# Patient Record
Sex: Male | Born: 1935 | Race: White | Hispanic: No | Marital: Married | Smoking: Former smoker
Health system: Southern US, Community
[De-identification: ages and names within clinical notes are randomized; demographics above are authoritative.]

## PROBLEM LIST (undated history)

## (undated) DIAGNOSIS — R001 Bradycardia, unspecified: Secondary | ICD-10-CM

## (undated) DIAGNOSIS — I4891 Unspecified atrial fibrillation: Secondary | ICD-10-CM

## (undated) DIAGNOSIS — E039 Hypothyroidism, unspecified: Secondary | ICD-10-CM

## (undated) DIAGNOSIS — J479 Bronchiectasis, uncomplicated: Secondary | ICD-10-CM

## (undated) DIAGNOSIS — J449 Chronic obstructive pulmonary disease, unspecified: Secondary | ICD-10-CM

## (undated) DIAGNOSIS — C3410 Malignant neoplasm of upper lobe, unspecified bronchus or lung: Secondary | ICD-10-CM

## (undated) DIAGNOSIS — M549 Dorsalgia, unspecified: Secondary | ICD-10-CM

## (undated) DIAGNOSIS — C3490 Malignant neoplasm of unspecified part of unspecified bronchus or lung: Secondary | ICD-10-CM

## (undated) DIAGNOSIS — M542 Cervicalgia: Secondary | ICD-10-CM

## (undated) DIAGNOSIS — J189 Pneumonia, unspecified organism: Secondary | ICD-10-CM

## (undated) HISTORY — DX: Hypothyroidism, unspecified: E03.9

## (undated) HISTORY — DX: Chronic obstructive pulmonary disease, unspecified: J44.9

## (undated) HISTORY — DX: Unspecified atrial fibrillation: I48.91

## (undated) HISTORY — PX: CATARACT EXTRACTION, BILATERAL: SHX1313

## (undated) HISTORY — DX: Malignant neoplasm of upper lobe, unspecified bronchus or lung: C34.10

## (undated) HISTORY — DX: Cervicalgia: M54.2

## (undated) HISTORY — DX: Dorsalgia, unspecified: M54.9

## (undated) HISTORY — PX: LUNG REMOVAL, PARTIAL: SHX233

## (undated) HISTORY — DX: Bradycardia, unspecified: R00.1

## (undated) HISTORY — DX: Bronchiectasis, uncomplicated: J47.9

## (undated) HISTORY — DX: Malignant neoplasm of unspecified part of unspecified bronchus or lung: C34.90

## (undated) HISTORY — DX: Pneumonia, unspecified organism: J18.9

---

## 1993-09-05 HISTORY — PX: PACEMAKER INSERTION: SHX728

## 2001-03-19 ENCOUNTER — Encounter: Payer: Self-pay | Admitting: Internal Medicine

## 2007-11-14 DIAGNOSIS — Z9889 Other specified postprocedural states: Secondary | ICD-10-CM

## 2007-11-14 HISTORY — DX: Other specified postprocedural states: Z98.890

## 2007-11-14 HISTORY — PX: COLONOSCOPY: SHX174

## 2008-06-20 ENCOUNTER — Encounter: Payer: Self-pay | Admitting: Internal Medicine

## 2009-03-11 ENCOUNTER — Encounter: Payer: Self-pay | Admitting: Internal Medicine

## 2009-09-28 ENCOUNTER — Ambulatory Visit: Payer: Self-pay | Admitting: Internal Medicine

## 2009-09-28 DIAGNOSIS — E785 Hyperlipidemia, unspecified: Secondary | ICD-10-CM | POA: Insufficient documentation

## 2009-09-28 DIAGNOSIS — I495 Sick sinus syndrome: Secondary | ICD-10-CM | POA: Insufficient documentation

## 2009-09-28 DIAGNOSIS — Z950 Presence of cardiac pacemaker: Secondary | ICD-10-CM

## 2009-09-28 DIAGNOSIS — I498 Other specified cardiac arrhythmias: Secondary | ICD-10-CM

## 2009-10-01 ENCOUNTER — Encounter: Payer: Self-pay | Admitting: Internal Medicine

## 2010-02-03 ENCOUNTER — Ambulatory Visit: Payer: Self-pay | Admitting: Cardiology

## 2010-02-03 ENCOUNTER — Encounter: Payer: Self-pay | Admitting: Internal Medicine

## 2010-08-24 ENCOUNTER — Ambulatory Visit: Payer: Self-pay | Admitting: Internal Medicine

## 2010-10-05 NOTE — Letter (Signed)
Summary: PROGRESS NOTES FROM [place]  PROGRESS NOTES FROM [place]   Imported By: Faythe Ghee 09/28/2009 15:00:48  _____________________________________________________________________  External Attachment:    Type:   Image     Comment:   External Document

## 2010-10-05 NOTE — Letter (Signed)
Summary: PFT  PFT   Imported By: Faythe Ghee 09/28/2009 14:59:10  _____________________________________________________________________  External Attachment:    Type:   Image     Comment:   External Document

## 2010-10-05 NOTE — Letter (Signed)
Summary: ECHO  ECHO   Imported By: Faythe Ghee 09/28/2009 14:59:41  _____________________________________________________________________  External Attachment:    Type:   Image     Comment:   External Document

## 2010-10-05 NOTE — Letter (Signed)
Summary: LABS  LABS   Imported By: Faythe Ghee 09/28/2009 15:00:09  _____________________________________________________________________  External Attachment:    Type:   Image     Comment:   External Document

## 2010-10-05 NOTE — Cardiovascular Report (Signed)
Summary: Office Visit   Office Visit   Imported By: Roderic Ovens 02/27/2010 12:02:19  _____________________________________________________________________  External Attachment:    Type:   Image     Comment:   External Document

## 2010-10-05 NOTE — Letter (Signed)
Summary: EKG  EKG   Imported By: Faythe Ghee 09/28/2009 14:57:19  _____________________________________________________________________  External Attachment:    Type:   Image     Comment:   External Document

## 2010-10-05 NOTE — Assessment & Plan Note (Signed)
Summary: **NP6-MOVED FROM [place] NEEDS TO GET PRIMARY HERE   Visit Type:  Initial Consult Primary Provider:  DR.ZACH HALL   History of Present Illness: [name] is referred today by Dr. Margo Aye for evaluation of and ongoing treatment of sinus bradycardia, dyslipidemia, s/p PPM.  He has moved to [facility] from [place], [place].  The patient has a h/o bradycardia and is s/p PPM in 1995.  He had sudden battery depletion and subsequent development of CHF symptoms several yrs ago and now reports that he is doing well.  He denies c/p, sob, or peripheral edema. No cough, weight loss or GI complaints.  He exercises regularly as weather permits.  Current Medications (verified): 1)  Synthroid 100 Mcg Tabs (Levothyroxine Sodium) .... Take 1 Tab Daily 2)  Vitamin E 100 Unit Caps (Vitamin E) .... Take 1 Tab Daily 3)  Vitamin C 100 Mg Tabs (Ascorbic Acid) .... Take 1 Tab Daily 4)  Daily Multiple Vitamins  Tabs (Multiple Vitamin) .... Take 1 Tab Daily 5)  Fish Oil Concentrate 1000 Mg Caps (Omega-3 Fatty Acids) .... Take 1 Cap Daily  Allergies (verified): No Known Drug Allergies  Past History:  Past Medical History: Pancoast tumor, s/p XRT and surgical removal 1990. Dyslipidemia symptomatic brady s/p PPM 1995  Past Surgical History: Lung resection PPM insertion  Family History: N/C  Social History: Married and retired Longstanding tobacco stopped 20 yrs ago No etoH abuse.  Review of Systems       all systems reviewed and negative except as noted in the HPI.  Vital Signs:  Patient profile:   77 year old male Height:      73 inches Weight:      192 pounds BMI:     25.42 Pulse rate:   77 / minute BP sitting:   133 / 82  (right arm)  Vitals Entered By: Dreama Saa, CNA (September 28, 2009 9:29 AM)  Physical Exam  General:  Well developed, well nourished, in no acute distress.  Looks younger than stated age. HEENT: normal Neck: supple. No JVD. Carotids 2+ bilaterally no  bruits Cor: RRR no rubs, gallops or murmur Lungs: CTA. No wheezes, rales, or rhonchi Well healed right sided thoracotomy incision. Ab: soft, nontender. nondistended. No HSM. Good bowel sounds Ext: warm. no cyanosis, clubbing or edema Neuro: alert and oriented. Grossly nonfocal. affect pleasant    PPM Specifications Following MD:  Lewayne Bunting, MD     PPM Vendor:  Medtronic     PPM Model Number:  [id number]     PPM Serial Number:  [id number] H PPM DOI:  03/19/2009     PPM Implanting MD:  NOT IMPLANTED BY [place]  Lead 1    Location: RV     DOI: 01/28/1994     Model #: [id number]     Status: active Lead 2    Location: RA     DOI: 01/28/1994     Model #: 1222T     Status: active  Magnet Response Rate:  BOL 85 ERI  65  Indications:  Sick sinus syndrome   PPM Follow Up Remote Check?  No Battery Voltage:  2.8 V     Battery Est. Longevity:  8 years     Pacer Dependent:  Yes       PPM Device Measurements Atrium  Amplitude: 2.8 mV, Impedance: 610 ohms, Threshold: 1.125 V at 0.4 msec Right Ventricle  Amplitude: 5.6 mV, Impedance: 546 ohms, Threshold: 1.25 V at 1.0 msec  Episodes MS  Episodes:  31     Percent Mode Switch:  <0.1%     Coumadin:  No Ventricular High Rate:  1     Atrial Pacing:  89.8%     Ventricular Pacing:  3.5%  Parameters Mode:  DDDR+     Lower Rate Limit:  60     Upper Rate Limit:  130 Paced AV Delay:  150     Sensed AV Delay:  120 Next Cardiology Appt Due:  12/04/2009 Tech Comments:  RV 2.5@1 .0.  No Carelink @ this time.  Device function normal.  ROV 3 months [facility] clinic. Altha Harm, LPN  September 28, 2009 9:52 AM  MD Comments:  Agree with above.  Impression & Recommendations:  Problem # 1:  CARDIAC PACEMAKER IN SITU (ICD-V45.01) His device is working normally today.  Will recheck in several months.  Problem # 2:  DYSLIPIDEMIA (ICD-272.4) He is not on any cholesterol lowering meds.  Continue a low fat diet.

## 2010-10-05 NOTE — Procedures (Signed)
Summary: 3 MTH F/U PER CHECKOUT ON 09/28/09/TG   Current Medications (verified): 1)  Synthroid 100 Mcg Tabs (Levothyroxine Sodium) .... Take 1 Tab Daily 2)  Vitamin E 100 Unit Caps (Vitamin E) .... Take 1 Tab Daily 3)  Vitamin C 100 Mg Tabs (Ascorbic Acid) .... Take 1 Tab Daily 4)  Daily Multiple Vitamins  Tabs (Multiple Vitamin) .... Take 1 Tab Daily 5)  Fish Oil Concentrate 1000 Mg Caps (Omega-3 Fatty Acids) .... Take 1 Cap Daily  Allergies (verified): No Known Drug Allergies   PPM Specifications Following MD:  Lewayne Bunting, MD     PPM Vendor:  Medtronic     PPM Model Number:  [id number]     PPM Serial Number:  [id number] H PPM DOI:  03/19/2009     PPM Implanting MD:  NOT IMPLANTED BY [place]  Lead 1    Location: RV     DOI: 01/28/1994     Model #: [id number]     Status: active Lead 2    Location: RA     DOI: 01/28/1994     Model #: [id number]     Status: active  Magnet Response Rate:  BOL 85 ERI  65  Indications:  Sick sinus syndrome   PPM Follow Up Remote Check?  No Battery Voltage:  2.80 V     Battery Est. Longevity:  9.5 years     Pacer Dependent:  Yes       PPM Device Measurements Atrium  Amplitude: 4.0 mV, Impedance: 641 ohms, Threshold: 1.0 V at 0.4 msec Right Ventricle  Amplitude: 5.6 mV, Impedance: 529 ohms, Threshold: 1.75 V at 0.76 msec  Episodes MS Episodes:  21     Percent Mode Switch:  <0.1%     Coumadin:  No Ventricular High Rate:  3     Atrial Pacing:  86%     Ventricular Pacing:  4.5%  Parameters Mode:  DDDR+     Lower Rate Limit:  60     Upper Rate Limit:  130 Paced AV Delay:  150     Sensed AV Delay:  120 Next Cardiology Appt Due:  08/05/2010 Tech Comments:  Outputs reprogrammed for threshold values.  The longest a-fib episode lasted 1:07 hours, - coumadin.  3 VHR episodes lasting up to 4 seconds.  No Carelink @ this time.  ROV 6 months with Dr. Ladona Ridgel in [facility]. Altha Harm, LPN  February 03, 4097 9:40 AM

## 2010-10-07 NOTE — Procedures (Signed)
Summary: Robert [name]   Primary Provider:  DR.ZACH HALL  CC:  No cardiac complaints at this time.Marland Kitchen  History of Present Illness: Robert [name] returns today for followup.  He is a pleasant 77 yo man with a h/o symptomatic bradycardia who underwent PPM initially in 1995.  The patient underwent generator change in 2010.  No other complaints today.  He remains active without chest pain or sob.  Preventive Screening-Counseling & Management  Alcohol-Tobacco     Smoking Status: never  Current Medications (verified): 1)  Synthroid 100 Mcg Tabs (Levothyroxine Sodium) .... Take 1 Tab Daily 2)  Daily Multiple Vitamins  Tabs (Multiple Vitamin) .... Take 1 Tab Daily 3)  Fish Oil Concentrate 1000 Mg Caps (Omega-3 Fatty Acids) .... Take 1 Cap Daily  Allergies (verified): No Known Drug Allergies  Past History:  Past Medical History: Last updated: 09/28/2009 Pancoast tumor, s/p XRT and surgical removal 1990. Dyslipidemia symptomatic brady s/p PPM 1995  Past Surgical History: Last updated: 09/28/2009 Lung resection PPM insertion BILATERIAL CATARACT SURGERY  Social History: Smoking Status:  never  Review of Systems  The patient denies chest pain, syncope, dyspnea on exertion, and peripheral edema.    Vital Signs:  Patient profile:   77 year old male Height:      72 inches Weight:      202 pounds BMI:     27.50 O2 Sat:      95 % on Room air Pulse rate:   76 / minute BP sitting:   127 / 79  (left arm)  Vitals Entered ByLarita Fife Via LPN (August 24, 2010 3:24 PM)  O2 Flow:  Room air  Physical Exam  General:  Well developed, well nourished, in no acute distress.  Looks younger than stated age. HEENT: normal Neck: supple. No JVD. Carotids 2+ bilaterally no bruits Cor: RRR no rubs, gallops or murmur Lungs: CTA. No wheezes, rales, or rhonchi Well healed right sided thoracotomy incision. Ab: soft, nontender. nondistended. No HSM. Good bowel sounds Ext: warm. no cyanosis, clubbing or  edema Neuro: alert and oriented. Grossly nonfocal. affect pleasant    PPM Specifications Following MD:  Lewayne Bunting, MD     PPM Vendor:  Medtronic     PPM Model Number:  ADDRO1     PPM Serial Number:  [id number] H PPM DOI:  03/19/2009     PPM Implanting MD:  NOT IMPLANTED BY [place]  Lead 1    Location: RV     DOI: 01/28/1994     Model #: 1226T     Status: active Lead 2    Location: RA     DOI: 01/28/1994     Model #: 1222T     Status: active  Magnet Response Rate:  BOL 85 ERI  65  Indications:  Sick sinus syndrome   PPM Follow Up Remote Check?  No Battery Voltage:  2.80 V     Battery Est. Longevity:  10.5 years     Pacer Dependent:  Yes       PPM Device Measurements Atrium  Amplitude: 4.0 mV, Impedance: 619 ohms, Threshold: 1.125 V at 0.4 msec Right Ventricle  Amplitude: 5.6 mV, Impedance: 521 ohms, Threshold: 1.5 V at 1.0 msec  Episodes MS Episodes:  31     Percent Mode Switch:  <0.1%     Coumadin:  No Ventricular High Rate:  4     Atrial Pacing:  94.8%     Ventricular Pacing:  2.6%  Parameters Mode:  DDDR+  Lower Rate Limit:  60     Upper Rate Limit:  130 Paced AV Delay:  150     Sensed AV Delay:  120 Next Cardiology Appt Due:  02/04/2011 Tech Comments:  RV reprogrammed 3.0@1 .0 for increased ventricular threshold.  Device function normal.  No Carelink @ this time.  ROV 6 months [facility] clinic. Altha Harm, LPN  August 24, 2010 4:21 PM  MD Comments:  Agree with above.  Impression & Recommendations:  Problem # 1:  CARDIAC PACEMAKER IN SITU (ICD-V45.01) His device is working well thought his RV threshold is increased.  Will follow as he is not pacing much in the RV.  Problem # 2:  DYSLIPIDEMIA (ICD-272.4) I have asked him to continue exercising regularly and maintain a low fat diet.  Prevention & Chronic Care Immunizations   Influenza vaccine: Not documented    Tetanus booster: Not documented    Pneumococcal vaccine: Not documented    H. zoster vaccine: Not  documented  Colorectal Screening   Hemoccult: Not documented    Colonoscopy: Not documented  Other Screening   PSA: Not documented   Smoking status: never  (08/24/2010)  Lipids   Total Cholesterol: Not documented   LDL: Not documented   LDL Direct: Not documented   HDL: Not documented   Triglycerides: Not documented    SGOT (AST): Not documented   SGPT (ALT): Not documented   Alkaline phosphatase: Not documented   Total bilirubin: Not documented  Self-Management Support :    Lipid self-management support: Not documented

## 2010-10-07 NOTE — Cardiovascular Report (Signed)
Summary: Office Visit   Office Visit   Imported By: Roderic Ovens 09/01/2010 11:23:39  _____________________________________________________________________  External Attachment:    Type:   Image     Comment:   External Document

## 2010-10-09 ENCOUNTER — Encounter: Payer: Self-pay | Admitting: Internal Medicine

## 2010-10-13 NOTE — Miscellaneous (Signed)
Summary: corrected device information  Clinical Lists Changes  Observations: Added new observation of PPMLEADSER2: [id number]  (10/09/2010 10:07) Added new observation of PPMLEADSER1: [id number]  (10/09/2010 10:07)      PPM Specifications Following MD:  Lewayne Bunting, MD     PPM Vendor:  Medtronic     PPM Model Number:  ADDRO1     PPM Serial Number:  [id number] H PPM DOI:  03/19/2009     PPM Implanting MD:  NOT IMPLANTED BY [place]  Lead 1    Location: RV     DOI: 01/28/1994     Model #: 1226T     Serial #: [id number]     Status: active Lead 2    Location: RA     DOI: 01/28/1994     Model #: [id number]     Serial #: [id number]     Status: active  Magnet Response Rate:  BOL 85 ERI  65  Indications:  Sick sinus syndrome   PPM Follow Up Pacer Dependent:  Yes      Episodes Coumadin:  No  Parameters Mode:  DDDR+     Lower Rate Limit:  60     Upper Rate Limit:  130 Paced AV Delay:  150     Sensed AV Delay:  120

## 2011-04-21 ENCOUNTER — Ambulatory Visit (INDEPENDENT_AMBULATORY_CARE_PROVIDER_SITE_OTHER): Payer: BC Managed Care – PPO | Admitting: *Deleted

## 2011-04-21 DIAGNOSIS — I495 Sick sinus syndrome: Secondary | ICD-10-CM

## 2011-08-23 ENCOUNTER — Encounter: Payer: Self-pay | Admitting: Internal Medicine

## 2011-09-08 ENCOUNTER — Encounter: Payer: Self-pay | Admitting: Internal Medicine

## 2011-10-13 ENCOUNTER — Encounter: Payer: Self-pay | Admitting: Internal Medicine

## 2011-10-13 ENCOUNTER — Ambulatory Visit (INDEPENDENT_AMBULATORY_CARE_PROVIDER_SITE_OTHER): Payer: BC Managed Care – PPO | Admitting: Internal Medicine

## 2011-10-13 VITALS — BP 116/75 | HR 68 | Ht 73.0 in | Wt 202.0 lb

## 2011-10-13 DIAGNOSIS — E785 Hyperlipidemia, unspecified: Secondary | ICD-10-CM

## 2011-10-13 DIAGNOSIS — Z950 Presence of cardiac pacemaker: Secondary | ICD-10-CM

## 2011-10-13 DIAGNOSIS — I498 Other specified cardiac arrhythmias: Secondary | ICD-10-CM

## 2011-10-13 LAB — PACEMAKER DEVICE OBSERVATION
AL AMPLITUDE: 4 mv
BAMS-0001: 175 {beats}/min
RV LEAD AMPLITUDE: 5.6 mv
RV LEAD THRESHOLD: 1.5 V

## 2011-10-13 NOTE — Assessment & Plan Note (Signed)
His device is working normally. We'll plan to recheck in several months. 

## 2011-10-13 NOTE — Assessment & Plan Note (Signed)
He is instructed to continue his current medical therapy and maintain a low-fat diet.

## 2011-10-13 NOTE — Progress Notes (Signed)
HPI Mr. Dileonardo returns today for followup. He is a very pleasant 77 -year-old man with a history of symptomatic bradycardia status post permanent pacemaker insertion. He has a history of lung cancer status post resection. In the interim, he has done well. He denies chest pain, shortness of breath, or peripheral edema. He remains active walking almost daily. No syncope. No Known Allergies   Current Outpatient Prescriptions  Medication Sig Dispense Refill  . celecoxib (CELEBREX) 200 MG capsule Take 200 mg by mouth as needed.        . fish oil-omega-3 fatty acids 1000 MG capsule Take 1 capsule by mouth daily.        Marland Kitchen levothyroxine (SYNTHROID, LEVOTHROID) 100 MCG tablet Take 100 mcg by mouth daily.        . Multiple Vitamin (MULTIVITAMIN) tablet Take 1 tablet by mouth daily.           Past Medical History  Diagnosis Date  . 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s/p XRT and surgical removal 1990  . Dyslipidemia   . Symptomatic bradycardia     s/p PPM 1995    ROS:   All systems reviewed and negative except as noted in the HPI.   Past Surgical History  Procedure Date  . Lung removal, partial   . Pacemaker insertion 1995  . Cataract extraction, bilateral      Family History  Problem Relation Age of Onset  . Arrhythmia Father     Atrial fibrillation  . Arrhythmia Sister     Hx of atrial myxoma  . Diabetes Brother     Insulin dependent  . Breast cancer Sister   . Breast cancer Sister      History   Social History  . Marital Status: Married    Spouse Name: N/A    Number of Children: N/A  . Years of Education: N/A   Occupational History  . Retired    Social History Main Topics  . Smoking status: Former Smoker    Quit date: 09/05/1988  . Smokeless tobacco: Not on file  . Alcohol Use: 3.5 oz/week    7 drink(s) per week     1 drink daily  . Drug Use: Not on file  . Sexually Active: Not on file   Other Topics Concern  . Not on file   Social History Narrative   Married      BP 116/75  Pulse 68  Ht 6\' 1"  (1.854 m)  Wt 91.627 kg (202 lb)  BMI 26.65 kg/m2  Physical Exam:  Well appearing 78 year old man, NAD HEENT: Unremarkable Neck:  No JVD, no thyromegally Lungs:  Clear with no wheezes, rales, or rhonchi. Well-healed pacemaker incision. HEART:  Regular rate rhythm, no murmurs, no rubs, no clicks Abd:  soft, positive bowel sounds, no organomegally, no rebound, no guarding Ext:  2 plus pulses, no edema, no cyanosis, no clubbing Skin:  No rashes no nodules Neuro:  CN II through XII intact, motor grossly intact  DEVICE  Normal device function.  See PaceArt for details.   Assess/Plan:

## 2011-10-13 NOTE — Patient Instructions (Signed)
Your physician recommends that you schedule a follow-up appointment in: 12 months.  

## 2012-05-18 ENCOUNTER — Encounter: Payer: Self-pay | Admitting: *Deleted

## 2012-05-30 ENCOUNTER — Ambulatory Visit (INDEPENDENT_AMBULATORY_CARE_PROVIDER_SITE_OTHER): Payer: BC Managed Care – PPO | Admitting: *Deleted

## 2012-05-30 ENCOUNTER — Encounter: Payer: Self-pay | Admitting: Internal Medicine

## 2012-05-30 DIAGNOSIS — I498 Other specified cardiac arrhythmias: Secondary | ICD-10-CM

## 2012-05-30 DIAGNOSIS — Z950 Presence of cardiac pacemaker: Secondary | ICD-10-CM

## 2012-05-30 LAB — PACEMAKER DEVICE OBSERVATION
AL AMPLITUDE: 2 mv
AL IMPEDENCE PM: 651 Ohm
BATTERY VOLTAGE: 2.8 V
RV LEAD AMPLITUDE: 8 mv
RV LEAD IMPEDENCE PM: 544 Ohm
VENTRICULAR PACING PM: 1

## 2012-05-30 NOTE — Progress Notes (Signed)
Pacer check in clinic  

## 2012-07-05 ENCOUNTER — Telehealth: Payer: Self-pay | Admitting: Internal Medicine

## 2012-07-05 NOTE — Telephone Encounter (Signed)
Pt wife is calling, states that at last vist to have device checked christen told them he would need to see a doctor that they might want to to start him on coumadin for aflutter. She was told that he would get a letter. They never received any letter

## 2012-07-06 ENCOUNTER — Telehealth: Payer: Self-pay | Admitting: Pharmacist

## 2012-07-06 ENCOUNTER — Telehealth: Payer: Self-pay | Admitting: *Deleted

## 2012-07-06 MED ORDER — WARFARIN SODIUM 5 MG PO TABS: 5.0000 mg | ORAL_TABLET | Freq: Every day | ORAL | Status: DC

## 2012-07-06 NOTE — Telephone Encounter (Signed)
See telephone note in regards to pt starting Coumadin/kwm

## 2012-07-06 NOTE — Telephone Encounter (Signed)
Per note from last check----pt to start Coumadin and set up appt with GT in 4-6 weeks in [facility] office. Pt's wife aware of this. Kennon Rounds to call pt and get started on Coumadin and Melissa to call and schedule appt with GT.

## 2012-07-06 NOTE — Telephone Encounter (Signed)
Called and spoke with pt's wife.  We will start Coumadin today per Dr. Ladona Ridgel for atrial flutter and then set pt up in the [place] office next Wednesday.

## 2012-07-11 ENCOUNTER — Ambulatory Visit (INDEPENDENT_AMBULATORY_CARE_PROVIDER_SITE_OTHER): Payer: BC Managed Care – PPO | Admitting: *Deleted

## 2012-07-11 ENCOUNTER — Encounter: Payer: Self-pay | Admitting: Internal Medicine

## 2012-07-11 DIAGNOSIS — I4892 Unspecified atrial flutter: Secondary | ICD-10-CM

## 2012-07-11 DIAGNOSIS — I4891 Unspecified atrial fibrillation: Secondary | ICD-10-CM | POA: Insufficient documentation

## 2012-07-11 DIAGNOSIS — Z7901 Long term (current) use of anticoagulants: Secondary | ICD-10-CM

## 2012-07-11 LAB — POCT INR: INR: 1.5

## 2012-07-18 ENCOUNTER — Ambulatory Visit (INDEPENDENT_AMBULATORY_CARE_PROVIDER_SITE_OTHER): Payer: BC Managed Care – PPO | Admitting: *Deleted

## 2012-07-18 DIAGNOSIS — Z7901 Long term (current) use of anticoagulants: Secondary | ICD-10-CM

## 2012-07-18 DIAGNOSIS — I4892 Unspecified atrial flutter: Secondary | ICD-10-CM

## 2012-07-18 LAB — POCT INR: INR: 2.9

## 2012-07-25 ENCOUNTER — Ambulatory Visit (INDEPENDENT_AMBULATORY_CARE_PROVIDER_SITE_OTHER): Payer: BC Managed Care – PPO | Admitting: *Deleted

## 2012-07-25 DIAGNOSIS — Z7901 Long term (current) use of anticoagulants: Secondary | ICD-10-CM

## 2012-07-25 DIAGNOSIS — I4892 Unspecified atrial flutter: Secondary | ICD-10-CM

## 2012-07-27 ENCOUNTER — Other Ambulatory Visit: Payer: Self-pay | Admitting: Pharmacist

## 2012-07-27 MED ORDER — WARFARIN SODIUM 5 MG PO TABS: 5.0000 mg | ORAL_TABLET | Freq: Every day | ORAL | Status: DC

## 2012-08-06 ENCOUNTER — Ambulatory Visit (INDEPENDENT_AMBULATORY_CARE_PROVIDER_SITE_OTHER): Payer: BC Managed Care – PPO | Admitting: *Deleted

## 2012-08-06 DIAGNOSIS — I4892 Unspecified atrial flutter: Secondary | ICD-10-CM

## 2012-08-06 DIAGNOSIS — Z7901 Long term (current) use of anticoagulants: Secondary | ICD-10-CM

## 2012-08-13 ENCOUNTER — Ambulatory Visit (INDEPENDENT_AMBULATORY_CARE_PROVIDER_SITE_OTHER): Payer: BC Managed Care – PPO | Admitting: *Deleted

## 2012-08-13 ENCOUNTER — Ambulatory Visit (INDEPENDENT_AMBULATORY_CARE_PROVIDER_SITE_OTHER): Payer: BC Managed Care – PPO | Admitting: Internal Medicine

## 2012-08-13 ENCOUNTER — Encounter: Payer: Self-pay | Admitting: Internal Medicine

## 2012-08-13 VITALS — BP 114/74 | HR 78 | Ht 73.0 in | Wt 205.8 lb

## 2012-08-13 DIAGNOSIS — Z950 Presence of cardiac pacemaker: Secondary | ICD-10-CM

## 2012-08-13 DIAGNOSIS — Z7901 Long term (current) use of anticoagulants: Secondary | ICD-10-CM

## 2012-08-13 DIAGNOSIS — I4892 Unspecified atrial flutter: Secondary | ICD-10-CM

## 2012-08-13 LAB — POCT INR: INR: 3.1

## 2012-08-13 LAB — PACEMAKER DEVICE OBSERVATION
AL IMPEDENCE PM: 608 Ohm
ATRIAL PACING PM: 96
BAMS-0001: 175 {beats}/min
BATTERY VOLTAGE: 2.8 V
RV LEAD THRESHOLD: 2.25 V
VENTRICULAR PACING PM: 1

## 2012-08-13 NOTE — Assessment & Plan Note (Signed)
He is been out of rhythm, either fibrillation or flutter, less than 0.1% of the time. He is asymptomatic. He will continue chronic anticoagulation. No rate controlling drugs are needed at this time.

## 2012-08-13 NOTE — Patient Instructions (Signed)
Your physician recommends that you schedule a follow-up appointment in: 1 year with Dr Ladona Ridgel and 6 months with Gunnar Fusi

## 2012-08-13 NOTE — Assessment & Plan Note (Signed)
His Medtronic dual-chamber pacemaker is working satisfactorily. His ventricular pacing threshold remains chronically elevated. Today we preprogram his device to assure satisfactory safety margins.

## 2012-08-13 NOTE — Progress Notes (Signed)
HPI Mr. Crysler returns today for followup. He is a very pleasant 78 year old man with symptomatic bradycardia, status post permanent pacemaker insertion. He has sinus node dysfunction. He also is paroxysmal atrial fibrillation, which has been very well controlled. He denies chest pain or shortness of breath. No weight loss. He notes that he is actually gained several pounds. No peripheral edema, cough, or syncope. No Known Allergies   Current Outpatient Prescriptions  Medication Sig Dispense Refill  . celecoxib (CELEBREX) 200 MG capsule Take 200 mg by mouth as needed.        Marland Kitchen levothyroxine (SYNTHROID, LEVOTHROID) 100 MCG tablet Take 100 mcg by mouth daily.        . Multiple Vitamin (MULTIVITAMIN) tablet Take 1 tablet by mouth daily.        . vitamin C (ASCORBIC ACID) 500 MG tablet Take 500 mg by mouth daily.      Marland Kitchen warfarin (COUMADIN) 5 MG tablet Take 1 tablet (5 mg total) by mouth daily.  84 tablet  0     Past Medical History  Diagnosis Date  . 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s/p XRT and surgical removal 1990  . Dyslipidemia   . Symptomatic bradycardia     s/p PPM 1995    ROS:   All systems reviewed and negative except as noted in the HPI.   Past Surgical History  Procedure Date  . Lung removal, partial   . Pacemaker insertion 1995  . Cataract extraction, bilateral      Family History  Problem Relation Age of Onset  . Arrhythmia Father     Atrial fibrillation  . Diabetes Brother     Insulin dependent  . Breast cancer Sister   . Breast cancer Sister      History   Social History  . Marital Status: Married    Spouse Name: N/A    Number of Children: N/A  . Years of Education: N/A   Occupational History  . Retired    Social History Main Topics  . Smoking status: Former Smoker    Quit date: 09/05/1988  . Smokeless tobacco: Not on file  . Alcohol Use: 3.5 oz/week    7 drink(s) per week     Comment: 1 drink daily  . Drug Use: Not on file  . Sexually Active: Not on  file   Other Topics Concern  . Not on file   Social History Narrative   Married     BP 114/74  Pulse 78  Ht 6\' 1"  (1.854 m)  Wt 205 lb 12.8 oz (93.35 kg)  BMI 27.15 kg/m2  SpO2 98%  Physical Exam:  Well appearing 78 year old man, NAD HEENT: Unremarkable Neck:  No JVD, no thyromegally Lungs:  Clear with no wheezes, rales, or rhonchi. HEART:  Regular rate rhythm, no murmurs, no rubs, no clicks Abd:  soft, positive bowel sounds, no organomegally, no rebound, no guarding Ext:  2 plus pulses, no edema, no cyanosis, no clubbing Skin:  No rashes no nodules Neuro:  CN II through XII intact, motor grossly intact  DEVICE  Normal device function.  See PaceArt for details.   Assess/Plan:

## 2012-08-14 ENCOUNTER — Encounter: Payer: Self-pay | Admitting: Internal Medicine

## 2012-08-23 ENCOUNTER — Encounter: Payer: Self-pay | Admitting: Internal Medicine

## 2012-08-27 ENCOUNTER — Telehealth: Payer: Self-pay | Admitting: Internal Medicine

## 2012-08-27 ENCOUNTER — Ambulatory Visit (INDEPENDENT_AMBULATORY_CARE_PROVIDER_SITE_OTHER): Payer: BC Managed Care – PPO | Admitting: *Deleted

## 2012-08-27 DIAGNOSIS — Z7901 Long term (current) use of anticoagulants: Secondary | ICD-10-CM

## 2012-08-27 DIAGNOSIS — I4892 Unspecified atrial flutter: Secondary | ICD-10-CM

## 2012-08-27 NOTE — Telephone Encounter (Signed)
Spoke with pt.  He is to continue same coumadin dose as instructed this morning and keep INR check on 1 /13.  May have extra greens/salads while on Amoxicillin

## 2012-08-27 NOTE — Telephone Encounter (Signed)
PT WAS PLACED ON AMOXICILLIN

## 2012-09-17 ENCOUNTER — Ambulatory Visit (INDEPENDENT_AMBULATORY_CARE_PROVIDER_SITE_OTHER): Payer: BC Managed Care – PPO | Admitting: *Deleted

## 2012-09-17 DIAGNOSIS — I4892 Unspecified atrial flutter: Secondary | ICD-10-CM

## 2012-09-17 DIAGNOSIS — Z7901 Long term (current) use of anticoagulants: Secondary | ICD-10-CM

## 2012-09-17 LAB — POCT INR: INR: 1.6

## 2012-10-03 ENCOUNTER — Ambulatory Visit (INDEPENDENT_AMBULATORY_CARE_PROVIDER_SITE_OTHER): Payer: BC Managed Care – PPO | Admitting: *Deleted

## 2012-10-03 DIAGNOSIS — I4892 Unspecified atrial flutter: Secondary | ICD-10-CM

## 2012-10-03 DIAGNOSIS — Z7901 Long term (current) use of anticoagulants: Secondary | ICD-10-CM

## 2012-10-04 ENCOUNTER — Encounter: Payer: Self-pay | Admitting: Cardiology

## 2012-10-22 ENCOUNTER — Telehealth: Payer: Self-pay | Admitting: Internal Medicine

## 2012-10-22 NOTE — Telephone Encounter (Signed)
PT WIFE IS CALLING TO SEE IF YOU COULD GET WITH DR Ladona Ridgel AND SEE IF HE CAN BE PUT ON "OTHER PILLS" THEN COUMADIN.  IF WE COULD CALL THEM IN TO [facility] MAIL ORDER [phone]

## 2012-10-23 NOTE — Telephone Encounter (Signed)
Pt want to stop coumadin and start Xarelto or Eliquis.  Please advise.

## 2012-10-23 NOTE — Telephone Encounter (Signed)
Ok to start xarelto 20 mg daily, taken at night, three days after stopping coumadin.

## 2012-10-24 ENCOUNTER — Ambulatory Visit (INDEPENDENT_AMBULATORY_CARE_PROVIDER_SITE_OTHER): Payer: BC Managed Care – PPO | Admitting: *Deleted

## 2012-10-24 DIAGNOSIS — I4892 Unspecified atrial flutter: Secondary | ICD-10-CM

## 2012-10-24 DIAGNOSIS — Z7901 Long term (current) use of anticoagulants: Secondary | ICD-10-CM

## 2012-10-24 MED ORDER — RIVAROXABAN 20 MG PO TABS: 20.0000 mg | ORAL_TABLET | Freq: Every day | ORAL | Status: DC

## 2012-11-21 ENCOUNTER — Ambulatory Visit (INDEPENDENT_AMBULATORY_CARE_PROVIDER_SITE_OTHER): Payer: BC Managed Care – PPO | Admitting: *Deleted

## 2012-11-21 DIAGNOSIS — Z7901 Long term (current) use of anticoagulants: Secondary | ICD-10-CM

## 2012-11-21 DIAGNOSIS — I4892 Unspecified atrial flutter: Secondary | ICD-10-CM

## 2013-01-01 ENCOUNTER — Telehealth: Payer: Self-pay | Admitting: *Deleted

## 2013-01-01 NOTE — Telephone Encounter (Signed)
Spoke with wife.  Pt is having some deep cleaning done by endodontist.  States he did not seem concerned about pt being on Xarelto.  Agreed it should be OK to have minor procedure on Xarelto but could hold the night before procedure if necessary.  She verbalized understanding.

## 2013-01-01 NOTE — Telephone Encounter (Signed)
Wants to know if patient needs to stop Coumadin prior to dental procedure on Thursday. / tgs

## 2013-01-11 ENCOUNTER — Other Ambulatory Visit: Payer: Self-pay | Admitting: Internal Medicine

## 2013-01-11 NOTE — Telephone Encounter (Signed)
Fax Received. Refill Completed. Maycen Degregory Chowoe (R.M.A)   

## 2013-02-07 ENCOUNTER — Ambulatory Visit (INDEPENDENT_AMBULATORY_CARE_PROVIDER_SITE_OTHER): Payer: BC Managed Care – PPO | Admitting: Cardiology

## 2013-02-07 ENCOUNTER — Encounter: Payer: Self-pay | Admitting: Cardiology

## 2013-02-07 ENCOUNTER — Encounter: Payer: Self-pay | Admitting: Internal Medicine

## 2013-02-07 VITALS — Ht 73.0 in | Wt 200.4 lb

## 2013-02-07 DIAGNOSIS — Z950 Presence of cardiac pacemaker: Secondary | ICD-10-CM

## 2013-02-07 DIAGNOSIS — I495 Sick sinus syndrome: Secondary | ICD-10-CM

## 2013-02-07 DIAGNOSIS — I480 Paroxysmal atrial fibrillation: Secondary | ICD-10-CM

## 2013-02-07 LAB — PACEMAKER DEVICE OBSERVATION
AL THRESHOLD: 0.75 V
ATRIAL PACING PM: 96.5
BAMS-0001: 175 {beats}/min
RV LEAD THRESHOLD: 2 V
RV LEAD THRESHOLD: 2.25 V
VENTRICULAR PACING PM: 1.4

## 2013-02-07 NOTE — Patient Instructions (Addendum)
Please stay on track with scheduled follow up with Dr. Ladona Ridgel. You should receive a reminder letter in the mail to call to schedule your 6 month follow-up.  Your physician recommends that you continue on your current medications as directed. Please refer to the Current Medication list given to you today.

## 2013-02-07 NOTE — Progress Notes (Signed)
PPM check in device clinic. [name] has no complaints. Normal pacemaker function. No programming changes made. See PaceArt report.

## 2013-05-17 ENCOUNTER — Ambulatory Visit: Payer: Self-pay | Admitting: *Deleted

## 2013-05-17 DIAGNOSIS — I4892 Unspecified atrial flutter: Secondary | ICD-10-CM

## 2013-05-17 DIAGNOSIS — Z7901 Long term (current) use of anticoagulants: Secondary | ICD-10-CM

## 2013-08-12 ENCOUNTER — Encounter: Payer: Self-pay | Admitting: Internal Medicine

## 2013-08-12 ENCOUNTER — Ambulatory Visit (INDEPENDENT_AMBULATORY_CARE_PROVIDER_SITE_OTHER): Payer: BC Managed Care – PPO | Admitting: Internal Medicine

## 2013-08-12 VITALS — BP 141/82 | HR 84 | Ht 73.0 in | Wt 201.0 lb

## 2013-08-12 DIAGNOSIS — Z7901 Long term (current) use of anticoagulants: Secondary | ICD-10-CM

## 2013-08-12 DIAGNOSIS — I498 Other specified cardiac arrhythmias: Secondary | ICD-10-CM

## 2013-08-12 DIAGNOSIS — I4892 Unspecified atrial flutter: Secondary | ICD-10-CM

## 2013-08-12 DIAGNOSIS — Z950 Presence of cardiac pacemaker: Secondary | ICD-10-CM

## 2013-08-12 LAB — MDC_IDC_ENUM_SESS_TYPE_INCLINIC
Battery Remaining Longevity: 90 mo
Battery Voltage: 2.8 V
Brady Statistic AP VP Percent: 3 %
Brady Statistic AS VP Percent: 0 %
Lead Channel Pacing Threshold Amplitude: 1 V
Lead Channel Sensing Intrinsic Amplitude: 4 mV
Lead Channel Setting Pacing Amplitude: 2 V
Lead Channel Setting Pacing Amplitude: 2.5 V
Lead Channel Setting Pacing Pulse Width: 0.64 ms

## 2013-08-12 NOTE — Assessment & Plan Note (Signed)
He has had a little bit of  Bruising but overall no problem. Will follow.

## 2013-08-12 NOTE — Assessment & Plan Note (Signed)
His PAF is well controlled. He is out of rhythm approx. 3.5% of the time. He will continue his current medical therapy.

## 2013-08-12 NOTE — Assessment & Plan Note (Signed)
His medtronic DDD PM is working normally. Will recheck in several months. 

## 2013-08-12 NOTE — Patient Instructions (Addendum)
Your physician recommends that you schedule a follow-up appointment in: 1 year with Dr Taylor and 6 months with Paula You will receive a reminder letter two months in advance reminding you to call and schedule your appointment. If you don't receive this letter, please contact our office.    

## 2013-08-12 NOTE — Progress Notes (Signed)
HPI Robert [name] returns today for followup. He is a very pleasant 79 year old man with symptomatic bradycardia, status post permanent pacemaker insertion. He has sinus node dysfunction. He also is paroxysmal atrial fibrillation, which has been very well controlled. He denies chest pain or shortness of breath. No weight loss. He notes that he is actually gained several pounds. No peripheral edema, cough, or syncope. No Known Allergies   Current Outpatient Prescriptions  Medication Sig Dispense Refill  . celecoxib (CELEBREX) 200 MG capsule Take 200 mg by mouth as needed.        Marland Kitchen levothyroxine (SYNTHROID, LEVOTHROID) 100 MCG tablet Take 100 mcg by mouth daily.        . Multiple Vitamin (MULTIVITAMIN) tablet Take 1 tablet by mouth daily.        . vitamin C (ASCORBIC ACID) 500 MG tablet Take 500 mg by mouth daily.      [name] 20 MG TABS TAKE 1 TABLET BY MOUTH DAILY WITH SUPPER  90 tablet  3   No current facility-administered medications for this visit.     Past Medical History  Diagnosis Date  . 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s/p XRT and surgical removal 1990  . Dyslipidemia   . Symptomatic bradycardia     s/p PPM 1995    ROS:   All systems reviewed and negative except as noted in the HPI.   Past Surgical History  Procedure Laterality Date  . Lung removal, partial    . Pacemaker insertion  1995  . Cataract extraction, bilateral       Family History  Problem Relation Age of Onset  . Arrhythmia Father     Atrial fibrillation  . Diabetes Brother     Insulin dependent  . Breast cancer Sister   . Breast cancer Sister      History   Social History  . Marital Status: Married    Spouse Name: N/A    Number of Children: N/A  . Years of Education: N/A   Occupational History  . Retired    Social History Main Topics  . Smoking status: Former Smoker    Quit date: 09/05/1988  . Smokeless tobacco: Not on file  . Alcohol Use: 3.5 oz/week    7 drink(s) per week     Comment: 1 drink  daily  . Drug Use: Not on file  . Sexual Activity: Not on file   Other Topics Concern  . Not on file   Social History Narrative   Married     BP 141/82  Pulse 84  Ht 6\' 1"  (1.854 m)  Wt 201 lb (91.173 kg)  BMI 26.52 kg/m2  Physical Exam:  Well appearing 79 year old man, NAD HEENT: Unremarkable Neck:  No JVD, no thyromegally Lungs:  Clear with no wheezes, rales, or rhonchi. HEART:  Regular rate rhythm, no murmurs, no rubs, no clicks Abd:  soft, positive bowel sounds, no organomegally, no rebound, no guarding Ext:  2 plus pulses, no edema, no cyanosis, no clubbing Skin:  No rashes no nodules Neuro:  CN II through XII intact, motor grossly intact  DEVICE  Normal device function.  See PaceArt for details.   Assess/Plan:

## 2013-08-13 ENCOUNTER — Encounter: Payer: Self-pay | Admitting: Internal Medicine

## 2013-09-05 DIAGNOSIS — J189 Pneumonia, unspecified organism: Secondary | ICD-10-CM

## 2013-09-05 HISTORY — DX: Pneumonia, unspecified organism: J18.9

## 2013-11-18 ENCOUNTER — Telehealth: Payer: Self-pay | Admitting: Internal Medicine

## 2013-11-18 NOTE — Telephone Encounter (Signed)
Patient is concerned about his blood pressure reading high the last few days. His readings for the past few days have been reading 150's over 90's. Pt has no symptoms with the high BP. At [place] in dec the BP read 141/82 with pulse of 84. Pt's worried because the pacemaker is set to 70-74. I told pt to monitor BP this week, and also let pt know that Dr Lovena Le will be in [place] office on wed and [place] office on Friday. Will call pt back when advised by Dr Lovena Le.

## 2014-01-06 ENCOUNTER — Other Ambulatory Visit: Payer: Self-pay | Admitting: Internal Medicine

## 2014-02-14 ENCOUNTER — Ambulatory Visit (INDEPENDENT_AMBULATORY_CARE_PROVIDER_SITE_OTHER): Payer: BC Managed Care – PPO | Admitting: *Deleted

## 2014-02-14 ENCOUNTER — Encounter: Payer: Self-pay | Admitting: Internal Medicine

## 2014-02-14 DIAGNOSIS — I498 Other specified cardiac arrhythmias: Secondary | ICD-10-CM

## 2014-02-14 DIAGNOSIS — I4891 Unspecified atrial fibrillation: Secondary | ICD-10-CM

## 2014-02-14 LAB — MDC_IDC_ENUM_SESS_TYPE_INCLINIC
Battery Remaining Longevity: 79 mo
Brady Statistic AP VP Percent: 2 %
Brady Statistic AS VP Percent: 0 %
Brady Statistic AS VS Percent: 6 %
Lead Channel Impedance Value: 515 Ohm
Lead Channel Pacing Threshold Amplitude: 1 V
Lead Channel Pacing Threshold Amplitude: 2 V
Lead Channel Sensing Intrinsic Amplitude: 4 mV
Lead Channel Sensing Intrinsic Amplitude: 4 mV
Lead Channel Setting Pacing Amplitude: 2 V
Lead Channel Setting Pacing Pulse Width: 0.52 ms
MDC IDC MSMT BATTERY IMPEDANCE: 572 Ohm
MDC IDC MSMT BATTERY VOLTAGE: 2.8 V
MDC IDC MSMT LEADCHNL RA IMPEDANCE VALUE: 608 Ohm
MDC IDC MSMT LEADCHNL RA PACING THRESHOLD PULSEWIDTH: 0.4 ms
MDC IDC MSMT LEADCHNL RV PACING THRESHOLD PULSEWIDTH: 0.76 ms
MDC IDC SESS DTM: 20150612142853
MDC IDC SET LEADCHNL RV PACING AMPLITUDE: 4 V
MDC IDC SET LEADCHNL RV SENSING SENSITIVITY: 2.8 mV
MDC IDC STAT BRADY AP VS PERCENT: 92 %

## 2014-02-14 NOTE — Progress Notes (Signed)
PPM check in office. 

## 2014-03-19 ENCOUNTER — Ambulatory Visit (HOSPITAL_COMMUNITY)
Admission: RE | Admit: 2014-03-19 | Discharge: 2014-03-19 | Disposition: A | Discharge status: Home / Self Care | Payer: BC Managed Care – PPO | Source: Ambulatory Visit | Attending: Internal Medicine | Admitting: Internal Medicine

## 2014-03-19 ENCOUNTER — Other Ambulatory Visit (HOSPITAL_COMMUNITY): Payer: Self-pay | Admitting: Internal Medicine

## 2014-03-19 DIAGNOSIS — R059 Cough, unspecified: Secondary | ICD-10-CM | POA: Insufficient documentation

## 2014-03-19 DIAGNOSIS — Z85118 Personal history of other malignant neoplasm of bronchus and lung: Secondary | ICD-10-CM | POA: Insufficient documentation

## 2014-03-19 DIAGNOSIS — J189 Pneumonia, unspecified organism: Secondary | ICD-10-CM

## 2014-03-19 DIAGNOSIS — R05 Cough: Secondary | ICD-10-CM | POA: Diagnosis not present

## 2014-09-08 ENCOUNTER — Encounter: Payer: Self-pay | Admitting: Internal Medicine

## 2014-09-08 ENCOUNTER — Ambulatory Visit (INDEPENDENT_AMBULATORY_CARE_PROVIDER_SITE_OTHER): Payer: BLUE CROSS/BLUE SHIELD | Admitting: Internal Medicine

## 2014-09-08 VITALS — BP 128/82 | HR 68 | Ht 73.0 in | Wt 199.0 lb

## 2014-09-08 DIAGNOSIS — I495 Sick sinus syndrome: Secondary | ICD-10-CM

## 2014-09-08 DIAGNOSIS — Z950 Presence of cardiac pacemaker: Secondary | ICD-10-CM

## 2014-09-08 DIAGNOSIS — I480 Paroxysmal atrial fibrillation: Secondary | ICD-10-CM

## 2014-09-08 LAB — MDC_IDC_ENUM_SESS_TYPE_INCLINIC
Battery Impedance: 701 Ohm
Battery Remaining Longevity: 73 mo
Battery Voltage: 2.8 V
Brady Statistic AP VP Percent: 2 %
Brady Statistic AP VS Percent: 93 %
Brady Statistic AS VP Percent: 0 %
Brady Statistic AS VS Percent: 5 %
Date Time Interrogation Session: 20160104091348
Lead Channel Impedance Value: 545 Ohm
Lead Channel Impedance Value: 652 Ohm
Lead Channel Pacing Threshold Amplitude: 0.75 V
Lead Channel Pacing Threshold Amplitude: 1.5 V
Lead Channel Pacing Threshold Pulse Width: 0.4 ms
Lead Channel Pacing Threshold Pulse Width: 0.64 ms
Lead Channel Sensing Intrinsic Amplitude: 2.8 mV
Lead Channel Sensing Intrinsic Amplitude: 8 mV
Lead Channel Setting Pacing Amplitude: 2 V
Lead Channel Setting Pacing Amplitude: 2.5 V
Lead Channel Setting Pacing Pulse Width: 0.64 ms
Lead Channel Setting Sensing Sensitivity: 2.8 mV

## 2014-09-08 NOTE — Assessment & Plan Note (Signed)
He is maintaining NSR 99% of the time. No change in his meds.

## 2014-09-08 NOTE — Patient Instructions (Signed)
Your physician wants you to follow-up in: 1 year with Dr. Knox Saliva will receive a reminder letter in the mail two months in advance. If you don't receive a letter, please call our office to schedule the follow-up appointment.  Remote monitoring is used to monitor your Pacemaker of ICD from home. This monitoring reduces the number of office visits required to check your device to one time per year. It allows [place] to keep an eye on the functioning of your device to ensure it is working properly. You are scheduled for a device check from home on April 4th. You may send your transmission at any time that day. If you have a wireless device, the transmission will be sent automatically. After your physician reviews your transmission, you will receive a postcard with your next transmission date.   Your physician recommends that you continue on your current medications as directed. Please refer to the Current Medication list given to you today.  Thank you for choosing [facility]!!

## 2014-09-08 NOTE — Progress Notes (Signed)
HPI [name] returns today for followup. He is a very pleasant 81 year old man with symptomatic bradycardia, status post permanent pacemaker insertion. He has sinus node dysfunction. He also is paroxysmal atrial fibrillation, which has been very well controlled. He denies chest pain or shortness of breath. No weight loss. He describes an episode of pneumonia while on a cruise of [place]. He recovered without event. No peripheral edema, cough, or syncope. No Known Allergies   Current Outpatient Prescriptions  Medication Sig Dispense Refill  . celecoxib (CELEBREX) 200 MG capsule Take 200 mg by mouth as needed.      . desoximetasone (TOPICORT) 0.25 % cream     . levothyroxine (SYNTHROID, LEVOTHROID) 100 MCG tablet Take 100 mcg by mouth daily.      . Multiple Vitamin (MULTIVITAMIN) tablet Take 1 tablet by mouth daily.      . vitamin C (ASCORBIC ACID) 500 MG tablet Take 500 mg by mouth daily.    Alveda Reasons 20 MG TABS tablet TAKE 1 TABLET BY MOUTH DAILY WITH SUPPER 90 tablet 3   No current facility-administered medications for this visit.     Past Medical History  Diagnosis Date  . 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s/p XRT and surgical removal 1990  . Dyslipidemia   . Symptomatic bradycardia     s/p PPM 1995    ROS:   All systems reviewed and negative except as noted in the HPI.   Past Surgical History  Procedure Laterality Date  . Lung removal, partial    . Pacemaker insertion  1995  . Cataract extraction, bilateral       Family History  Problem Relation Age of Onset  . Arrhythmia Father     Atrial fibrillation  . Diabetes Brother     Insulin dependent  . Breast cancer Sister   . Breast cancer Sister      History   Social History  . Marital Status: Married    Spouse Name: N/A    Number of Children: N/A  . Years of Education: N/A   Occupational History  . Retired    Social History Main Topics  . Smoking status: Former Smoker    Quit date: 09/05/1988  . Smokeless tobacco:  Not on file  . Alcohol Use: 3.5 oz/week    7 drink(s) per week     Comment: 1 drink daily  . Drug Use: Not on file  . Sexual Activity: Not on file   Other Topics Concern  . Not on file   Social History Narrative   Married     BP 128/82 mmHg  Pulse 68  Ht 6\' 1"  (1.854 m)  Wt 199 lb (90.266 kg)  BMI 26.26 kg/m2  SpO2 99%  Physical Exam:  Well appearing 81 year old man, NAD HEENT: Unremarkable Neck:  No JVD, no thyromegally Lungs:  Clear with no wheezes, rales, or rhonchi. Reduced breath sounds on the right, chronically. HEART:  Regular rate rhythm, no murmurs, no rubs, no clicks Abd:  soft, positive bowel sounds, no organomegally, no rebound, no guarding Ext:  2 plus pulses, no edema, no cyanosis, no clubbing Skin:  No rashes no nodules Neuro:  CN II through XII intact, motor grossly intact  ECG - nsr with atrial pacing.  DEVICE  Normal device function.  See PaceArt for details.   Assess/Plan:

## 2014-09-08 NOTE — Assessment & Plan Note (Signed)
He is encouraged to maintain a low fat diet. No change in meds. He remains active.

## 2014-09-08 NOTE — Assessment & Plan Note (Signed)
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 Will follow.

## 2014-09-09 ENCOUNTER — Encounter: Payer: Self-pay | Admitting: Internal Medicine

## 2014-09-09 LAB — PACEMAKER DEVICE OBSERVATION

## 2014-09-10 ENCOUNTER — Other Ambulatory Visit: Payer: Self-pay | Admitting: *Deleted

## 2014-09-10 MED ORDER — RIVAROXABAN 20 MG PO TABS: ORAL_TABLET | ORAL | Status: DC

## 2014-12-08 ENCOUNTER — Ambulatory Visit (INDEPENDENT_AMBULATORY_CARE_PROVIDER_SITE_OTHER): Payer: BLUE CROSS/BLUE SHIELD | Admitting: *Deleted

## 2014-12-08 DIAGNOSIS — I495 Sick sinus syndrome: Secondary | ICD-10-CM

## 2014-12-08 LAB — MDC_IDC_ENUM_SESS_TYPE_REMOTE
Battery Impedance: 779 Ohm
Battery Remaining Longevity: 64 mo
Battery Voltage: 2.79 V
Brady Statistic AP VS Percent: 94 %
Brady Statistic AS VS Percent: 4 %
Date Time Interrogation Session: 20160404102121
Lead Channel Impedance Value: 533 Ohm
Lead Channel Pacing Threshold Amplitude: 0.75 V
Lead Channel Pacing Threshold Amplitude: 2.5 V
Lead Channel Pacing Threshold Pulse Width: 0.4 ms
Lead Channel Pacing Threshold Pulse Width: 0.4 ms
Lead Channel Sensing Intrinsic Amplitude: 5.6 mV
Lead Channel Setting Pacing Pulse Width: 0.76 ms
MDC IDC MSMT LEADCHNL RA IMPEDANCE VALUE: 629 Ohm
MDC IDC SET LEADCHNL RA PACING AMPLITUDE: 2 V
MDC IDC SET LEADCHNL RV PACING AMPLITUDE: 5 V
MDC IDC SET LEADCHNL RV SENSING SENSITIVITY: 2 mV
MDC IDC STAT BRADY AP VP PERCENT: 2 %
MDC IDC STAT BRADY AS VP PERCENT: 0 %

## 2014-12-08 NOTE — Progress Notes (Signed)
Remote pacemaker transmission.   

## 2014-12-18 ENCOUNTER — Encounter: Payer: Self-pay | Admitting: Cardiology

## 2014-12-23 ENCOUNTER — Encounter: Payer: Self-pay | Admitting: Internal Medicine

## 2015-03-10 ENCOUNTER — Ambulatory Visit (INDEPENDENT_AMBULATORY_CARE_PROVIDER_SITE_OTHER): Payer: BLUE CROSS/BLUE SHIELD | Admitting: *Deleted

## 2015-03-10 ENCOUNTER — Encounter: Payer: Self-pay | Admitting: Internal Medicine

## 2015-03-10 DIAGNOSIS — I495 Sick sinus syndrome: Secondary | ICD-10-CM | POA: Diagnosis not present

## 2015-03-11 NOTE — Progress Notes (Signed)
Remote pacemaker transmission.   

## 2015-03-15 LAB — CUP PACEART REMOTE DEVICE CHECK
Battery Impedance: 961 Ohm
Battery Voltage: 2.79 V
Brady Statistic AP VP Percent: 2 %
Brady Statistic AP VS Percent: 91 %
Brady Statistic AS VP Percent: 0 %
Brady Statistic AS VS Percent: 7 %
Date Time Interrogation Session: 20160705114233
Lead Channel Impedance Value: 641 Ohm
Lead Channel Pacing Threshold Amplitude: 1 V
Lead Channel Sensing Intrinsic Amplitude: 5.6 mV
Lead Channel Setting Pacing Amplitude: 2 V
Lead Channel Setting Sensing Sensitivity: 2.8 mV
MDC IDC MSMT BATTERY REMAINING LONGEVITY: 46 mo
MDC IDC MSMT LEADCHNL RA PACING THRESHOLD PULSEWIDTH: 0.4 ms
MDC IDC MSMT LEADCHNL RV IMPEDANCE VALUE: 563 Ohm
MDC IDC SET LEADCHNL RV PACING AMPLITUDE: 5 V
MDC IDC SET LEADCHNL RV PACING PULSEWIDTH: 1 ms

## 2015-04-01 ENCOUNTER — Encounter: Payer: Self-pay | Admitting: *Deleted

## 2015-06-11 ENCOUNTER — Ambulatory Visit (INDEPENDENT_AMBULATORY_CARE_PROVIDER_SITE_OTHER): Payer: BLUE CROSS/BLUE SHIELD | Admitting: *Deleted

## 2015-06-11 ENCOUNTER — Encounter: Payer: Self-pay | Admitting: Internal Medicine

## 2015-06-11 DIAGNOSIS — I495 Sick sinus syndrome: Secondary | ICD-10-CM

## 2015-06-11 NOTE — Progress Notes (Signed)
Remote pacemaker transmission.   

## 2015-06-12 LAB — CUP PACEART REMOTE DEVICE CHECK
Battery Remaining Longevity: 29 mo
Battery Voltage: 2.74 V
Brady Statistic AS VP Percent: 1 %
Lead Channel Impedance Value: 525 Ohm
Lead Channel Pacing Threshold Amplitude: 0.875 V
Lead Channel Sensing Intrinsic Amplitude: 5.6 mV
Lead Channel Setting Pacing Amplitude: 2 V
MDC IDC MSMT BATTERY IMPEDANCE: 1235 Ohm
MDC IDC MSMT LEADCHNL RA IMPEDANCE VALUE: 629 Ohm
MDC IDC MSMT LEADCHNL RA PACING THRESHOLD PULSEWIDTH: 0.4 ms
MDC IDC SESS DTM: 20161006121339
MDC IDC SET LEADCHNL RV PACING AMPLITUDE: 5 V
MDC IDC SET LEADCHNL RV PACING PULSEWIDTH: 1 ms
MDC IDC SET LEADCHNL RV SENSING SENSITIVITY: 2 mV
MDC IDC STAT BRADY AP VP PERCENT: 3 %
MDC IDC STAT BRADY AP VS PERCENT: 89 %
MDC IDC STAT BRADY AS VS PERCENT: 7 %

## 2015-10-02 ENCOUNTER — Encounter: Payer: Self-pay | Admitting: Internal Medicine

## 2015-10-02 ENCOUNTER — Ambulatory Visit (INDEPENDENT_AMBULATORY_CARE_PROVIDER_SITE_OTHER): Payer: BLUE CROSS/BLUE SHIELD | Admitting: Internal Medicine

## 2015-10-02 VITALS — BP 124/74 | HR 99 | Ht 72.0 in | Wt 205.0 lb

## 2015-10-02 DIAGNOSIS — R609 Edema, unspecified: Secondary | ICD-10-CM | POA: Diagnosis not present

## 2015-10-02 DIAGNOSIS — R600 Localized edema: Secondary | ICD-10-CM | POA: Insufficient documentation

## 2015-10-02 DIAGNOSIS — I4891 Unspecified atrial fibrillation: Secondary | ICD-10-CM | POA: Diagnosis not present

## 2015-10-02 DIAGNOSIS — Z950 Presence of cardiac pacemaker: Secondary | ICD-10-CM

## 2015-10-02 LAB — CUP PACEART INCLINIC DEVICE CHECK
Battery Remaining Longevity: 36 mo
Battery Voltage: 2.72 V
Brady Statistic AP VP Percent: 2.7 %
Brady Statistic AP VS Percent: 88.2 %
Brady Statistic AS VS Percent: 7.9 %
Implantable Lead Implant Date: 19950526
Implantable Lead Serial Number: 55516
Lead Channel Impedance Value: 507 Ohm
Lead Channel Impedance Value: 606 Ohm
Lead Channel Pacing Threshold Amplitude: 1.25 V
Lead Channel Sensing Intrinsic Amplitude: 5.6 mV
Lead Channel Setting Pacing Amplitude: 2 V
Lead Channel Setting Sensing Sensitivity: 2 mV
MDC IDC LEAD IMPLANT DT: 19950526
MDC IDC LEAD LOCATION: 753859
MDC IDC LEAD LOCATION: 753860
MDC IDC LEAD SERIAL: 55140
MDC IDC MSMT BATTERY IMPEDANCE: 1543 Ohm
MDC IDC MSMT LEADCHNL RA PACING THRESHOLD AMPLITUDE: 0.875 V
MDC IDC MSMT LEADCHNL RA PACING THRESHOLD PULSEWIDTH: 0.4 ms
MDC IDC MSMT LEADCHNL RV PACING THRESHOLD PULSEWIDTH: 1 ms
MDC IDC SESS DTM: 20170127150815
MDC IDC SET LEADCHNL RV PACING AMPLITUDE: 5 V
MDC IDC SET LEADCHNL RV PACING PULSEWIDTH: 1 ms
MDC IDC STAT BRADY AS VP PERCENT: 1.2 %

## 2015-10-02 NOTE — Assessment & Plan Note (Signed)
I suspect that this is multifactorial in etiology. I have asked that he reduce his sodium intake. No need for diuretic therapy yet.

## 2015-10-02 NOTE — Assessment & Plan Note (Signed)
His atrial fib has now become chronic. He is asymptomatic. He will continue his current meds.

## 2015-10-02 NOTE — Progress Notes (Signed)
HPI [name] returns today for followup. He is a very pleasant 82 year old man with symptomatic bradycardia, status post permanent pacemaker insertion. He has sinus node dysfunction. He also is paroxysmal atrial fibrillation, which has been very well controlled. He denies chest pain or shortness of breath. No weight loss. He recovered without event. No peripheral edema, cough, or syncope. He has had some peripheral edema. No Known Allergies   Current Outpatient Prescriptions  Medication Sig Dispense Refill  . celecoxib (CELEBREX) 200 MG capsule Take 200 mg by mouth as needed.      . desoximetasone (TOPICORT) 0.25 % cream     . levothyroxine (SYNTHROID, LEVOTHROID) 100 MCG tablet Take 100 mcg by mouth daily.      . Multiple Vitamin (MULTIVITAMIN) tablet Take 1 tablet by mouth daily.      . rivaroxaban (XARELTO) 20 MG TABS tablet TAKE 1 TABLET BY MOUTH DAILY WITH SUPPER 90 tablet 3  . vitamin C (ASCORBIC ACID) 500 MG tablet Take 500 mg by mouth daily.     No current facility-administered medications for this visit.     Past Medical History  Diagnosis Date  . 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[facility])     s/p XRT and surgical removal 1990  . Dyslipidemia   . Symptomatic bradycardia     s/p PPM 1995    ROS:   All systems reviewed and negative except as noted in the HPI.   Past Surgical History  Procedure Laterality Date  . Lung removal, partial    . Pacemaker insertion  1995  . Cataract extraction, bilateral       Family History  Problem Relation Age of Onset  . Arrhythmia Father     Atrial fibrillation  . Diabetes Brother     Insulin dependent  . Breast cancer Sister   . Breast cancer Sister      Social History   Social History  . Marital Status: Married    Spouse Name: N/A  . Number of Children: N/A  . Years of Education: N/A   Occupational History  . Retired    Social History Main Topics  . Smoking status: Former Smoker    Quit date: 09/05/1988  . Smokeless tobacco: Not on  file  . Alcohol Use: 4.2 oz/week    7 Standard drinks or equivalent per week     Comment: 1 drink daily  . Drug Use: Not on file  . Sexual Activity: Not on file   Other Topics Concern  . Not on file   Social History Narrative   Married     BP 124/74 mmHg  Pulse 99  Ht 6' (1.829 m)  Wt 205 lb (92.987 kg)  BMI 27.80 kg/m2  SpO2 93%  Physical Exam:  Well appearing 82 year old man, NAD HEENT: Unremarkable Neck:  6 cm JVD, no thyromegally Lungs:  Clear with no wheezes, rales, or rhonchi. Reduced breath sounds on the right, chronically. HEART:  Regular rate rhythm, no murmurs, no rubs, no clicks Abd:  soft, positive bowel sounds, no organomegally, no rebound, no guarding Ext:  2 plus pulses, 1+ edema, no cyanosis, no clubbing Skin:  No rashes no nodules Neuro:  CN II through XII intact, motor grossly intact  ECG - atrial fib with ventricular pacing  DEVICE  Normal device function.  See PaceArt for details.   Assess/Plan:

## 2015-10-02 NOTE — Patient Instructions (Signed)
Your physician wants you to follow-up in: 12 Months with Dr. Lovena Le. You will receive a reminder letter in the mail two months in advance. If you don't receive a letter, please call our office to schedule the follow-up appointment.  Remote monitoring is used to monitor your Pacemaker of ICD from home. This monitoring reduces the number of office visits required to check your device to one time per year. It allows [place] to keep an eye on the functioning of your device to ensure it is working properly. You are scheduled for a device check from home on 01/04/16. You may send your transmission at any time that day. If you have a wireless device, the transmission will be sent automatically. After your physician reviews your transmission, you will receive a postcard with your next transmission date.  Your physician recommends that you continue on your current medications as directed. Please refer to the Current Medication list given to you today.  If you need a refill on your cardiac medications before your next appointment, please call your pharmacy.  Thank you for choosing [facility]!

## 2015-10-02 NOTE — Assessment & Plan Note (Signed)
He is encouraged to maintain a low fat diet.

## 2015-10-02 NOTE — Assessment & Plan Note (Signed)
His medtronic PPM has been reprogrammed to the vvir mode.

## 2015-11-12 ENCOUNTER — Other Ambulatory Visit: Payer: Self-pay | Admitting: Internal Medicine

## 2015-11-12 MED ORDER — RIVAROXABAN 20 MG PO TABS: ORAL_TABLET | ORAL | Status: DC

## 2015-11-12 NOTE — Telephone Encounter (Signed)
Needs refill called to above pharmacy on Xarelto / tg

## 2015-11-12 NOTE — Telephone Encounter (Signed)
Refill complete 

## 2016-01-04 ENCOUNTER — Ambulatory Visit (INDEPENDENT_AMBULATORY_CARE_PROVIDER_SITE_OTHER): Payer: BLUE CROSS/BLUE SHIELD | Admitting: *Deleted

## 2016-01-04 DIAGNOSIS — I495 Sick sinus syndrome: Secondary | ICD-10-CM | POA: Diagnosis not present

## 2016-01-04 NOTE — Progress Notes (Signed)
Remote pacemaker transmission.   

## 2016-02-08 ENCOUNTER — Encounter: Payer: Self-pay | Admitting: Gastroenterology

## 2016-02-12 ENCOUNTER — Encounter: Payer: Self-pay | Admitting: Cardiology

## 2016-02-12 LAB — CUP PACEART REMOTE DEVICE CHECK
Battery Remaining Longevity: 25 mo
Battery Voltage: 2.76 V
Implantable Lead Implant Date: 19950526
Implantable Lead Implant Date: 19950526
Implantable Lead Location: 753860
Lead Channel Pacing Threshold Amplitude: 2.25 V
Lead Channel Pacing Threshold Pulse Width: 0.4 ms
MDC IDC LEAD LOCATION: 753859
MDC IDC LEAD SERIAL: 55140
MDC IDC LEAD SERIAL: 55516
MDC IDC MSMT BATTERY IMPEDANCE: 1796 Ohm
MDC IDC MSMT LEADCHNL RA IMPEDANCE VALUE: 67 Ohm
MDC IDC MSMT LEADCHNL RV IMPEDANCE VALUE: 503 Ohm
MDC IDC SESS DTM: 20170501104853
MDC IDC SET LEADCHNL RV PACING AMPLITUDE: 4.5 V
MDC IDC SET LEADCHNL RV PACING PULSEWIDTH: 0.4 ms
MDC IDC SET LEADCHNL RV SENSING SENSITIVITY: 2 mV
MDC IDC STAT BRADY RV PERCENT PACED: 95 %

## 2016-02-19 ENCOUNTER — Encounter: Payer: Self-pay | Admitting: Gastroenterology

## 2016-02-19 ENCOUNTER — Ambulatory Visit (INDEPENDENT_AMBULATORY_CARE_PROVIDER_SITE_OTHER): Payer: BLUE CROSS/BLUE SHIELD | Admitting: Gastroenterology

## 2016-02-19 VITALS — BP 124/78 | HR 69 | Ht 72.0 in | Wt 201.6 lb

## 2016-02-19 DIAGNOSIS — K921 Melena: Secondary | ICD-10-CM

## 2016-02-19 DIAGNOSIS — Z7901 Long term (current) use of anticoagulants: Secondary | ICD-10-CM

## 2016-02-19 NOTE — Progress Notes (Signed)
02/19/2016 Robert [name] [id number] 1929-05-29   HISTORY OF PRESENT ILLNESS:  This is an 82 year old male who is new to our practice. He has a past medical history of a 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for which he had radiation and surgical resection in 1990, hypothyroidism, symptomatic bradycardia for which he had a pacemaker placed in 1995, and paroxysmal atrial fibrillation for which he is on Xarelto. His wife is a retired Marine scientist. He presents to our office today with his wife for evaluation of recent black stools.  He reports 2-3 weeks ago he had 3 episodes of very black stools. Since that time they have returned to normal and he's had no recurrence of this. He did not see anybody so we do not know for sure if they were heme-positive. He denies use of any Pepto-Bismol or iron supplements. He admits to occasional use of Celebrex for aches and pains but says that he only takes that maybe a couple of times per month. Also uses Alka-Seltzer on very rare occasion as well. He denies any other associated symptoms. He denies any dizziness, weakness, fatigue, shortness of breath.  Had a colonoscopy more than 5 years ago in [place], which they report he had a couple polyps but they told him he did not need a recall for 10 years.  Does not believe he's ever had an endoscopy.   Past Medical History  Diagnosis Date  . 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[facility])     s/p XRT and surgical removal 1990  . Hypothyroidism   . Symptomatic bradycardia     s/p PPM 1995  . Pneumonia 2015  . Atrial fibrillation [facility])    Past Surgical History  Procedure Laterality Date  . Lung removal, partial    . Pacemaker insertion  1995  . Cataract extraction, bilateral      reports that he quit smoking about 27 years ago. He does not have any smokeless tobacco history on file. He reports that he drinks about 4.2 oz of alcohol per week. He reports that he does not use illicit drugs. family history includes Arrhythmia in his father; Breast cancer in his sister  and sister; Diabetes in his brother. No Known Allergies    Outpatient Encounter Prescriptions as of 02/19/2016  Medication Sig  . celecoxib (CELEBREX) 200 MG capsule Take 200 mg by mouth as needed.    . desoximetasone (TOPICORT) 0.25 % cream   . levothyroxine (SYNTHROID, LEVOTHROID) 100 MCG tablet Take 100 mcg by mouth daily.    . Multiple Vitamin (MULTIVITAMIN) tablet Take 1 tablet by mouth daily.    . rivaroxaban (XARELTO) 20 MG TABS tablet TAKE 1 TABLET BY MOUTH DAILY WITH SUPPER  . vitamin C (ASCORBIC ACID) 500 MG tablet Take 500 mg by mouth daily.   No facility-administered encounter medications on file as of 02/19/2016.     REVIEW OF SYSTEMS  : All other systems reviewed and negative except where noted in the History of Present Illness.   PHYSICAL EXAM: BP 124/78 mmHg  Pulse 69  Ht 6' (1.829 m)  Wt 201 lb 9.6 oz (91.445 kg)  BMI 27.34 kg/m2 General: Well developed white male in no acute distress Head: Normocephalic and atraumatic Eyes:  Sclerae anicteric, conjunctiva pink. Ears: Normal auditory acuity Lungs: Clear throughout to auscultation Heart: Regular rate and rhythm Abdomen: Soft, non-distended.  Normal bowel sounds.  Non-tender. Rectal:  No external abnormalities noted.  DRE revealed no masses.  Had a small amount of light brown, heme negative stool on  exam glove. Musculoskeletal: Symmetrical with no gross deformities  Skin: No lesions on visible extremities Extremities: No edema  Neurological: Alert oriented x 4, grossly non-focal Psychological:  Alert and cooperative. Normal mood and affect  ASSESSMENT AND PLAN: -82 year old male on Xarelto for atrial fibrillation who had 3 episodes of black stools a couple of weeks ago:  No recurrence since that time. No other associated symptoms. He is heme negative on exam today. He is having labs performed on Monday by his PCP so I have asked that they have those results faxed to [place] so we can have record of a recent  hemoglobin. His wife is a retired Marine scientist and I discussed with them the possibilities in differential diagnosis for this including ulcer versus AVM versus gastritis/esophagitis, versus less likely malignancy. They are in agreement to just monitor this for now and will call back with any other symptoms.   CC:  Celene Squibb, MD

## 2016-02-19 NOTE — Patient Instructions (Signed)
Your family doctor can fax information to:  [phone].  Please call back with any other symptoms

## 2016-02-19 NOTE — Progress Notes (Signed)
Reviewed and agree with documentation and assessment and plan. K. Veena Kesia Dalto , MD   

## 2016-04-04 ENCOUNTER — Ambulatory Visit (INDEPENDENT_AMBULATORY_CARE_PROVIDER_SITE_OTHER): Payer: BLUE CROSS/BLUE SHIELD | Admitting: *Deleted

## 2016-04-04 DIAGNOSIS — I495 Sick sinus syndrome: Secondary | ICD-10-CM | POA: Diagnosis not present

## 2016-04-04 NOTE — Progress Notes (Signed)
Remote pacemaker transmission.   

## 2016-04-05 ENCOUNTER — Encounter: Payer: Self-pay | Admitting: Cardiology

## 2016-04-07 LAB — CUP PACEART REMOTE DEVICE CHECK
Battery Remaining Longevity: 23 mo
Brady Statistic RV Percent Paced: 95 %
Date Time Interrogation Session: 20170731120023
Implantable Lead Implant Date: 19950526
Implantable Lead Location: 753859
Implantable Lead Serial Number: 55140
Implantable Lead Serial Number: 55516
Lead Channel Impedance Value: 518 Ohm
Lead Channel Impedance Value: 67 Ohm
Lead Channel Pacing Threshold Amplitude: 2.5 V
Lead Channel Setting Pacing Amplitude: 5 V
Lead Channel Setting Pacing Pulse Width: 0.4 ms
MDC IDC LEAD IMPLANT DT: 19950526
MDC IDC LEAD LOCATION: 753860
MDC IDC MSMT BATTERY IMPEDANCE: 1859 Ohm
MDC IDC MSMT BATTERY VOLTAGE: 2.74 V
MDC IDC MSMT LEADCHNL RV PACING THRESHOLD PULSEWIDTH: 0.4 ms
MDC IDC SET LEADCHNL RV SENSING SENSITIVITY: 2.8 mV

## 2016-07-04 ENCOUNTER — Ambulatory Visit (INDEPENDENT_AMBULATORY_CARE_PROVIDER_SITE_OTHER): Payer: BLUE CROSS/BLUE SHIELD | Admitting: *Deleted

## 2016-07-04 DIAGNOSIS — I495 Sick sinus syndrome: Secondary | ICD-10-CM | POA: Diagnosis not present

## 2016-07-05 NOTE — Progress Notes (Signed)
Remote pacemaker transmission.   

## 2016-07-08 ENCOUNTER — Encounter: Payer: Self-pay | Admitting: Cardiology

## 2016-07-14 LAB — CUP PACEART REMOTE DEVICE CHECK
Battery Voltage: 2.75 V
Date Time Interrogation Session: 20171030120234
Implantable Lead Implant Date: 19950526
Implantable Lead Location: 753860
Implantable Lead Serial Number: 55140
Implantable Lead Serial Number: 55516
Implantable Pulse Generator Implant Date: 20100715
Lead Channel Pacing Threshold Pulse Width: 0.4 ms
Lead Channel Setting Pacing Amplitude: 4.5 V
Lead Channel Setting Sensing Sensitivity: 2.8 mV
MDC IDC LEAD IMPLANT DT: 19950526
MDC IDC LEAD LOCATION: 753859
MDC IDC MSMT BATTERY IMPEDANCE: 2088 Ohm
MDC IDC MSMT BATTERY REMAINING LONGEVITY: 21 mo
MDC IDC MSMT LEADCHNL RA IMPEDANCE VALUE: 67 Ohm
MDC IDC MSMT LEADCHNL RV IMPEDANCE VALUE: 500 Ohm
MDC IDC MSMT LEADCHNL RV PACING THRESHOLD AMPLITUDE: 2.25 V
MDC IDC SET LEADCHNL RV PACING PULSEWIDTH: 0.4 ms
MDC IDC STAT BRADY RV PERCENT PACED: 95 %

## 2016-08-10 ENCOUNTER — Ambulatory Visit (INDEPENDENT_AMBULATORY_CARE_PROVIDER_SITE_OTHER): Payer: BLUE CROSS/BLUE SHIELD | Admitting: Internal Medicine

## 2016-08-10 ENCOUNTER — Encounter: Payer: Self-pay | Admitting: Internal Medicine

## 2016-08-10 VITALS — BP 148/91 | HR 77 | Ht 72.0 in | Wt 203.0 lb

## 2016-08-10 DIAGNOSIS — Z950 Presence of cardiac pacemaker: Secondary | ICD-10-CM

## 2016-08-10 DIAGNOSIS — I4891 Unspecified atrial fibrillation: Secondary | ICD-10-CM | POA: Diagnosis not present

## 2016-08-10 LAB — CUP PACEART INCLINIC DEVICE CHECK
Battery Impedance: 2209 Ohm
Brady Statistic RV Percent Paced: 95 %
Implantable Lead Implant Date: 19950526
Implantable Lead Location: 753859
Lead Channel Impedance Value: 515 Ohm
Lead Channel Impedance Value: 67 Ohm
Lead Channel Sensing Intrinsic Amplitude: 8 mV
Lead Channel Setting Pacing Amplitude: 4.5 V
Lead Channel Setting Pacing Pulse Width: 0.4 ms
MDC IDC LEAD IMPLANT DT: 19950526
MDC IDC LEAD LOCATION: 753860
MDC IDC LEAD SERIAL: 55140
MDC IDC LEAD SERIAL: 55516
MDC IDC MSMT BATTERY REMAINING LONGEVITY: 20 mo
MDC IDC MSMT BATTERY VOLTAGE: 2.74 V
MDC IDC MSMT LEADCHNL RV PACING THRESHOLD AMPLITUDE: 2.25 V
MDC IDC MSMT LEADCHNL RV PACING THRESHOLD PULSEWIDTH: 0.4 ms
MDC IDC PG IMPLANT DT: 20100715
MDC IDC SESS DTM: 20171206172020
MDC IDC SET LEADCHNL RV SENSING SENSITIVITY: 2.8 mV

## 2016-08-10 NOTE — Progress Notes (Signed)
HPI [name] returns today for followup. He is a very pleasant 82 year old man with symptomatic bradycardia, status post permanent pacemaker insertion. He has sinus node dysfunction. He also now has chronic atrial fibrillation, which has been very well controlled. He denies chest pain or shortness of breath. No weight loss. No peripheral edema, cough, or syncope. He has had some peripheral edema. His only complaint today involves bleeding of his gums.  No Known Allergies   Current Outpatient Prescriptions  Medication Sig Dispense Refill  . acetaminophen-codeine (TYLENOL #3) 300-30 MG tablet Take 1 tablet by mouth daily as needed.    . desoximetasone (TOPICORT) 0.25 % cream as directed.     Marland Kitchen levothyroxine (SYNTHROID, LEVOTHROID) 100 MCG tablet Take 100 mcg by mouth daily.      . Multiple Vitamin (MULTIVITAMIN) tablet Take 1 tablet by mouth daily.      . rivaroxaban (XARELTO) 20 MG TABS tablet TAKE 1 TABLET BY MOUTH DAILY WITH SUPPER 90 tablet 3  . vitamin C (ASCORBIC ACID) 500 MG tablet Take 500 mg by mouth daily.     No current facility-administered medications for this visit.      Past Medical History:  Diagnosis Date  . Atrial fibrillation ([place])   . Hypothyroidism   . 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[facility])    s/p XRT and surgical removal 1990  . Pneumonia 2015  . Symptomatic bradycardia    s/p PPM 1995    ROS:   All systems reviewed and negative except as noted in the HPI.   Past Surgical History:  Procedure Laterality Date  . CATARACT EXTRACTION, BILATERAL    . LUNG REMOVAL, PARTIAL    . PACEMAKER INSERTION  1995     Family History  Problem Relation Age of Onset  . Arrhythmia Father     Atrial fibrillation  . Diabetes Brother     Insulin dependent  . Breast cancer Sister   . Breast cancer Sister      Social History   Social History  . Marital status: Married    Spouse name: N/A  . Number of children: 2  . Years of education: N/A   Occupational History  . Retired     Social History Main Topics  . Smoking status: Former Smoker    Quit date: 09/05/1988  . Smokeless tobacco: Never Used  . Alcohol use 4.2 oz/week    7 Standard drinks or equivalent per week     Comment: 1 drink daily  . Drug use: No  . Sexual activity: Not on file   Other Topics Concern  . Not on file   Social History Narrative   Married     BP (!) 148/91   Pulse 77   Ht 6' (1.829 m)   Wt 203 lb (92.1 kg)   BMI 27.53 kg/m   Physical Exam:  Well appearing 82 year old man, NAD HEENT: Unremarkable Neck:  6 cm JVD, no thyromegally Lungs:  Clear with no wheezes, rales, or rhonchi. Reduced breath sounds on the right, chronically. HEART:  Regular rate rhythm, no murmurs, no rubs, no clicks Abd:  soft, positive bowel sounds, no organomegally, no rebound, no guarding Ext:  2 plus pulses, 1+ edema, no cyanosis, no clubbing Skin:  No rashes no nodules Neuro:  CN II through XII intact, motor grossly intact   DEVICE  Normal device function.  See PaceArt for details.   Assess/Plan:  1. Atrial fib - his ventricular rate is well controlled. He will continue his current meds.  2. Oral bleeding - he has seen his dentist. He has had spontaneous bleeding in his gums. He is on xarelto. He is scheduled to have his labs check. He might need a reduction of his Xarelto if the creatinine is increased. 3. PPM - his PPM is less than 2 years from ERI. I have recommended watchful waiting. He will likely need a new RV lead as his pacing threshold has increased over time. 4. Lung Ca - he has been in remission for years. He will continue watchful waiting  Mikle Bosworth.D.

## 2016-08-10 NOTE — Patient Instructions (Signed)
Medication Instructions:  Your physician recommends that you continue on your current medications as directed. Please refer to the Current Medication list given to you today.   Labwork: None Ordered   Testing/Procedures: None Ordered   Follow-Up: Your physician wants you to follow-up in: 1 year with Dr. Lovena Le.  You will receive a reminder letter in the mail two months in advance. If you don't receive a letter, please call our office to schedule the follow-up appointment.  Remote monitoring is used to monitor your Pacemaker from home. This monitoring reduces the number of office visits required to check your device to one time per year. It allows [place] to keep an eye on the functioning of your device to ensure it is working properly. You are scheduled for a device check from home on 11/09/16. You may send your transmission at any time that day. If you have a wireless device, the transmission will be sent automatically. After your physician reviews your transmission, you will receive a postcard with your next transmission date.    Any Other Special Instructions Will Be Listed Below (If Applicable).     If you need a refill on your cardiac medications before your next appointment, please call your pharmacy.

## 2016-08-24 ENCOUNTER — Ambulatory Visit: Payer: Self-pay | Admitting: Surgery

## 2016-08-24 NOTE — H&P (Signed)
History of Present Illness Robert [name]. Robert Lazaro MD; 08/24/2016 10:17 AM) The patient is a 82 year old male who presents with an inguinal hernia. PCP - [name] - [name]  This is a 82 year old male referred by Dr. [name] for evaluation of a left inguinal hernia. I recently repaired his wife's left inguinal hernia and she has done quite well. Patient states that he has noticed this hernia in his left groin for about 4 months. He remains reducible. It has enlarged slightly. He denies any obstructive symptoms and continues to have regular bowel movements. The patient has a pacemaker in place and is managed by Dr. Lovena Le. He is chronically anticoagulated on Xarelto for chronic atrial fibrillation. His functional status remains good.   Past Surgical History Robert [name], CMA; 08/23/2016 2:41 PM) Cataract Surgery Bilateral. Lung Surgery Right.  Diagnostic Studies History Robert [name], Robert [name]; 08/23/2016 2:41 PM) Colonoscopy 5-10 years ago  Allergies Robert [name], [name]; 08/23/2016 2:41 PM) No Known Drug Allergies 08/23/2016  Medication History Robert [name], CMA; 08/23/2016 2:43 PM) Acetaminophen-Codeine #3 (300-'30MG'$  Tablet, Oral) Active. Topicort (0.25% Cream, External as needed) Active. Levothyroxine Sodium (100MCG Tablet, Oral) Active. Multivitamin Adult (Oral) Active. Xarelto ('20MG'$  Tablet, Oral) Active. Vitamin C ('500MG'$  Tablet, Oral) Active. Medications Reconciled  Social History Robert [name], CMA; 08/23/2016 2:41 PM) Alcohol use Moderate alcohol use. Caffeine use Coffee, Tea. No drug use Tobacco use Former smoker.  Family History Robert [name], [place]; 08/23/2016 2:41 PM) Breast Cancer Sister. Diabetes Mellitus Brother. Heart Disease Brother. Respiratory Condition Brother.  Other Problems Robert [name], [name]; 08/23/2016 2:41 PM) Atrial Fibrillation Back Pain Inguinal Hernia Lung Cancer Thyroid Disease     Review of Systems  Robert [name] CMA; 08/23/2016 2:41 PM) General Not Present- Appetite Loss, Chills, Fatigue, Fever, Night Sweats, Weight Gain and Weight Loss. Skin Not Present- Change in Wart/Mole, Dryness, Hives, Jaundice, New Lesions, Non-Healing Wounds, Rash and Ulcer. HEENT Not Present- Earache, Hearing Loss, Hoarseness, Nose Bleed, Oral Ulcers, Ringing in the Ears, Seasonal Allergies, Sinus Pain, Sore Throat, Visual Disturbances, Wears glasses/contact lenses and Yellow Eyes. Respiratory Not Present- Bloody sputum, Chronic Cough, Difficulty Breathing, Snoring and Wheezing. Breast Not Present- Breast Mass, Breast Pain, Nipple Discharge and Skin Changes. Cardiovascular Present- Leg Cramps and Swelling of Extremities. Not Present- Chest Pain, Difficulty Breathing Lying Down, Palpitations, Rapid Heart Rate and Shortness of Breath. Gastrointestinal Not Present- Abdominal Pain, Bloating, Bloody Stool, Change in Bowel Habits, Chronic diarrhea, Constipation, Difficulty Swallowing, Excessive gas, Gets full quickly at meals, Hemorrhoids, Indigestion, Nausea, Rectal Pain and Vomiting. Male Genitourinary Not Present- Blood in Urine, Change in Urinary Stream, Frequency, Impotence, Nocturia, Painful Urination, Urgency and Urine Leakage. Musculoskeletal Present- Back Pain. Not Present- Joint Pain, Joint Stiffness, Muscle Pain, Muscle Weakness and Swelling of Extremities. Neurological Not Present- Decreased Memory, Fainting, Headaches, Numbness, Seizures, Tingling, Tremor, Trouble walking and Weakness. Psychiatric Not Present- Anxiety, Bipolar, Change in Sleep Pattern, Depression, Fearful and Frequent crying. Endocrine Not Present- Cold Intolerance, Excessive Hunger, Hair Changes, Heat Intolerance, Hot flashes and New Diabetes. Hematology Present- Blood Thinners. Not Present- Easy Bruising, Excessive bleeding, Gland problems, HIV and Persistent Infections.  Vitals Robert [name] CMA; 08/23/2016 2:43 PM) 08/23/2016 2:43  PM Weight: 205.2 lb Height: 72in Height was reported by patient. Body Surface Area: 2.15 m Body Mass Index: 27.83 kg/m  Temp.: 97.42F  Pulse: 75 (Regular)  BP: 122/84 (Sitting, Left Arm, Standard)      Physical Exam [name] [name] MD; 08/24/2016 10:18 AM)  The physical exam findings are as  follows: Note:WDWN in NAD Eyes: Pupils equal, round; sclera anicteric HENT: Oral mucosa moist; good dentition Neck: No masses palpated, no thyromegaly Lungs: CTA bilaterally; normal respiratory effort CV: Regular rate and rhythm; no murmurs; extremities well-perfused with no edema Abd: +bowel sounds, soft, non-tender, no palpable organomegaly; no umbilical hernia GU: Bilateral descended testes, no testicular masses, visible reducible left inguinal bulge that enlarges with Valsalva maneuver; no sign of right inguinal hernia Skin: Warm, dry; no sign of jaundice Psychiatric - alert and oriented x 4; calm mood and affect    Assessment & Plan [name] [name] MD; 08/24/2016 10:18 AM)  INGUINAL HERNIA OF LEFT SIDE WITHOUT OBSTRUCTION OR GANGRENE (K40.90)  Current Plans Schedule for Surgery - Left inguinal hernia repair with mesh. The surgical procedure has been discussed with the patient. Potential risks, benefits, alternative treatments, and expected outcomes have been explained. All of the patient's questions at this time have been answered. The likelihood of reaching the patient's treatment goal is good. The patient understand the proposed surgical procedure and wishes to proceed.  Cardiac clearance by Dr. Lovena Le. He will need to hold his relative for at least 2 days prior to surgery. We will likely perform surgery at [facility] and keep him overnight on telemetry.  Patient would like to schedule this in March or April  [name] [name], MD, [facility]  General/ Trauma Surgery  08/24/2016 10:18 AM

## 2016-08-31 ENCOUNTER — Telehealth: Payer: Self-pay | Admitting: Internal Medicine

## 2016-08-31 NOTE — Telephone Encounter (Signed)
[name] from Dr. Vonna Kotyk office stated that she faxed over a request for surgical clearance for [name] on 08-23-16. She is calling to check on the status of the clearance. Please call [phone]. Thanks.

## 2016-09-01 NOTE — Telephone Encounter (Signed)
Faxed clearance to [facility] [phone]. Dr Lovena Le stated "May proceed with surgery. Low cardiac risk. Low (but not zero) stroke risk"

## 2016-09-08 ENCOUNTER — Telehealth: Payer: Self-pay

## 2016-09-08 NOTE — Telephone Encounter (Signed)
Faxed recommendation from Dr. Lovena Le r/t [place] to [facility] [phone].

## 2016-11-07 ENCOUNTER — Other Ambulatory Visit: Payer: Self-pay | Admitting: *Deleted

## 2016-11-07 NOTE — Telephone Encounter (Signed)
Pt & wife state that the pt just picked up a 90 day supply of Xarelto '20mg'$  & the med is npt needed at this time. Advised that at this time per Dr. Tanna Furry progress note on 08/10/16 the pt was due to have kidney function labs done because pt may need a dose change. The labs that that were done in June 2017 by PCP Dr. Edwyna Ready Hall-Crea-1.53, wt-92.1kg, Age-83 yrs old, CrCl-45.56m/min; per dosing criteria the pt should have been switched to Xarelto '15mg'$ . The pt did not follow up with PCP as instructed by Dr. TLovena Leto have labs done, therefore, called the pt & [facility] if [facility] & the wife who was on the phone stated they would go this week to have labs drawn. I will follow up on Thursday to ensure they went to have labs drawn.

## 2016-11-08 ENCOUNTER — Telehealth: Payer: Self-pay | Admitting: Internal Medicine

## 2016-11-08 DIAGNOSIS — E785 Hyperlipidemia, unspecified: Secondary | ICD-10-CM

## 2016-11-08 NOTE — Telephone Encounter (Signed)
Follow up    Pt wife is calling back about this blood work.

## 2016-11-08 NOTE — Telephone Encounter (Signed)
New Message    PCP would not order kidney function test, you would have to order it if you want it done

## 2016-11-08 NOTE — Telephone Encounter (Signed)
Called, spoke with Romie Minus (on DPR). Wife informed pt's PCP will not order lab work with out seeing pt first. Will check with Dr. Lovena Le to get lab orders and contact pt back with recommendation.

## 2016-11-09 ENCOUNTER — Ambulatory Visit (INDEPENDENT_AMBULATORY_CARE_PROVIDER_SITE_OTHER): Payer: BLUE CROSS/BLUE SHIELD | Admitting: *Deleted

## 2016-11-09 DIAGNOSIS — I495 Sick sinus syndrome: Secondary | ICD-10-CM | POA: Diagnosis not present

## 2016-11-09 NOTE — Progress Notes (Signed)
Remote pacemaker transmission.   

## 2016-11-09 NOTE — Telephone Encounter (Signed)
Follow Up   Pt called back regarding husband lab order. Requesting call back

## 2016-11-10 ENCOUNTER — Encounter: Payer: Self-pay | Admitting: Cardiology

## 2016-11-10 NOTE — Telephone Encounter (Signed)
Called, pt unavailable. Left voice message. Informed when Dr. Lovena Le responds with lab orders I will contact pt. At that point, we will schedule  lab appointment. Requested call back with questions or concerns.

## 2016-11-11 LAB — CUP PACEART REMOTE DEVICE CHECK
Battery Impedance: 2462 Ohm
Battery Remaining Longevity: 20 mo
Battery Voltage: 2.73 V
Brady Statistic RV Percent Paced: 97 %
Implantable Lead Implant Date: 19950526
Implantable Lead Implant Date: 19950526
Implantable Lead Location: 753859
Implantable Lead Location: 753860
Implantable Lead Serial Number: 55516
Implantable Pulse Generator Implant Date: 20100715
Lead Channel Impedance Value: 519 Ohm
Lead Channel Impedance Value: 67 Ohm
Lead Channel Pacing Threshold Amplitude: 1.5 V
Lead Channel Pacing Threshold Pulse Width: 0.4 ms
Lead Channel Setting Pacing Pulse Width: 0.4 ms
MDC IDC LEAD SERIAL: 55140
MDC IDC SESS DTM: 20180307132035
MDC IDC SET LEADCHNL RV PACING AMPLITUDE: 3.75 V
MDC IDC SET LEADCHNL RV SENSING SENSITIVITY: 2 mV

## 2016-11-11 NOTE — Telephone Encounter (Signed)
Obtain a BMP and a CBC. GT

## 2016-11-21 NOTE — Telephone Encounter (Signed)
Labs ordered, appt made, patient notified.

## 2016-11-23 ENCOUNTER — Other Ambulatory Visit: Payer: BLUE CROSS/BLUE SHIELD | Admitting: *Deleted

## 2016-11-23 DIAGNOSIS — E785 Hyperlipidemia, unspecified: Secondary | ICD-10-CM

## 2016-11-24 LAB — BASIC METABOLIC PANEL
BUN / CREAT RATIO: 15 (ref 10–24)
BUN: 20 mg/dL (ref 8–27)
CHLORIDE: 99 mmol/L (ref 96–106)
CO2: 23 mmol/L (ref 18–29)
Calcium: 9.7 mg/dL (ref 8.6–10.2)
Creatinine, Ser: 1.34 mg/dL — ABNORMAL HIGH (ref 0.76–1.27)
GFR calc Af Amer: 55 mL/min/{1.73_m2} — ABNORMAL LOW (ref >59)
GFR calc non Af Amer: 48 mL/min/{1.73_m2} — ABNORMAL LOW (ref >59)
Glucose: 98 mg/dL (ref 65–99)
Potassium: 3.9 mmol/L (ref 3.5–5.2)
SODIUM: 138 mmol/L (ref 134–144)

## 2016-11-24 LAB — CBC WITH DIFFERENTIAL/PLATELET
BASOS ABS: 0 10*3/uL (ref 0.0–0.2)
Basos: 1 %
EOS (ABSOLUTE): 0.2 10*3/uL (ref 0.0–0.4)
Eos: 3 %
HEMATOCRIT: 38.6 % (ref 37.5–51.0)
HEMOGLOBIN: 13.4 g/dL (ref 13.0–17.7)
Immature Grans (Abs): 0 10*3/uL (ref 0.0–0.1)
Immature Granulocytes: 0 %
LYMPHS ABS: 1.3 10*3/uL (ref 0.7–3.1)
Lymphs: 21 %
MCH: 34.7 pg — AB (ref 26.6–33.0)
MCHC: 34.7 g/dL (ref 31.5–35.7)
MCV: 100 fL — ABNORMAL HIGH (ref 79–97)
MONOCYTES: 19 %
Monocytes Absolute: 1.2 10*3/uL — ABNORMAL HIGH (ref 0.1–0.9)
NEUTROS ABS: 3.6 10*3/uL (ref 1.4–7.0)
Neutrophils: 56 %
Platelets: 140 10*3/uL — ABNORMAL LOW (ref 150–379)
RBC: 3.86 x10E6/uL — ABNORMAL LOW (ref 4.14–5.80)
RDW: 14.1 % (ref 12.3–15.4)
WBC: 6.3 10*3/uL (ref 3.4–10.8)

## 2016-11-24 MED ORDER — RIVAROXABAN 20 MG PO TABS: ORAL_TABLET | ORAL | 1 refills | Status: DC

## 2016-11-24 NOTE — Telephone Encounter (Signed)
Pt came in for f/u labs. SCr 1.34, CrCl 51.25m/min. Pt is ok to remain on Xarelto '20mg'$  daily dose. [place] - called pt and phone kept ringing, no voicemail set up. Will attempt again later.

## 2017-02-08 ENCOUNTER — Ambulatory Visit (INDEPENDENT_AMBULATORY_CARE_PROVIDER_SITE_OTHER): Payer: BLUE CROSS/BLUE SHIELD | Admitting: *Deleted

## 2017-02-08 DIAGNOSIS — I495 Sick sinus syndrome: Secondary | ICD-10-CM

## 2017-02-08 NOTE — Progress Notes (Signed)
Remote pacemaker transmission.   

## 2017-02-10 LAB — CUP PACEART REMOTE DEVICE CHECK
Battery Impedance: 2554 Ohm
Battery Remaining Longevity: 17 mo
Battery Voltage: 2.73 V
Brady Statistic RV Percent Paced: 96 %
Date Time Interrogation Session: 20180606122349
Implantable Lead Implant Date: 19950526
Implantable Lead Location: 753859
Implantable Lead Location: 753860
Implantable Lead Serial Number: 55140
Implantable Pulse Generator Implant Date: 20100715
Lead Channel Impedance Value: 67 Ohm
Lead Channel Setting Pacing Pulse Width: 0.4 ms
Lead Channel Setting Sensing Sensitivity: 2 mV
MDC IDC LEAD IMPLANT DT: 19950526
MDC IDC LEAD SERIAL: 55516
MDC IDC MSMT LEADCHNL RV IMPEDANCE VALUE: 503 Ohm
MDC IDC MSMT LEADCHNL RV PACING THRESHOLD AMPLITUDE: 2.25 V
MDC IDC MSMT LEADCHNL RV PACING THRESHOLD PULSEWIDTH: 0.4 ms
MDC IDC SET LEADCHNL RV PACING AMPLITUDE: 4.5 V

## 2017-02-14 ENCOUNTER — Encounter: Payer: Self-pay | Admitting: Cardiology

## 2017-05-05 ENCOUNTER — Other Ambulatory Visit: Payer: Self-pay | Admitting: *Deleted

## 2017-05-05 MED ORDER — RIVAROXABAN 20 MG PO TABS: ORAL_TABLET | ORAL | 0 refills | Status: DC

## 2017-05-10 ENCOUNTER — Ambulatory Visit (INDEPENDENT_AMBULATORY_CARE_PROVIDER_SITE_OTHER): Payer: BLUE CROSS/BLUE SHIELD | Admitting: *Deleted

## 2017-05-10 DIAGNOSIS — I495 Sick sinus syndrome: Secondary | ICD-10-CM

## 2017-05-11 NOTE — Progress Notes (Signed)
Remote pacemaker transmission.   

## 2017-05-16 ENCOUNTER — Encounter: Payer: Self-pay | Admitting: Cardiology

## 2017-05-23 LAB — CUP PACEART REMOTE DEVICE CHECK
Battery Impedance: 3079 Ohm
Brady Statistic RV Percent Paced: 96 %
Implantable Lead Implant Date: 19950526
Implantable Lead Location: 753859
Implantable Lead Location: 753860
Implantable Lead Serial Number: 55140
Implantable Lead Serial Number: 55516
Implantable Pulse Generator Implant Date: 20100715
Lead Channel Impedance Value: 518 Ohm
Lead Channel Impedance Value: 67 Ohm
Lead Channel Pacing Threshold Amplitude: 1.625 V
Lead Channel Pacing Threshold Pulse Width: 0.4 ms
Lead Channel Setting Pacing Amplitude: 4 V
Lead Channel Setting Pacing Pulse Width: 0.4 ms
MDC IDC LEAD IMPLANT DT: 19950526
MDC IDC MSMT BATTERY REMAINING LONGEVITY: 14 mo
MDC IDC MSMT BATTERY VOLTAGE: 2.73 V
MDC IDC SESS DTM: 20180905135255
MDC IDC SET LEADCHNL RV SENSING SENSITIVITY: 2.8 mV

## 2017-08-04 ENCOUNTER — Ambulatory Visit: Payer: BLUE CROSS/BLUE SHIELD | Admitting: Pulmonary Disease

## 2017-08-04 ENCOUNTER — Encounter: Payer: Self-pay | Admitting: Pulmonary Disease

## 2017-08-04 VITALS — BP 122/78 | HR 74 | Ht 72.0 in | Wt 210.2 lb

## 2017-08-04 DIAGNOSIS — R0602 Shortness of breath: Secondary | ICD-10-CM

## 2017-08-04 DIAGNOSIS — R05 Cough: Secondary | ICD-10-CM

## 2017-08-04 DIAGNOSIS — J301 Allergic rhinitis due to pollen: Secondary | ICD-10-CM

## 2017-08-04 DIAGNOSIS — J411 Mucopurulent chronic bronchitis: Secondary | ICD-10-CM

## 2017-08-04 DIAGNOSIS — R059 Cough, unspecified: Secondary | ICD-10-CM

## 2017-08-04 NOTE — Progress Notes (Signed)
   Subjective:    Patient ID: Tyrus Wilms, male    DOB: [birth year], 83 y.o.   MRN: [id number]  HPI    Review of Systems  Constitutional: Negative for fever and unexpected weight change.  HENT: Positive for congestion, ear pain and sinus pressure. Negative for dental problem, nosebleeds, postnasal drip, rhinorrhea, sneezing, sore throat and trouble swallowing.   Eyes: Negative for redness and itching.  Respiratory: Positive for cough, chest tightness and wheezing. Negative for shortness of breath.   Cardiovascular: Negative for palpitations and leg swelling.  Gastrointestinal: Negative for nausea and vomiting.  Genitourinary: Negative for dysuria.  Musculoskeletal: Negative for joint swelling.  Skin: Negative for rash.  Allergic/Immunologic: Negative.  Negative for environmental allergies, food allergies and immunocompromised state.  Neurological: Negative for headaches.  Hematological: Bruises/bleeds easily.  Psychiatric/Behavioral: Negative for dysphoric mood. The patient is not nervous/anxious.        Objective:   Physical Exam        Assessment & Plan:

## 2017-08-04 NOTE — Progress Notes (Signed)
Subjective:   PATIENT ID: Robert [name] GENDER: male DOB: 12-05-1928, MRN: [id number]  Synopsis:Referred in November 2018 for Cough with Mucus production   HPI  Chief Complaint  Patient presents with  . pulm consult    Pt chronic productive cough- yellow thick mucus. Some chest tightness with upper back pain.     [name] is here to see me because he has a productive cough: > he coughs and brings up a "glob of mucus at various times" > its worse in the mornings, first thing > somemtes during the daytime > he will produce a tablespoon each time, typically 3-4 times per day > It is yellow in color > it has been happening for at least 12 months > he hasn't had a bad "cold" for 3 years or more, but when he was sick it would typically give him chest symptoms (cough, congestion) > he thinks that he had something "pretty close to pneumonia" three years ago on a cruise to [place] where he was treated with IV antibiotics > he has a lot of sinus congestion as well, more in the mornings  >   His brother died of lung cancer.  His parents were healthy.  Their children are healthy except they have one child who has had recurrent bronchitis.    He denies dyspnea.  He tries to stay active and take walks regularly. He has noticed some lack of stamina, but not breathlessness.     He had lung cancer 29 years ago (March 13, 2018).  He had a pancoast tumor resected by Dr. Margret Chance in [place].   He previously worked in Neurosurgeon.  He previously smoked 1 ppd for 45 years, quit in 1990.   He has not been told that he had COPD or asthma.    Past Medical History:  Diagnosis Date  . Atrial fibrillation ([place])   . Hypothyroidism   . 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[facility])    s/p XRT and surgical removal 1990  . Pneumonia 2015  . Symptomatic bradycardia    s/p PPM 1995     Family History  Problem Relation Age of Onset  . Arrhythmia Father        Atrial fibrillation  .  Diabetes Brother        Insulin dependent  . Breast cancer Sister   . Breast cancer Sister      Social History   Socioeconomic History  . Marital status: Married    Spouse name: Not on file  . Number of children: 2  . Years of education: Not on file  . Highest education level: Not on file  Social Needs  . Financial resource strain: Not on file  . Food insecurity - worry: Not on file  . Food insecurity - inability: Not on file  . Transportation needs - medical: Not on file  . Transportation needs - non-medical: Not on file  Occupational History  . Occupation: Retired  Tobacco Use  . Smoking status: Former Smoker    Last attempt to quit: 09/05/1988    Years since quitting: 28.9  . Smokeless tobacco: Never Used  Substance and Sexual Activity  . Alcohol use: Yes    Alcohol/week: 4.2 oz    Types: 7 Standard drinks or equivalent per week    Comment: 1 drink daily  . Drug use: No  . Sexual activity: Not on file  Other Topics Concern  . Not on file  Social History  Narrative   Married     No Known Allergies   Outpatient Medications Prior to Visit  Medication Sig Dispense Refill  . acetaminophen-codeine (TYLENOL #3) 300-30 MG tablet Take 1 tablet by mouth daily as needed.    . desoximetasone (TOPICORT) 0.25 % cream as directed.     Marland Kitchen levothyroxine (SYNTHROID, LEVOTHROID) 100 MCG tablet Take 100 mcg by mouth daily.      . Multiple Vitamin (MULTIVITAMIN) tablet Take 1 tablet by mouth daily.      . rivaroxaban (XARELTO) 20 MG TABS tablet TAKE 1 TABLET BY MOUTH DAILY WITH SUPPER 90 tablet 0  . vitamin C (ASCORBIC ACID) 500 MG tablet Take 500 mg by mouth daily.     No facility-administered medications prior to visit.     Review of Systems  Constitutional: Negative for fever and weight loss.  HENT: Negative for congestion, ear pain, nosebleeds and sore throat.   Eyes: Negative for redness.  Respiratory: Negative for cough, shortness of breath and wheezing.   Cardiovascular:  Negative for palpitations, leg swelling and PND.  Gastrointestinal: Negative for nausea and vomiting.  Genitourinary: Negative for dysuria.  Skin: Negative for rash.  Neurological: Negative for headaches.  Endo/Heme/Allergies: Does not bruise/bleed easily.  Psychiatric/Behavioral: Negative for depression. The patient is not nervous/anxious.       Objective:  Physical Exam   Vitals:   08/04/17 1338  BP: 122/78  Pulse: 74  SpO2: 96%  Weight: 210 lb 3.2 oz (95.3 kg)  Height: 6' (1.829 m)    Gen: well appearing, no acute distress HENT: NCAT, OP clear, neck supple without masses Eyes: PERRL, EOMi Lymph: no cervical lymphadenopathy PULM: Few crackles R base B CV: RRR, no mgr, no JVD GI: BS+, soft, nontender, no hsm Derm: no rash or skin breakdown MSK: s/p 1st rib resectoin right upper lobe, normal bulk and tone Neuro: A&Ox4, CN II-XII intact, strength 5/5 in all 4 extremities Psyche: normal mood and affect   CBC    Component Value Date/Time   WBC 6.3 11/23/2016 1326   RBC 3.86 (L) 11/23/2016 1326   HGB 13.4 11/23/2016 1326   HCT 38.6 11/23/2016 1326   PLT 140 (L) 11/23/2016 1326   MCV 100 (H) 11/23/2016 1326   MCH 34.7 (H) 11/23/2016 1326   MCHC 34.7 11/23/2016 1326   RDW 14.1 11/23/2016 1326   LYMPHSABS 1.3 11/23/2016 1326   EOSABS 0.2 11/23/2016 1326   BASOSABS 0.0 11/23/2016 1326     Chest imaging: 2015 chest x-ray images independently reviewed showing ICD, question emphysema vs basilar scarring, s/p RUL surgery  PFT:  Labs:  Path:  Echo:  Heart Catheterization:       Assessment & Plan:   Chronic bronchitis, mucopurulent (HCC)  Allergic rhinitis due to pollen, unspecified seasonality  Discussion: [name] is here for evaluation of chronic bronchitis.  He has been producing mucus on a daily basis for at least one year.  In part this may be due to allergic rhinitis with postnasal drip.  However, this all started not long after he had a severe  respiratory infection which required IV antibiotics for treatment.  This raises concern for a condition called bronchiectasis.  The differential diagnosis includes interstitial lung disease but I think that is less likely.  He also needs to have sputum culture sent to test for atypical organisms.  His COPD is also in the differential diagnosis so we need to get a spirometry test today.  Current bronchitis, chronic bronchitis: I  am concerned he may have a condition called bronchiectasis which is a result of the severe lung infection you had a few years ago We will arrange a high-resolution CT scan of the chest to investigate this further We will check a culture of your mucus to test for your bacteria and other organisms We will check a spirometry test today to assess for COPD  Allergic rhinitis/mucus production: Use Neil Med rinses with distilled water at least twice per day using the instructions on the package. 1/2 hour after using the [facility] Med rinse, use Nasacort two puffs in each nostril once per day.  Remember that the Nasacort can take 1-2 weeks to work after regular use. Use generic zyrtec (cetirizine) every day.  If this doesn't help, then stop taking it and use chlorpheniramine-phenylephrine combination tablets.  We will see you back in 2-3 weeks to go over these results.    Current Outpatient Medications:  .  acetaminophen-codeine (TYLENOL #3) 300-30 MG tablet, Take 1 tablet by mouth daily as needed., Disp: , Rfl:  .  desoximetasone (TOPICORT) 0.25 % cream, as directed. , Disp: , Rfl:  .  levothyroxine (SYNTHROID, LEVOTHROID) 100 MCG tablet, Take 100 mcg by mouth daily.  , Disp: , Rfl:  .  Multiple Vitamin (MULTIVITAMIN) tablet, Take 1 tablet by mouth daily.  , Disp: , Rfl:  .  rivaroxaban (XARELTO) 20 MG TABS tablet, TAKE 1 TABLET BY MOUTH DAILY WITH SUPPER, Disp: 90 tablet, Rfl: 0 .  vitamin C (ASCORBIC ACID) 500 MG tablet, Take 500 mg by mouth daily., Disp: , Rfl:

## 2017-08-04 NOTE — Patient Instructions (Signed)
Current bronchitis, chronic bronchitis: I am concerned he may have a condition called bronchiectasis which is a result of the severe lung infection you had a few years ago We will arrange a high-resolution CT scan of the chest to investigate this further We will check a culture of your mucus to test for your bacteria and other organisms We will check a spirometry test today to assess for COPD  Allergic rhinitis/mucus production: Use Neil Med rinses with distilled water at least twice per day using the instructions on the package. 1/2 hour after using the [facility] Med rinse, use Nasacort two puffs in each nostril once per day.  Remember that the Nasacort can take 1-2 weeks to work after regular use. Use generic zyrtec (cetirizine) every day.  If this doesn't help, then stop taking it and use chlorpheniramine-phenylephrine combination tablets.  We will see you back in 2-3 weeks to go over these results.

## 2017-08-07 ENCOUNTER — Other Ambulatory Visit: Payer: BLUE CROSS/BLUE SHIELD

## 2017-08-07 DIAGNOSIS — R059 Cough, unspecified: Secondary | ICD-10-CM

## 2017-08-07 DIAGNOSIS — R05 Cough: Secondary | ICD-10-CM

## 2017-08-07 DIAGNOSIS — J411 Mucopurulent chronic bronchitis: Secondary | ICD-10-CM

## 2017-08-09 ENCOUNTER — Encounter: Payer: BLUE CROSS/BLUE SHIELD | Admitting: *Deleted

## 2017-08-10 LAB — RESPIRATORY CULTURE OR RESPIRATORY AND SPUTUM CULTURE
MICRO NUMBER:: 81355271
RESULT: NORMAL
SPECIMEN QUALITY: ADEQUATE

## 2017-08-21 ENCOUNTER — Ambulatory Visit (HOSPITAL_COMMUNITY)
Admission: RE | Admit: 2017-08-21 | Discharge: 2017-08-21 | Disposition: A | Discharge status: Home / Self Care | Payer: BLUE CROSS/BLUE SHIELD | Source: Ambulatory Visit | Attending: Pulmonary Disease | Admitting: Pulmonary Disease

## 2017-08-21 DIAGNOSIS — R05 Cough: Secondary | ICD-10-CM | POA: Diagnosis present

## 2017-08-21 DIAGNOSIS — J439 Emphysema, unspecified: Secondary | ICD-10-CM | POA: Insufficient documentation

## 2017-08-21 DIAGNOSIS — R059 Cough, unspecified: Secondary | ICD-10-CM

## 2017-08-21 DIAGNOSIS — D3501 Benign neoplasm of right adrenal gland: Secondary | ICD-10-CM | POA: Diagnosis not present

## 2017-08-21 DIAGNOSIS — D3502 Benign neoplasm of left adrenal gland: Secondary | ICD-10-CM | POA: Diagnosis not present

## 2017-08-21 DIAGNOSIS — I2510 Atherosclerotic heart disease of native coronary artery without angina pectoris: Secondary | ICD-10-CM | POA: Diagnosis not present

## 2017-08-22 ENCOUNTER — Ambulatory Visit: Payer: BLUE CROSS/BLUE SHIELD | Admitting: Internal Medicine

## 2017-08-22 ENCOUNTER — Encounter: Payer: Self-pay | Admitting: Internal Medicine

## 2017-08-22 VITALS — BP 130/82 | HR 68 | Ht 73.0 in | Wt 208.8 lb

## 2017-08-22 DIAGNOSIS — Z950 Presence of cardiac pacemaker: Secondary | ICD-10-CM

## 2017-08-22 DIAGNOSIS — I495 Sick sinus syndrome: Secondary | ICD-10-CM | POA: Diagnosis not present

## 2017-08-22 DIAGNOSIS — I4891 Unspecified atrial fibrillation: Secondary | ICD-10-CM | POA: Diagnosis not present

## 2017-08-22 NOTE — Patient Instructions (Addendum)
Medication Instructions:  Your physician recommends that you continue on your current medications as directed. Please refer to the Current Medication list given to you today.  Labwork: None ordered.  Testing/Procedures: None ordered.  Follow-Up: Your physician wants you to follow-up in: one year with Dr. Lovena Le.   You will receive a reminder letter in the mail two months in advance. If you don't receive a letter, please call our office to schedule the follow-up appointment.  Remote monitoring is used to monitor your Pacemaker from home. This monitoring reduces the number of office visits required to check your device to one time per year. It allows [place] to keep an eye on the functioning of your device to ensure it is working properly. You are scheduled for a device check from home on 11/21/2017. You may send your transmission at any time that day. If you have a wireless device, the transmission will be sent automatically. After your physician reviews your transmission, you will receive a postcard with your next transmission date.   Any Other Special Instructions Will Be Listed Below (If Applicable).  If you need a refill on your cardiac medications before your next appointment, please call your pharmacy.

## 2017-08-22 NOTE — Progress Notes (Signed)
HPI Robert [name] returns today for ongoing evaluation of sinus node dysfunction, chronic atrial fibrillation, status post pacemaker insertion, hypertension, and remote lung cancer.  In the interim, he has had a cold and productive cough and has undergone pulmonary evaluation.  A CT scan was done.  He denies high fevers or chills.  No syncope, and no edema. No Known Allergies   Current Outpatient Medications  Medication Sig Dispense Refill  . acetaminophen-codeine (TYLENOL #3) 300-30 MG tablet Take 1 tablet by mouth daily as needed.    . desoximetasone (TOPICORT) 0.25 % cream as directed.     Marland Kitchen levothyroxine (SYNTHROID, LEVOTHROID) 100 MCG tablet Take 100 mcg by mouth daily.      . Multiple Vitamin (MULTIVITAMIN) tablet Take 1 tablet by mouth daily.      . rivaroxaban (XARELTO) 20 MG TABS tablet TAKE 1 TABLET BY MOUTH DAILY WITH SUPPER 90 tablet 0  . vitamin C (ASCORBIC ACID) 500 MG tablet Take 500 mg by mouth daily.     No current facility-administered medications for this visit.      Past Medical History:  Diagnosis Date  . Atrial fibrillation ([place])   . Hypothyroidism   . 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 Pneumonia 2015  . Symptomatic bradycardia    s/p PPM 1995    ROS:   All systems reviewed and negative except as noted in the HPI.   Past Surgical History:  Procedure Laterality Date  . CATARACT EXTRACTION, BILATERAL    . LUNG REMOVAL, PARTIAL    . PACEMAKER INSERTION  1995     Family History  Problem Relation Age of Onset  . Arrhythmia Father        Atrial fibrillation  . Diabetes Brother        Insulin dependent  . Breast cancer Sister   . Breast cancer Sister      Social History   Socioeconomic History  . Marital status: Married    Spouse name: Not on file  . Number of children: 2  . Years of education: Not on file  . Highest education level: Not on file  Social Needs  . Financial resource strain: Not on file  .  Food insecurity - worry: Not on file  . Food insecurity - inability: Not on file  . Transportation needs - medical: Not on file  . Transportation needs - non-medical: Not on file  Occupational History  . Occupation: Retired  Tobacco Use  . Smoking status: Former Smoker    Last attempt to quit: 09/05/1988    Years since quitting: 28.9  . Smokeless tobacco: Never Used  Substance and Sexual Activity  . Alcohol use: Yes    Alcohol/week: 4.2 oz    Types: 7 Standard drinks or equivalent per week    Comment: 1 drink daily  . Drug use: No  . Sexual activity: Not on file  Other Topics Concern  . Not on file  Social History Narrative   Married     BP 130/82   Pulse 68   Ht 6\' 1"  (1.854 m)   Wt 208 lb 12.8 oz (94.7 kg)   SpO2 97%   BMI 27.55 kg/m   Physical Exam:  Well appearing 83 year old man, NAD HEENT: Unremarkable Neck:  No JVD, no thyromegally Lymphatics:  No adenopathy Back:  No CVA tenderness Lungs:  Clear, with no wheezes, rales, or rhonchi HEART:  Regular rate rhythm, no  murmurs, no rubs, no clicks Abd:  soft, positive bowel sounds, no organomegally, no rebound, no guarding Ext:  2 plus pulses, no edema, no cyanosis, no clubbing Skin:  No rashes no nodules Neuro:  CN II through XII intact, motor grossly intact   DEVICE  Normal device function.  See PaceArt for details.   Assess/Plan: 1.  High-grade heart block -he is status post pacemaker insertion and asymptomatic. 2.  Atrial fibrillation - he is chronically in atrial fibrillationand denies palpitations or other symptoms. 3.  Pacemaker -his Medtronic single-chamber pacemaker is working normally.  He has less than a year of battery longevity.  His pacing threshold bipolar and the RV is elevated somewhat.  We will consider placing a new lead in the future if no other options are available.  Crissie Sickles, MD

## 2017-08-23 ENCOUNTER — Encounter: Payer: Self-pay | Admitting: Pulmonary Disease

## 2017-08-23 ENCOUNTER — Ambulatory Visit: Payer: BLUE CROSS/BLUE SHIELD | Admitting: Pulmonary Disease

## 2017-08-23 VITALS — BP 130/74 | HR 69 | Ht 73.0 in | Wt 209.0 lb

## 2017-08-23 DIAGNOSIS — R911 Solitary pulmonary nodule: Secondary | ICD-10-CM | POA: Diagnosis not present

## 2017-08-23 DIAGNOSIS — J301 Allergic rhinitis due to pollen: Secondary | ICD-10-CM | POA: Diagnosis not present

## 2017-08-23 DIAGNOSIS — J849 Interstitial pulmonary disease, unspecified: Secondary | ICD-10-CM | POA: Diagnosis not present

## 2017-08-23 LAB — CUP PACEART INCLINIC DEVICE CHECK
Battery Impedance: 3177 Ohm
Battery Remaining Longevity: 12 mo
Battery Voltage: 2.71 V
Date Time Interrogation Session: 20181218165357
Implantable Lead Implant Date: 19950526
Implantable Lead Location: 753859
Implantable Lead Location: 753860
Implantable Pulse Generator Implant Date: 20100715
Lead Channel Impedance Value: 67 Ohm
Lead Channel Pacing Threshold Pulse Width: 0.4 ms
Lead Channel Setting Pacing Amplitude: 4.5 V
Lead Channel Setting Pacing Pulse Width: 0.4 ms
MDC IDC LEAD IMPLANT DT: 19950526
MDC IDC LEAD SERIAL: 55140
MDC IDC LEAD SERIAL: 55516
MDC IDC MSMT LEADCHNL RV IMPEDANCE VALUE: 505 Ohm
MDC IDC MSMT LEADCHNL RV PACING THRESHOLD AMPLITUDE: 2.25 V
MDC IDC MSMT LEADCHNL RV PACING THRESHOLD AMPLITUDE: 2.25 V
MDC IDC MSMT LEADCHNL RV PACING THRESHOLD PULSEWIDTH: 0.4 ms
MDC IDC MSMT LEADCHNL RV SENSING INTR AMPL: 5.6 mV
MDC IDC SET LEADCHNL RV SENSING SENSITIVITY: 2.8 mV
MDC IDC STAT BRADY RV PERCENT PACED: 96 %

## 2017-08-23 NOTE — Patient Instructions (Signed)
Allergic rhinitis: Keep taking Zyrtec daily as you are doing  Nonspecific fibrotic changes in the right lower lobe with bronchiectasis: I think this is a result of your recent episode of pneumonia 3 years ago We will repeat a CT chest in 1 year, if you have problems with bronchitis or respiratory complaints before that please come back to see me sooner  Pulmonary nodule: Again, I think this is a benign finding but we need to follow-up with a repeat CT scan in 1 year  I will see you back in 1 year or sooner if needed

## 2017-08-23 NOTE — Progress Notes (Signed)
Subjective:   PATIENT ID: JR,Robert [name] GENDER: male DOB: 12-Jan-1929, MRN: [id number]  Synopsis:Referred in November 2018 for Cough with Mucus production;  He smoked for years, 1 pack per day for 30 years until he had lung cancer that was resected in [place] in 1990.   HPI  Chief Complaint  Patient presents with  . Follow-up    pt reports of occ prod cough with yellow mucus.    He is not coughing up any mucus right now.  The zyrtec really worked well.  No cough now.  He says that the Zyrtec really help.  No shortness of breath right now.  No recent fevers or chills no recent bronchitis.  He is here to go over the results of his CT scan.  Past Medical History:  Diagnosis Date  . Atrial fibrillation ([facility])   . Hypothyroidism   . 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[facility])    s/p XRT and surgical removal 1990  . Pneumonia 2015  . Symptomatic bradycardia    s/p PPM 1995      Review of Systems  Constitutional: Negative for fever and weight loss.  HENT: Negative for congestion, ear pain, nosebleeds and sore throat.   Eyes: Negative for redness.  Respiratory: Negative for cough, shortness of breath and wheezing.   Cardiovascular: Negative for palpitations, leg swelling and PND.  Gastrointestinal: Negative for nausea and vomiting.  Genitourinary: Negative for dysuria.  Skin: Negative for rash.  Neurological: Negative for headaches.  Endo/Heme/Allergies: Does not bruise/bleed easily.  Psychiatric/Behavioral: Negative for depression. The patient is not nervous/anxious.       Objective:  Physical Exam   Vitals:   08/23/17 1434  BP: 130/74  Pulse: 69  SpO2: 98%  Weight: 209 lb (94.8 kg)  Height: 6\' 1"  (1.854 m)    Gen: well appearing HENT: OP clear, TM's clear, neck supple PULM: Few crackles R base B, normal percussion CV: RRR, no mgr, trace edema GI: BS+, soft, nontender Derm: no cyanosis or rash Psyche: normal mood and affect    CBC    Component Value Date/Time   WBC 6.3  11/23/2016 1326   RBC 3.86 (L) 11/23/2016 1326   HGB 13.4 11/23/2016 1326   HCT 38.6 11/23/2016 1326   PLT 140 (L) 11/23/2016 1326   MCV 100 (H) 11/23/2016 1326   MCH 34.7 (H) 11/23/2016 1326   MCHC 34.7 11/23/2016 1326   RDW 14.1 11/23/2016 1326   LYMPHSABS 1.3 11/23/2016 1326   EOSABS 0.2 11/23/2016 1326   BASOSABS 0.0 11/23/2016 1326     Chest imaging: 2015 chest x-ray images independently reviewed showing ICD, question emphysema vs basilar scarring, s/p RUL surgery 2018 high-resolution CT chest images independently reviewed showing borderline airway size in the bases bilaterally, patulous esophagus with air-fluid level, paraseptal emphysema and some mild centrilobular emphysema in the upper lobes, nonspecific intralobular fibrotic changes in a peripheral and basilar distribution, overall indeterminate for UIP  PFT: November 2018 spirometry test showed no airflow obstruction, FVC 3.19 L 78% predicted  Labs:  Path:  Echo:  Heart Catheterization:  Micro:        Assessment & Plan:   No diagnosis found.  Discussion: Fortunately steroid is doing much better now that we have treated his allergic rhinitis.  In the process we ordered a CT scan which shows nonspecific fibrotic changes in the right base with some bronchiectasis.  I think this is most likely related to his prior episode of pneumonia though given his age group this  needs to be followed to make sure it does not progress into something more ominous.  As he has no respiratory complaints right now I do not think we need to do anything more about this other than repeat a CT in a year.  Of note, he may also have some tendency to aspirate as he has right lower lobe bronchiectasis and nonspecific fibrotic changes and a patulous esophagus.  Because he is asymptomatic I do not see a reason to do anything different about this right now.  Plan: Allergic rhinitis: Keep taking Zyrtec daily as you are doing  Nonspecific  fibrotic changes in the right lower lobe with bronchiectasis: I think this is a result of your recent episode of pneumonia 3 years ago We will repeat a CT chest in 1 year, if you have problems with bronchitis or respiratory complaints before that please come back to see me sooner  Pulmonary nodule: Again, I think this is a benign finding but we need to follow-up with a repeat CT scan in 1 year  I will see you back in 1 year or sooner if needed  26 minutes spent on today's visit     Current Outpatient Medications:  .  acetaminophen-codeine (TYLENOL #3) 300-30 MG tablet, Take 1 tablet by mouth daily as needed., Disp: , Rfl:  .  desoximetasone (TOPICORT) 0.25 % cream, as directed. , Disp: , Rfl:  .  levothyroxine (SYNTHROID, LEVOTHROID) 100 MCG tablet, Take 100 mcg by mouth daily.  , Disp: , Rfl:  .  Multiple Vitamin (MULTIVITAMIN) tablet, Take 1 tablet by mouth daily.  , Disp: , Rfl:  .  rivaroxaban (XARELTO) 20 MG TABS tablet, TAKE 1 TABLET BY MOUTH DAILY WITH SUPPER, Disp: 90 tablet, Rfl: 0 .  vitamin C (ASCORBIC ACID) 500 MG tablet, Take 500 mg by mouth daily., Disp: , Rfl:

## 2017-09-21 LAB — MYCOBACTERIA,CULT W/FLUOROCHROME SMEAR
MICRO NUMBER: 81355269
SMEAR: NONE SEEN
SPECIMEN QUALITY:: ADEQUATE

## 2017-11-21 ENCOUNTER — Telehealth: Payer: Self-pay | Admitting: Internal Medicine

## 2017-11-21 ENCOUNTER — Telehealth: Payer: Self-pay | Admitting: Cardiology

## 2017-11-21 ENCOUNTER — Ambulatory Visit (INDEPENDENT_AMBULATORY_CARE_PROVIDER_SITE_OTHER): Payer: BLUE CROSS/BLUE SHIELD | Admitting: *Deleted

## 2017-11-21 DIAGNOSIS — I495 Sick sinus syndrome: Secondary | ICD-10-CM | POA: Diagnosis not present

## 2017-11-21 NOTE — Progress Notes (Signed)
Remote pacemaker transmission.   

## 2017-11-21 NOTE — Telephone Encounter (Signed)
Robert [name] is calling to verify if a transmission has been received today. Thanks.

## 2017-11-21 NOTE — Telephone Encounter (Signed)
Spoke w/ pt and informed him that his remote transmission was received. Pt verbalized understanding.  

## 2017-11-21 NOTE — Telephone Encounter (Signed)
Confirmed remote transmission w/ pt wife.   

## 2017-11-22 ENCOUNTER — Encounter: Payer: Self-pay | Admitting: Cardiology

## 2017-12-08 LAB — CUP PACEART REMOTE DEVICE CHECK
Battery Remaining Longevity: 5 mo
Battery Voltage: 2.64 V
Implantable Lead Implant Date: 19950526
Implantable Lead Location: 753860
Implantable Lead Serial Number: 55516
Implantable Pulse Generator Implant Date: 20100715
Lead Channel Impedance Value: 511 Ohm
Lead Channel Pacing Threshold Amplitude: 2.5 V
Lead Channel Pacing Threshold Pulse Width: 0.4 ms
Lead Channel Setting Pacing Pulse Width: 0.76 ms
MDC IDC LEAD IMPLANT DT: 19950526
MDC IDC LEAD LOCATION: 753859
MDC IDC LEAD SERIAL: 55140
MDC IDC MSMT BATTERY IMPEDANCE: 3498 Ohm
MDC IDC MSMT LEADCHNL RA IMPEDANCE VALUE: 67 Ohm
MDC IDC SESS DTM: 20190319172216
MDC IDC SET LEADCHNL RV PACING AMPLITUDE: 4.5 V
MDC IDC SET LEADCHNL RV SENSING SENSITIVITY: 2 mV
MDC IDC STAT BRADY RV PERCENT PACED: 95 %

## 2017-12-18 ENCOUNTER — Other Ambulatory Visit: Payer: Self-pay | Admitting: *Deleted

## 2017-12-18 NOTE — Telephone Encounter (Addendum)
Xarelto 20mg  refill request received; pt is 84 yrs old, Wt-94.8kg, last seen by Dr. Lovena Le on 08/22/17, Crea-1.34 on 11/16/16, therefore, labs are overdue and called PCP office & spoke with June she stated they had some labs done in June 2018 & she will fax over, will await to calculate CrCl before sending in RX. Received labs from PCP & Crea-1.46 on 02/20/17, CrCl-47.67ml/min; will send a message to Dr. Lovena Le regarding pt needing a dose change per dosing criteria. Message sent to Dr. Lovena Le on 12/18/17 at 413pm

## 2017-12-21 ENCOUNTER — Telehealth: Payer: Self-pay | Admitting: *Deleted

## 2017-12-21 ENCOUNTER — Telehealth: Payer: Self-pay | Admitting: Internal Medicine

## 2017-12-21 MED ORDER — RIVAROXABAN 15 MG PO TABS: 15.0000 mg | ORAL_TABLET | Freq: Every day | ORAL | 1 refills | Status: DC

## 2017-12-21 NOTE — Telephone Encounter (Signed)
Pt's dose changed from Xarelto 20mg  to Xarelto 15mg  daily. Prescription sent to Pharmacy as requested.

## 2017-12-21 NOTE — Telephone Encounter (Signed)
-----   Message from Evans Lance, MD sent at 12/21/2017 11:57 AM EDT ----- Please change to 15 mg daily. GT ----- Message ----- From: Marcos Eke, RN Sent: 12/18/2017   3:59 PM To: Evans Lance, MD  Claudia Pollock 20mg  refill request received; pt is 84 yrs old, Wt-94.8kg, labs from PCP sent over and Creatinine was 1.46 on 02/20/17, CrCl-47.74ml/min; last creatinine on 11/23/16 was 1.34 and CrCl was 52.24ml/min. Please advise if you would like the dose changed since current CrCl is 47.65ml/min.  Thanks,  Derrel Nip, RN

## 2017-12-21 NOTE — Telephone Encounter (Signed)
Called and spoke with pt regarding the dose being changed from Xarelto 20mg  to Xarelto 15mg  per Dr. Lovena Le due to dosing criteria. Pt is aware to take Xarelto 15mg  daily with largest meal of the day and to call with any issues. Confirmed that the refill was being sent to the correct pharmacy. Removed Xarelto 20mg  off med list at this time, too.

## 2017-12-21 NOTE — Telephone Encounter (Signed)
New Message    *STAT* If patient is at the pharmacy, call can be transferred to refill team.   1. Which medications need to be refilled? (please list name of each medication and dose if known) rivaroxaban (XARELTO) 20 MG TABS tablet  2. Which pharmacy/location (including street and city if local pharmacy) [facility]  3. Do they need a 30 day or 90 day supply? 90 day

## 2017-12-21 NOTE — Telephone Encounter (Signed)
Please read the message from Dr. Lovena Le on 12/21/17 regarding Xarelto dose change from 20mg  to 15mg  daily. Refill sent to pharmacy.

## 2017-12-26 ENCOUNTER — Ambulatory Visit (INDEPENDENT_AMBULATORY_CARE_PROVIDER_SITE_OTHER): Payer: Self-pay | Admitting: *Deleted

## 2017-12-26 DIAGNOSIS — I495 Sick sinus syndrome: Secondary | ICD-10-CM

## 2017-12-26 NOTE — Progress Notes (Signed)
Remote pacemaker transmission.   

## 2017-12-27 ENCOUNTER — Encounter: Payer: Self-pay | Admitting: Cardiology

## 2017-12-29 LAB — CUP PACEART REMOTE DEVICE CHECK
Battery Impedance: 3565 Ohm
Battery Remaining Longevity: 16 mo
Date Time Interrogation Session: 20190423132835
Implantable Lead Implant Date: 19950526
Implantable Lead Implant Date: 19950526
Implantable Lead Serial Number: 55140
Implantable Lead Serial Number: 55516
Implantable Pulse Generator Implant Date: 20100715
Lead Channel Pacing Threshold Pulse Width: 0.4 ms
Lead Channel Setting Pacing Pulse Width: 0.4 ms
Lead Channel Setting Sensing Sensitivity: 2 mV
MDC IDC LEAD LOCATION: 753859
MDC IDC LEAD LOCATION: 753860
MDC IDC MSMT BATTERY VOLTAGE: 2.72 V
MDC IDC MSMT LEADCHNL RA IMPEDANCE VALUE: 67 Ohm
MDC IDC MSMT LEADCHNL RV IMPEDANCE VALUE: 510 Ohm
MDC IDC MSMT LEADCHNL RV PACING THRESHOLD AMPLITUDE: 2 V
MDC IDC SET LEADCHNL RV PACING AMPLITUDE: 3 V
MDC IDC STAT BRADY RV PERCENT PACED: 95 %

## 2018-01-26 ENCOUNTER — Ambulatory Visit (INDEPENDENT_AMBULATORY_CARE_PROVIDER_SITE_OTHER): Payer: Self-pay | Admitting: *Deleted

## 2018-01-26 DIAGNOSIS — I4891 Unspecified atrial fibrillation: Secondary | ICD-10-CM

## 2018-01-26 DIAGNOSIS — I495 Sick sinus syndrome: Secondary | ICD-10-CM

## 2018-01-26 NOTE — Progress Notes (Signed)
Remote pacemaker transmission.   

## 2018-01-30 ENCOUNTER — Encounter: Payer: Self-pay | Admitting: Cardiology

## 2018-01-30 NOTE — Addendum Note (Signed)
Addended by: Tiajuana Amass on: 01/30/2018 09:29 AM   Modules accepted: Level of Service

## 2018-02-20 LAB — CUP PACEART REMOTE DEVICE CHECK
Battery Impedance: 3791 Ohm
Battery Remaining Longevity: 12 mo
Battery Voltage: 2.72 V
Brady Statistic RV Percent Paced: 96 %
Date Time Interrogation Session: 20190524122601
Implantable Lead Implant Date: 19950526
Implantable Lead Location: 753859
Implantable Lead Serial Number: 55140
Lead Channel Impedance Value: 508 Ohm
Lead Channel Impedance Value: 67 Ohm
Lead Channel Pacing Threshold Amplitude: 2.25 V
Lead Channel Setting Pacing Pulse Width: 0.4 ms
Lead Channel Setting Sensing Sensitivity: 2 mV
MDC IDC LEAD IMPLANT DT: 19950526
MDC IDC LEAD LOCATION: 753860
MDC IDC LEAD SERIAL: 55516
MDC IDC MSMT LEADCHNL RV PACING THRESHOLD PULSEWIDTH: 0.4 ms
MDC IDC PG IMPLANT DT: 20100715
MDC IDC SET LEADCHNL RV PACING AMPLITUDE: 3.5 V

## 2018-03-02 ENCOUNTER — Ambulatory Visit (INDEPENDENT_AMBULATORY_CARE_PROVIDER_SITE_OTHER): Payer: BLUE CROSS/BLUE SHIELD | Admitting: *Deleted

## 2018-03-02 DIAGNOSIS — I495 Sick sinus syndrome: Secondary | ICD-10-CM

## 2018-03-02 LAB — CUP PACEART REMOTE DEVICE CHECK
Battery Voltage: 2.64 V
Implantable Lead Implant Date: 19950526
Implantable Lead Serial Number: 55516
Implantable Pulse Generator Implant Date: 20100715
Lead Channel Impedance Value: 507 Ohm
Lead Channel Setting Pacing Pulse Width: 1 ms
MDC IDC LEAD IMPLANT DT: 19950526
MDC IDC LEAD LOCATION: 753859
MDC IDC LEAD LOCATION: 753860
MDC IDC LEAD SERIAL: 55140
MDC IDC MSMT BATTERY IMPEDANCE: 4038 Ohm
MDC IDC MSMT BATTERY REMAINING LONGEVITY: 1 mo — AB
MDC IDC MSMT LEADCHNL RA IMPEDANCE VALUE: 67 Ohm
MDC IDC SESS DTM: 20190628141438
MDC IDC SET LEADCHNL RV PACING AMPLITUDE: 5 V
MDC IDC SET LEADCHNL RV SENSING SENSITIVITY: 2.8 mV
MDC IDC STAT BRADY RV PERCENT PACED: 96 %

## 2018-03-02 NOTE — Progress Notes (Signed)
Remote pacemaker transmission.   

## 2018-03-30 ENCOUNTER — Ambulatory Visit (INDEPENDENT_AMBULATORY_CARE_PROVIDER_SITE_OTHER): Payer: BLUE CROSS/BLUE SHIELD | Admitting: *Deleted

## 2018-03-30 DIAGNOSIS — I495 Sick sinus syndrome: Secondary | ICD-10-CM

## 2018-03-30 DIAGNOSIS — I4891 Unspecified atrial fibrillation: Secondary | ICD-10-CM

## 2018-03-30 NOTE — Progress Notes (Signed)
Remote pacemaker transmission.   

## 2018-04-03 NOTE — Addendum Note (Signed)
Addended by: Tiajuana Amass on: 04/03/2018 11:12 AM   Modules accepted: Level of Service

## 2018-04-30 ENCOUNTER — Ambulatory Visit (INDEPENDENT_AMBULATORY_CARE_PROVIDER_SITE_OTHER): Payer: BLUE CROSS/BLUE SHIELD | Admitting: *Deleted

## 2018-04-30 DIAGNOSIS — I495 Sick sinus syndrome: Secondary | ICD-10-CM

## 2018-04-30 NOTE — Progress Notes (Signed)
Remote pacemaker transmission.   

## 2018-05-01 ENCOUNTER — Encounter: Payer: Self-pay | Admitting: Cardiology

## 2018-05-02 ENCOUNTER — Telehealth: Payer: Self-pay | Admitting: Pulmonary Disease

## 2018-05-02 ENCOUNTER — Ambulatory Visit: Payer: BLUE CROSS/BLUE SHIELD | Admitting: Pulmonary Disease

## 2018-05-02 ENCOUNTER — Encounter: Payer: Self-pay | Admitting: Pulmonary Disease

## 2018-05-02 VITALS — BP 132/84 | HR 87 | Ht 70.87 in | Wt 202.0 lb

## 2018-05-02 DIAGNOSIS — R0602 Shortness of breath: Secondary | ICD-10-CM

## 2018-05-02 DIAGNOSIS — J479 Bronchiectasis, uncomplicated: Secondary | ICD-10-CM | POA: Diagnosis not present

## 2018-05-02 DIAGNOSIS — J432 Centrilobular emphysema: Secondary | ICD-10-CM

## 2018-05-02 MED ORDER — FLUTTER DEVI: 1.0000 | Freq: Once | 0 refills | Status: AC

## 2018-05-02 MED ORDER — SODIUM CHLORIDE 3 % IN NEBU: INHALATION_SOLUTION | Freq: Two times a day (BID) | RESPIRATORY_TRACT | 12 refills | Status: DC | PRN

## 2018-05-02 MED ORDER — ALBUTEROL SULFATE (2.5 MG/3ML) 0.083% IN NEBU: 2.5000 mg | INHALATION_SOLUTION | Freq: Four times a day (QID) | RESPIRATORY_TRACT | 12 refills | Status: DC | PRN

## 2018-05-02 MED ORDER — FLUTTER DEVI: 1.0000 | Freq: Once | 0 refills | Status: DC

## 2018-05-02 NOTE — Patient Instructions (Signed)
Bronchiectasis: This term means that your airways are bigger and more dilated than they should be making you retain mucus You need to make an effort to clear the mucus out: Use hypertonic saline twice a day Use albuterol twice a day Use a flutter valve 4 to 5 breaths, 4-5 times a day  Shortness of breath: Try to exercise more over the next several weeks while using the medicine I recommended above  Allergic rhinitis: Try taking Flonase 2 sprays each nostril daily next line after 2 weeks of using Flonase stop taking Zyrtec to see if this is the cause of your dry mouth  Come back in 4 to 6 weeks, if your breathing has not improved then we can repeat a lung function test and a scan of your chest to see if there is something else going on

## 2018-05-02 NOTE — Telephone Encounter (Signed)
Called patient unable to reach left message to give us a call back.

## 2018-05-02 NOTE — Telephone Encounter (Signed)
Called and spoke with Patient.  The Patient stated that he has his neb, but has not picked up his nebulizer. He plans on that tomorrow.  He stated that he also would like to have Albuterol emergency inhaler.  He would like a prescription sent to [facility] in [place].  Explained that we would send a message to Dr. Lake Bells and someone would be in touch shortly.  We will route to Dr. Lake Bells

## 2018-05-02 NOTE — Telephone Encounter (Signed)
Pt is calling back [phone]

## 2018-05-02 NOTE — Telephone Encounter (Signed)
Spoke with pt, he would like an order for a nebulizer machine. I sent order and advised him that they would contact him. Nothing further is needed.

## 2018-05-02 NOTE — Progress Notes (Signed)
Subjective:   PATIENT ID: Robert [name]. GENDER: male DOB: 04-16-1929, MRN: [id number]  Synopsis:Referred in November 2018 for Cough with Mucus production;  He smoked for years, 1 pack per day for 30 years until he had lung cancer that was resected in [place] in 1990.   HPI  Chief Complaint  Patient presents with  . Follow-up    occasional coughing spells, wheezing    Robert [name] and his wife celebrate there 67 anniversary of graduating from [employer].   He moved up his appointment because he has been having more coughing spells recently.  His wife says that in the mornings he will cough up mucus.  He says that when he lay down he has more cough.  They have noticed some blood in his chest and some audible wheezing.  They keep the head of their bed elevated.  He says that some days he will feel more congested if he has a bad night.  They note that he is noticing more shortness of breath.  He hasn't been walking as much any more.  He says that all this.   Past Medical History:  Diagnosis Date  . Atrial fibrillation ([place])   . Hypothyroidism   . 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[facility])    s/p XRT and surgical removal 1990  . Pneumonia 2015  . Symptomatic bradycardia    s/p PPM 1995      Review of Systems  Constitutional: Negative for fever and weight loss.  HENT: Negative for congestion, ear pain, nosebleeds and sore throat.   Eyes: Negative for redness.  Respiratory: Negative for cough, shortness of breath and wheezing.   Cardiovascular: Negative for palpitations, leg swelling and PND.  Gastrointestinal: Negative for nausea and vomiting.  Genitourinary: Negative for dysuria.  Skin: Negative for rash.  Neurological: Negative for headaches.  Endo/Heme/Allergies: Does not bruise/bleed easily.  Psychiatric/Behavioral: Negative for depression. The patient is not nervous/anxious.       Objective:  Physical Exam   Vitals:   05/02/18 0923  BP: 132/84  Pulse: 87  SpO2: 100%  Weight:  202 lb (91.6 kg)  Height: 5' 10.87" (1.8 m)    Gen: well appearing HENT: OP clear, TM's clear, neck supple PULM: Coarse crackles RLL some wheezing bilaterally B, normal percussion CV: RRR, no mgr, trace edema GI: BS+, soft, nontender Derm: no cyanosis or rash Psyche: normal mood and affect    CBC    Component Value Date/Time   WBC 6.3 11/23/2016 1326   RBC 3.86 (L) 11/23/2016 1326   HGB 13.4 11/23/2016 1326   HCT 38.6 11/23/2016 1326   PLT 140 (L) 11/23/2016 1326   MCV 100 (H) 11/23/2016 1326   MCH 34.7 (H) 11/23/2016 1326   MCHC 34.7 11/23/2016 1326   RDW 14.1 11/23/2016 1326   LYMPHSABS 1.3 11/23/2016 1326   EOSABS 0.2 11/23/2016 1326   BASOSABS 0.0 11/23/2016 1326     Chest imaging: 2015 chest x-ray images independently reviewed showing ICD, question emphysema vs basilar scarring, s/p RUL surgery 2018 high-resolution CT chest images independently reviewed showing borderline airway size in the bases bilaterally, patulous esophagus with air-fluid level, paraseptal emphysema and some mild centrilobular emphysema in the upper lobes, nonspecific intralobular fibrotic changes in a peripheral and basilar distribution, overall indeterminate for UIP  PFT: November 2018 spirometry test showed no airflow obstruction, FVC 3.19 L 78% predicted  Labs:  Path:  Echo:  Heart Catheterization:  Micro:        Assessment & Plan:  Bronchiectasis without complication (HCC)  Shortness of breath  Centrilobular emphysema (HCC)  Discussion: Robert [name] schedule his appointment to see me sooner because he has been having more shortness of breath, cough, and wheezing.  Based on his physical exam he has wheezing and crackles in the right lower lobe, his CT scan from December showed bronchiectasis in that area.  I believe that his symptoms are largely due to mucus burden.  He needs to make an effort at mucociliary clearance.  Even though he has centrilobular emphysema his lung  function testing showed no evidence of airflow obstruction.  Plan: Bronchiectasis: This term means that your airways are bigger and more dilated than they should be making you retain mucus You need to make an effort to clear the mucus out: Use hypertonic saline twice a day Use albuterol twice a day Use a flutter valve 4 to 5 breaths, 4-5 times a day  Shortness of breath: Try to exercise more over the next several weeks while using the medicine I recommended above  Allergic rhinitis: Try taking Flonase 2 sprays each nostril daily next line after 2 weeks of using Flonase stop taking Zyrtec to see if this is the cause of your dry mouth  Come back in 4 to 6 weeks, if your breathing has not improved then we can repeat a lung function test and a scan of your chest to see if there is something else going on       Current Outpatient Medications:  .  acetaminophen-codeine (TYLENOL #3) 300-30 MG tablet, Take 1 tablet by mouth daily as needed., Disp: , Rfl:  .  cetirizine (ZYRTEC) 10 MG tablet, Take 10 mg by mouth daily., Disp: , Rfl:  .  desoximetasone (TOPICORT) 0.25 % cream, as directed. , Disp: , Rfl:  .  levothyroxine (SYNTHROID, LEVOTHROID) 100 MCG tablet, Take 100 mcg by mouth daily.  , Disp: , Rfl:  .  Multiple Vitamin (MULTIVITAMIN) tablet, Take 1 tablet by mouth daily.  , Disp: , Rfl:  .  Rivaroxaban (XARELTO) 15 MG TABS tablet, Take 1 tablet (15 mg total) by mouth daily with supper., Disp: 90 tablet, Rfl: 1 .  vitamin C (ASCORBIC ACID) 500 MG tablet, Take 500 mg by mouth daily., Disp: , Rfl:  .  albuterol (PROVENTIL) (2.5 MG/3ML) 0.083% nebulizer solution, Take 3 mLs (2.5 mg total) by nebulization every 6 (six) hours as needed for wheezing or shortness of breath., Disp: 75 mL, Rfl: 12 .  Respiratory Therapy Supplies (FLUTTER) DEVI, 1 Device by Does not apply route once for 1 dose., Disp: 1 each, Rfl: 0 .  sodium chloride HYPERTONIC 3 % nebulizer solution, Take by nebulization 2 (two)  times daily as needed for other., Disp: 750 mL, Rfl: 12

## 2018-05-03 MED ORDER — ALBUTEROL SULFATE HFA 108 (90 BASE) MCG/ACT IN AERS: 2.0000 | INHALATION_SPRAY | RESPIRATORY_TRACT | 5 refills | Status: DC | PRN

## 2018-05-03 NOTE — Telephone Encounter (Signed)
OK by me 

## 2018-05-03 NOTE — Telephone Encounter (Signed)
Spoke with pt's wife, Romie Minus. She is aware that we will send in the medication. Rx has been sent in. Nothing further was needed.

## 2018-05-08 ENCOUNTER — Telehealth: Payer: Self-pay | Admitting: Internal Medicine

## 2018-05-08 NOTE — Telephone Encounter (Signed)
New Message:    Patient is requesting  a call back

## 2018-05-08 NOTE — Telephone Encounter (Signed)
Spoke with pt informed him that he had an average of 12 months left on his pacemaker and that the range was anywhere from 1 month to 23 months but the most likely scenario is that he will get 12 months. Pt voiced understanding

## 2018-05-09 LAB — CUP PACEART REMOTE DEVICE CHECK
Battery Voltage: 2.7 V
Date Time Interrogation Session: 20190726133405
Implantable Lead Implant Date: 19950526
Implantable Lead Location: 753860
Implantable Lead Serial Number: 55140
Lead Channel Pacing Threshold Pulse Width: 0.4 ms
Lead Channel Setting Pacing Amplitude: 3.5 V
Lead Channel Setting Pacing Pulse Width: 0.4 ms
Lead Channel Setting Sensing Sensitivity: 2 mV
MDC IDC LEAD IMPLANT DT: 19950526
MDC IDC LEAD LOCATION: 753859
MDC IDC LEAD SERIAL: 55516
MDC IDC MSMT BATTERY IMPEDANCE: 4004 Ohm
MDC IDC MSMT BATTERY REMAINING LONGEVITY: 11 mo
MDC IDC MSMT LEADCHNL RA IMPEDANCE VALUE: 67 Ohm
MDC IDC MSMT LEADCHNL RV IMPEDANCE VALUE: 524 Ohm
MDC IDC MSMT LEADCHNL RV PACING THRESHOLD AMPLITUDE: 2.25 V
MDC IDC PG IMPLANT DT: 20100715
MDC IDC STAT BRADY RV PERCENT PACED: 96 %

## 2018-05-10 LAB — CUP PACEART REMOTE DEVICE CHECK
Battery Remaining Longevity: 12 mo
Battery Voltage: 2.7 V
Brady Statistic RV Percent Paced: 96 %
Date Time Interrogation Session: 20190826122850
Implantable Lead Implant Date: 19950526
Implantable Lead Implant Date: 19950526
Implantable Lead Location: 753860
Implantable Pulse Generator Implant Date: 20100715
Lead Channel Pacing Threshold Pulse Width: 0.4 ms
Lead Channel Setting Pacing Amplitude: 3.25 V
Lead Channel Setting Pacing Pulse Width: 0.4 ms
MDC IDC LEAD LOCATION: 753859
MDC IDC LEAD SERIAL: 55140
MDC IDC LEAD SERIAL: 55516
MDC IDC MSMT BATTERY IMPEDANCE: 4055 Ohm
MDC IDC MSMT LEADCHNL RA IMPEDANCE VALUE: 67 Ohm
MDC IDC MSMT LEADCHNL RV IMPEDANCE VALUE: 506 Ohm
MDC IDC MSMT LEADCHNL RV PACING THRESHOLD AMPLITUDE: 1.875 V
MDC IDC SET LEADCHNL RV SENSING SENSITIVITY: 2.8 mV

## 2018-05-31 ENCOUNTER — Ambulatory Visit (INDEPENDENT_AMBULATORY_CARE_PROVIDER_SITE_OTHER): Payer: BLUE CROSS/BLUE SHIELD | Admitting: *Deleted

## 2018-05-31 DIAGNOSIS — I495 Sick sinus syndrome: Secondary | ICD-10-CM | POA: Diagnosis not present

## 2018-05-31 NOTE — Progress Notes (Signed)
Remote pacemaker transmission.   

## 2018-06-01 ENCOUNTER — Encounter: Payer: Self-pay | Admitting: Cardiology

## 2018-06-04 ENCOUNTER — Other Ambulatory Visit: Payer: Self-pay

## 2018-06-04 NOTE — Telephone Encounter (Signed)
Routed staff message to Dr Lovena Le and Myrtie Hawk, RN.

## 2018-06-04 NOTE — Telephone Encounter (Signed)
Pt last saw Dr Lovena Le 08/22/17, last labs at Dr Juel Burrow office Creat 1.21 on 02/26/18, age 84, weight 91.6kg, CrCl 55.73.  Based on CrCl pt is not on correct dosage of Xarelto 15mg  QD.  Will forward message to Dr Lovena Le to advise if dosage change to Xarelto 20mg  QD would be appropriate for this pt. Will await response from Provider.

## 2018-06-06 ENCOUNTER — Ambulatory Visit: Payer: BLUE CROSS/BLUE SHIELD | Admitting: Pulmonary Disease

## 2018-06-06 ENCOUNTER — Telehealth: Payer: Self-pay | Admitting: *Deleted

## 2018-06-06 ENCOUNTER — Encounter: Payer: Self-pay | Admitting: Pulmonary Disease

## 2018-06-06 VITALS — BP 136/68 | HR 85 | Ht 70.67 in | Wt 197.0 lb

## 2018-06-06 DIAGNOSIS — J432 Centrilobular emphysema: Secondary | ICD-10-CM | POA: Diagnosis not present

## 2018-06-06 DIAGNOSIS — J849 Interstitial pulmonary disease, unspecified: Secondary | ICD-10-CM | POA: Diagnosis not present

## 2018-06-06 DIAGNOSIS — J479 Bronchiectasis, uncomplicated: Secondary | ICD-10-CM

## 2018-06-06 NOTE — Assessment & Plan Note (Signed)
Follow-up with our office in 3 months  >>> You declined repeat CT that was previously ordered on December/2019  Bronchiectasis: This is the medical term which indicates that you have damage, dilated airways making you more susceptible to respiratory infection. Use a flutter valve 10 breaths twice a day or 4 to 5 breaths 4-5 times a day to help clear mucus out Continue to use hypertonic saline nebs before he use her flutter valve this will help with mucus clearance Let us know if you have cough with change in mucus color or fevers or chills.  At that point you would need an antibiotic. Maintain a healthy nutritious diet, eating whole foods Take your medications as prescribed   Receive flu vaccine from primary care as planned   Continue Zyrtec daily  Only use your albuterol as a rescue medication to be used if you can't catch your breath by resting or doing a relaxed purse lip breathing pattern.  - The less you use it, the better it will work when you need it. - Ok to use up to 2 puffs  every 4 hours if you must but call for immediate appointment if use goes up over your usual need - Don't leave home without it !!  (think of it like the spare tire for your car)

## 2018-06-06 NOTE — Addendum Note (Signed)
Addended by: Simonne Maffucci B on: 06/06/2018 10:19 AM   Modules accepted: Level of Service

## 2018-06-06 NOTE — Telephone Encounter (Signed)
Left msg for pt to call back regarding the dose change. Will await a call back from the pt to update him on this change.

## 2018-06-06 NOTE — Patient Instructions (Addendum)
Follow-up with our office in 3 months   Bronchiectasis: This is the medical term which indicates that you have damage, dilated airways making you more susceptible to respiratory infection. Use a flutter valve 10 breaths twice a day or 4 to 5 breaths 4-5 times a day to help clear mucus out Continue to use hypertonic saline nebs before he use her flutter valve this will help with mucus clearance Let us know if you have cough with change in mucus color or fevers or chills.  At that point you would need an antibiotic. Maintain a healthy nutritious diet, eating whole foods Take your medications as prescribed   Receive flu vaccine from primary care as planned   Continue Zyrtec daily  Only use your albuterol as a rescue medication to be used if you can't catch your breath by resting or doing a relaxed purse lip breathing pattern.  - The less you use it, the better it will work when you need it. - Ok to use up to 2 puffs  every 4 hours if you must but call for immediate appointment if use goes up over your usual need - Don't leave home without it !!  (think of it like the spare tire for your car)       It is flu season:   >>>Remember to be washing your hands regularly, using hand sanitizer, be careful to use around herself with has contact with people who are sick will increase her chances of getting sick yourself. >>> Best ways to protect herself from the flu: Receive the yearly flu vaccine, practice good hand hygiene washing with soap and also using hand sanitizer when available, eat a nutritious meals, get adequate rest, hydrate appropriately    As of 07/09/2018 we will be moving! We will no longer be at our [place] location.   Our new address and phone number will be:  [address] [place], [place] [address] Telephone number: [phone]   Please contact the office if your symptoms worsen or you have concerns that you are not improving.   Thank you for choosing [facility] for your healthcare, and for allowing [place] to partner with you on your healthcare journey. I am thankful to be able to provide care to you today.   Wyn Quaker FNP-C

## 2018-06-06 NOTE — Telephone Encounter (Signed)
-----   Message from Damian Leavell, RN sent at 06/06/2018  9:20 AM EDT ----- Per Dr. Corliss Parish to increase Xarelto to 20 mg if no bleeding on this dose.  GT  After reviewing chart, it appears we have changed him back and forth from 15 to 20 mg based on changing Cr.  OK to change Pt back to Xarelto 20 mg at this time per Dr. Lovena Le.  Sonia Baller   ----- Message ----- From: Brynda Peon, RN Sent: 06/04/2018  10:38 AM EDT To: Evans Lance, MD, Damian Leavell, RN  Pt last saw Dr Lovena Le 08/22/17, last labs at Dr Juel Burrow office Creat 1.21 on 02/26/18, age 84, weight 91.6kg, CrCl 55.73.  Based on CrCl pt is not on correct dosage of Xarelto 15mg  QD.  Will forward message to Dr Lovena Le to advise if dosage change to Xarelto 20mg  QD would be appropriate for this pt. Please advise.  Thanks

## 2018-06-06 NOTE — Telephone Encounter (Signed)
Please refer to note from Willeen Cass, RN regarding pt being able to switch to increase dose to Xarelto 20mg . Call has been place to the pt & we are awaiting a call back to update him.

## 2018-06-06 NOTE — Progress Notes (Addendum)
Subjective:   PATIENT ID: Robert [name]. GENDER: male DOB: [birth year], MRN: [id number]  Synopsis:Referred in November 2018 for Cough with Mucus production;  He smoked for years, 1 pack per day for 30 years until he had lung cancer that was resected in [place] in 4453.   84 year old male patient presenting today for follow-up visit.  Patient was previously seen with reports of shortness of breath.  Patient had hypertonic saline nebs added.  Patient reports he used hypertonic saline nebs for 1 week without flutter valve and did not notice an improvement.  Patient reports he does not use his flutter valve and over the last 6 to 8 weeks.  His wife is present today (former registered nurse-labor and delivery in pediatrics, long-term care) confirms he has not been using his flutter valve in quite a while.  Both patient and wife report they have not had any changes in their symptoms.  When inquired about the acute changes or shortness of breath that required last office visit patient reports that he has not had any acute changes in shortness of breath.  Patient denies fevers or chills.  Patient does report productive cough that is worse in the morning with yellow thick mucus.  Patient and wife both confirm this is his baseline.  Denies brown, green mucus or coughing up blood.  Patient was prescribed a rescue inhaler after last office visit.  Patient has not had to use this at all since last being seen.   Chief Complaint  Patient presents with  . Follow-up    reports feeling well currently, wants flu shot at PCP     Past Medical History:  Diagnosis Date  . Atrial fibrillation ([place])   . Hypothyroidism   . 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 Pneumonia 2015  . Symptomatic bradycardia    s/p PPM 1995      Review of Systems  Constitutional: Negative for fever and weight loss.  HENT: Negative for congestion, ear pain, nosebleeds and sore throat.   Eyes: Negative  for redness.  Respiratory: Positive for cough (Productive morning cough with thick yellow mucus, this is baseline for patient) and sputum production (Baseline for patient, yellow thick mucus worse in the morning). Negative for hemoptysis, shortness of breath and wheezing.   Cardiovascular: Negative for palpitations, leg swelling and PND.  Gastrointestinal: Negative for nausea and vomiting.  Genitourinary: Negative for dysuria.  Skin: Negative for rash.  Neurological: Negative for headaches.  Endo/Heme/Allergies: Does not bruise/bleed easily.  Psychiatric/Behavioral: Negative for depression. The patient is not nervous/anxious.       Objective:  Physical Exam  Constitutional: He is oriented to person, place, and time and well-developed, well-nourished, and in no distress. No distress.  HENT:  Head: Normocephalic and atraumatic.  Right Ear: Hearing, tympanic membrane, external ear and ear canal normal.  Left Ear: Hearing, tympanic membrane, external ear and ear canal normal.  Nose: Mucosal edema present. Right sinus exhibits no maxillary sinus tenderness and no frontal sinus tenderness. Left sinus exhibits no maxillary sinus tenderness and no frontal sinus tenderness.  Mouth/Throat: Uvula is midline and oropharynx is clear and moist. Abnormal dentition (crowns, filled cavities ). No oropharyngeal exudate.  Eyes: Pupils are equal, round, and reactive to light.  Neck: Normal range of motion. Neck supple. No JVD present.  +tenderness at Right neck base, chronic s/p surgery 29 years ago  Cardiovascular: Normal rate, regular rhythm and normal heart sounds.  Pulmonary/Chest:  Effort normal. No accessory muscle usage. No respiratory distress. He has no decreased breath sounds. He has no wheezes. He has no rhonchi.  +Coarse crackles right lower lobe  Abdominal: Soft. Bowel sounds are normal. There is no tenderness.  Musculoskeletal: Normal range of motion. He exhibits no edema.  Lymphadenopathy:     He has no cervical adenopathy.  Neurological: He is alert and oriented to person, place, and time. Gait normal.  Skin: Skin is warm and dry. He is not diaphoretic. No erythema.  Psychiatric: Mood, memory, affect and judgment normal.  Nursing note and vitals reviewed.    Vitals:   06/06/18 0913  BP: 136/68  Pulse: 85  SpO2: 98%  Weight: 197 lb (89.4 kg)  Height: 5' 10.67" (1.795 m)      CBC    Component Value Date/Time   WBC 6.3 11/23/2016 1326   RBC 3.86 (L) 11/23/2016 1326   HGB 13.4 11/23/2016 1326   HCT 38.6 11/23/2016 1326   PLT 140 (L) 11/23/2016 1326   MCV 100 (H) 11/23/2016 1326   MCH 34.7 (H) 11/23/2016 1326   MCHC 34.7 11/23/2016 1326   RDW 14.1 11/23/2016 1326   LYMPHSABS 1.3 11/23/2016 1326   EOSABS 0.2 11/23/2016 1326   BASOSABS 0.0 11/23/2016 1326     Chest imaging: 2015 chest x-ray images independently reviewed showing ICD, question emphysema vs basilar scarring, s/p RUL surgery 2018 high-resolution CT chest images independently reviewed showing borderline airway size in the bases bilaterally, patulous esophagus with air-fluid level, paraseptal emphysema and some mild centrilobular emphysema in the upper lobes, nonspecific intralobular fibrotic changes in a peripheral and basilar distribution, overall indeterminate for UIP  PFT: November 2018 spirometry test showed no airflow obstruction, FVC 3.19 L 78% predicted   Heart Catheterization:     Assessment & Plan:   Bronchiectasis without complication ([place])  Discussion: This is a pleasant 84 year old male patient presenting today for follow-up visit.  Unfortunately the patient has not been using his flutter valve for at least the last 2 months.  We have encouraged patient to resume use at least 2 times a day 10 breaths each time.  I also encouraged patient to resume hypertonic saline nebs as this will help with mucus clearance and healthy pulmonary hygiene.  Patient voices that he understands and he  will resume flutter valve use.  Patient's wife reports that she will also support him in this.  After discussion with Dr. Lake Bells the patient would like to decline having a repeat CT scan in December/2019 as previously ordered.  We emphasized the importance to the patient that they have a repeat scan based off of ILD seen on 2018 ct.  Patient declined at this time.  Patient will follow-up in 3 months and we can discuss following the bronchiectasis on a repeat CT after that appointment.    Current Outpatient Medications:  .  albuterol (PROVENTIL HFA;VENTOLIN HFA) 108 (90 Base) MCG/ACT inhaler, Inhale 2 puffs into the lungs every 4 (four) hours as needed for wheezing or shortness of breath., Disp: 1 Inhaler, Rfl: 5 .  cetirizine (ZYRTEC) 10 MG tablet, Take 10 mg by mouth daily., Disp: , Rfl:  .  desoximetasone (TOPICORT) 0.25 % cream, as directed. , Disp: , Rfl:  .  levothyroxine (SYNTHROID, LEVOTHROID) 100 MCG tablet, Take 100 mcg by mouth daily.  , Disp: , Rfl:  .  Multiple Vitamin (MULTIVITAMIN) tablet, Take 1 tablet by mouth daily.  , Disp: , Rfl:  .  Rivaroxaban (XARELTO)  15 MG TABS tablet, Take 1 tablet (15 mg total) by mouth daily with supper., Disp: 90 tablet, Rfl: 1 .  sodium chloride HYPERTONIC 3 % nebulizer solution, Take by nebulization 2 (two) times daily as needed for other., Disp: 750 mL, Rfl: 12 .  traMADol (ULTRAM) 50 MG tablet, Take 50 mg by mouth 3 (three) times daily as needed., Disp: , Rfl: 0 .  vitamin C (ASCORBIC ACID) 500 MG tablet, Take 500 mg by mouth daily., Disp: , Rfl:  .  albuterol (PROVENTIL) (2.5 MG/3ML) 0.083% nebulizer solution, Take 3 mLs (2.5 mg total) by nebulization every 6 (six) hours as needed for wheezing or shortness of breath. (Patient not taking: Reported on 06/06/2018), Disp: 75 mL, Rfl: 12  This appointment was 28 minutes long with over 50% of that time direct face-to-face patient care, assessment, plan of care follow-up, discussion of bronchiectasis,  discussion of following with CT scan in December/2019, discussion with Dr. Lake Bells, Dr. Lake Bells assessing patient.  Attending: I independently saw and examined the patient today  Mr. Donson did not use the flutter valve or the hypertonic saline as we directed. He says that his mucus production has not changed He says that he is frustrated over in certainty as to what supposed to happen with his pacemaker  On exam: Some crackles in the bases of his lungs Air movement is clear Cardiovascular regular rate and rhythm Psych, frustrated, angry  Bronchiectasis: I explained to him that we cannot cure this condition but I think the best way to manage the symptoms would be to take hypertonic saline twice a day and a flutter valve.  He sat there with his arms crossed and indicated to me in every way possible using nonverbal language that he did not want to do this.  I advised that we are just trying to help him manage his symptoms.  I am not sure if he is going to use the saline or not.  Interstitial lung disease: I explained today that I like to get another CT scan in December to make sure that what ever the scarring that was seen in 2018 has not progressed.  However, he did not want to do this either.  I am not sure if he is going to want to come back again.  I be very happy to see him again, though I think it would be best if he would be willing to participate in the care that I have recommended.

## 2018-06-07 ENCOUNTER — Telehealth: Payer: Self-pay | Admitting: Internal Medicine

## 2018-06-07 NOTE — Telephone Encounter (Signed)
New Message     *STAT* If patient is at the pharmacy, call can be transferred to refill team.   1. Which medications need to be refilled? (please list name of each medication and dose if known) Rivaroxaban (XARELTO) 15 MG TABS tablet  2. Which pharmacy/location (including street and city if local pharmacy) is medication to be sent to? [place] [address]  3. Do they need a 30 day or 90 day supply? 90 day

## 2018-06-08 MED ORDER — RIVAROXABAN 20 MG PO TABS: 20.0000 mg | ORAL_TABLET | Freq: Every day | ORAL | 1 refills | Status: DC

## 2018-06-08 NOTE — Telephone Encounter (Signed)
Spoke with the pt and instructed him that his Xarelto 15mg  dose is changing to Xarelto 20mg  per Dr. Lovena Le recommendations and he verbalized understanding and advised to send to [facility]. Rx sent today at this time.

## 2018-06-08 NOTE — Telephone Encounter (Signed)
Please note that pt's Xarelto's dose is being changed from 15mg  to 20mg  today, please refer to refill that has been sent today.

## 2018-07-02 ENCOUNTER — Ambulatory Visit (INDEPENDENT_AMBULATORY_CARE_PROVIDER_SITE_OTHER): Payer: BLUE CROSS/BLUE SHIELD | Admitting: *Deleted

## 2018-07-02 ENCOUNTER — Telehealth: Payer: Self-pay | Admitting: Cardiology

## 2018-07-02 DIAGNOSIS — I4891 Unspecified atrial fibrillation: Secondary | ICD-10-CM

## 2018-07-02 DIAGNOSIS — I495 Sick sinus syndrome: Secondary | ICD-10-CM

## 2018-07-02 NOTE — Progress Notes (Signed)
Remote pacemaker transmission.   

## 2018-07-02 NOTE — Telephone Encounter (Signed)
Late entry.  Transmission reviewed. No lead auto-testing since device tripped ERI. Known high RV threshold, Dr. Tanna Furry last office note mentions consideration of RV lead replacement during generator change. I advised that we would test the lead while he was in the office to discuss the procedure with Dr. Lovena Le. He verbalizes understanding.

## 2018-07-02 NOTE — Telephone Encounter (Signed)
Spoke w/ pt and informed that his remote transmission was received and his device has reached ERI / RRT. Informed pt that a scheduler will call him to schedule an appt w/ MD / NP / PA. Pt wanted to know how his leads were. Call routed to [place].

## 2018-07-04 ENCOUNTER — Telehealth: Payer: Self-pay | Admitting: Internal Medicine

## 2018-07-04 NOTE — Telephone Encounter (Signed)
New Message     1. Has your device fired? no  2. Is you device beeping? no  3. Are you experiencing draining or swelling at device site? no  4. Are you calling to see if we received your device transmission? no  5. Have you passed out? No  Patients wife is calling on his behalf. She states that they spoke with someone on 10/28 about the remote transmission and they were advised that they needed to come in and see Dr. Lovena Le to discuss changing out his battery but they have not heard anything. Please call to discuss.     Please route to [place]

## 2018-07-05 NOTE — Telephone Encounter (Signed)
Apt scheduled 07/10/18 10:30a w/ GT to discuss gen changed

## 2018-07-10 ENCOUNTER — Encounter: Payer: Self-pay | Admitting: *Deleted

## 2018-07-10 ENCOUNTER — Encounter: Payer: Self-pay | Admitting: Internal Medicine

## 2018-07-10 ENCOUNTER — Ambulatory Visit (INDEPENDENT_AMBULATORY_CARE_PROVIDER_SITE_OTHER): Payer: BLUE CROSS/BLUE SHIELD | Admitting: Internal Medicine

## 2018-07-10 VITALS — BP 122/66 | HR 69 | Ht 70.6 in | Wt 200.0 lb

## 2018-07-10 DIAGNOSIS — Z950 Presence of cardiac pacemaker: Secondary | ICD-10-CM | POA: Diagnosis not present

## 2018-07-10 DIAGNOSIS — I4891 Unspecified atrial fibrillation: Secondary | ICD-10-CM

## 2018-07-10 DIAGNOSIS — Z01812 Encounter for preprocedural laboratory examination: Secondary | ICD-10-CM | POA: Diagnosis not present

## 2018-07-10 LAB — CUP PACEART REMOTE DEVICE CHECK
Battery Remaining Longevity: 11 mo
Battery Voltage: 2.7 V
Brady Statistic RV Percent Paced: 96 %
Date Time Interrogation Session: 20190926133741
Implantable Lead Implant Date: 19950526
Implantable Lead Implant Date: 19950526
Implantable Lead Location: 753860
Implantable Lead Serial Number: 55140
Implantable Pulse Generator Implant Date: 20100715
Lead Channel Pacing Threshold Amplitude: 1.5 V
Lead Channel Setting Pacing Pulse Width: 0.4 ms
Lead Channel Setting Sensing Sensitivity: 2.8 mV
MDC IDC LEAD LOCATION: 753859
MDC IDC LEAD SERIAL: 55516
MDC IDC MSMT BATTERY IMPEDANCE: 4252 Ohm
MDC IDC MSMT LEADCHNL RA IMPEDANCE VALUE: 67 Ohm
MDC IDC MSMT LEADCHNL RV IMPEDANCE VALUE: 523 Ohm
MDC IDC MSMT LEADCHNL RV PACING THRESHOLD PULSEWIDTH: 0.4 ms
MDC IDC SET LEADCHNL RV PACING AMPLITUDE: 3.25 V

## 2018-07-10 LAB — CBC
HEMATOCRIT: 41 % (ref 37.5–51.0)
HEMOGLOBIN: 14 g/dL (ref 13.0–17.7)
MCH: 33 pg (ref 26.6–33.0)
MCHC: 34.1 g/dL (ref 31.5–35.7)
MCV: 97 fL (ref 79–97)
Platelets: 150 10*3/uL (ref 150–450)
RBC: 4.24 x10E6/uL (ref 4.14–5.80)
RDW: 12.5 % (ref 12.3–15.4)
WBC: 7.1 10*3/uL (ref 3.4–10.8)

## 2018-07-10 LAB — BASIC METABOLIC PANEL
BUN / CREAT RATIO: 15 (ref 10–24)
BUN: 19 mg/dL (ref 8–27)
CO2: 23 mmol/L (ref 20–29)
Calcium: 10.2 mg/dL (ref 8.6–10.2)
Chloride: 98 mmol/L (ref 96–106)
Creatinine, Ser: 1.23 mg/dL (ref 0.76–1.27)
GFR, EST AFRICAN AMERICAN: 61 mL/min/{1.73_m2} (ref >59)
GFR, EST NON AFRICAN AMERICAN: 52 mL/min/{1.73_m2} — AB (ref >59)
Glucose: 79 mg/dL (ref 65–99)
Potassium: 4.8 mmol/L (ref 3.5–5.2)
SODIUM: 138 mmol/L (ref 134–144)

## 2018-07-10 NOTE — Progress Notes (Signed)
HPI Robert [name] returns today for PPM followup in the setting of atrial fibrillation and CHB. He has been stable in the interim with no worsening of his symptoms of sob or chest pain. He has a h/o lung CA and is s/p resection remotely. He has not had syncope and his blood pressure has been well controlled. No edema.  No Known Allergies   Current Outpatient Medications  Medication Sig Dispense Refill  . albuterol (PROVENTIL HFA;VENTOLIN HFA) 108 (90 Base) MCG/ACT inhaler Inhale 2 puffs into the lungs every 4 (four) hours as needed for wheezing or shortness of breath. 1 Inhaler 5  . cetirizine (ZYRTEC) 10 MG tablet Take 10 mg by mouth daily.    Marland Kitchen desoximetasone (TOPICORT) 0.25 % cream as directed.     Marland Kitchen levothyroxine (SYNTHROID, LEVOTHROID) 100 MCG tablet Take 100 mcg by mouth daily.      . Multiple Vitamin (MULTIVITAMIN) tablet Take 1 tablet by mouth daily.      . rivaroxaban (XARELTO) 20 MG TABS tablet Take 1 tablet (20 mg total) by mouth daily with supper. 90 tablet 1  . sodium chloride HYPERTONIC 3 % nebulizer solution Take by nebulization 2 (two) times daily as needed for other. 750 mL 12  . traMADol (ULTRAM) 50 MG tablet Take 50 mg by mouth 3 (three) times daily as needed.  0  . vitamin C (ASCORBIC ACID) 500 MG tablet Take 500 mg by mouth daily.     No current facility-administered medications for this visit.      Past Medical History:  Diagnosis Date  . Atrial fibrillation ([place])   . Hypothyroidism   . 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[facility])    s/p XRT and surgical removal 1990  . Pneumonia 2015  . Symptomatic bradycardia    s/p PPM 1995    ROS:   All systems reviewed and negative except as noted in the HPI.   Past Surgical History:  Procedure Laterality Date  . CATARACT EXTRACTION, BILATERAL    . LUNG REMOVAL, PARTIAL    . PACEMAKER INSERTION  1995     Family History  Problem Relation Age of Onset  . Arrhythmia Father        Atrial fibrillation  . Diabetes Brother    Insulin dependent  . Breast cancer Sister   . Breast cancer Sister      Social History   Socioeconomic History  . Marital status: Married    Spouse name: Not on file  . Number of children: 2  . Years of education: Not on file  . Highest education level: Not on file  Occupational History  . Occupation: Retired  Scientific laboratory technician  . Financial resource strain: Not on file  . Food insecurity:    Worry: Not on file    Inability: Not on file  . Transportation needs:    Medical: Not on file    Non-medical: Not on file  Tobacco Use  . Smoking status: Former Smoker    Last attempt to quit: 09/05/1988    Years since quitting: 29.8  . Smokeless tobacco: Never Used  Substance and Sexual Activity  . Alcohol use: Yes    Alcohol/week: 7.0 standard drinks    Types: 7 Standard drinks or equivalent per week    Comment: 1 drink daily  . Drug use: No  . Sexual activity: Not on file  Lifestyle  . Physical activity:    Days per week: Not on file    Minutes per session:  Not on file  . Stress: Not on file  Relationships  . Social connections:    Talks on phone: Not on file    Gets together: Not on file    Attends religious service: Not on file    Active member of club or organization: Not on file    Attends meetings of clubs or organizations: Not on file    Relationship status: Not on file  . Intimate partner violence:    Fear of current or ex partner: Not on file    Emotionally abused: Not on file    Physically abused: Not on file    Forced sexual activity: Not on file  Other Topics Concern  . Not on file  Social History Narrative   Married     BP 122/66   Pulse 69   Ht 5' 10.6" (1.793 m)   Wt 200 lb (90.7 kg)   BMI 28.21 kg/m   Physical Exam:  Well appearing NAD HEENT: Unremarkable Neck:  No JVD, no thyromegally Lymphatics:  No adenopathy Back:  No CVA tenderness Lungs:  Clear with no wheezes HEART:  Regular rate rhythm, no murmurs, no rubs, no clicks Abd:  soft,  positive bowel sounds, no organomegally, no rebound, no guarding Ext:  2 plus pulses, no edema, no cyanosis, no clubbing Skin:  No rashes no nodules Neuro:  CN II through XII intact, motor grossly intact  EKG - atrial fib with intermittant venricular pacing  DEVICE  Normal device function.  See PaceArt for details. Elevated RV threshold.  Assess/Plan: 1. Atrial fib - his VR is controlled.  2. PPM - his ventricular threshold in the past has been up a bit. His programmed parameters have increased. At the time of generator change out we will evaluated the RV lead directly and place a new lead if his threshold is high or impedence is particularly low. 3. HTN - his blood pressure is well controlled today. We will follow.   Mikle Bosworth.D.

## 2018-07-10 NOTE — H&P (View-Only) (Signed)
HPI Robert [name] returns today for PPM followup in the setting of atrial fibrillation and CHB. He has been stable in the interim with no worsening of his symptoms of sob or chest pain. He has a h/o lung CA and is s/p resection remotely. He has not had syncope and his blood pressure has been well controlled. No edema.  No Known Allergies   Current Outpatient Medications  Medication Sig Dispense Refill  . albuterol (PROVENTIL HFA;VENTOLIN HFA) 108 (90 Base) MCG/ACT inhaler Inhale 2 puffs into the lungs every 4 (four) hours as needed for wheezing or shortness of breath. 1 Inhaler 5  . cetirizine (ZYRTEC) 10 MG tablet Take 10 mg by mouth daily.    Marland Kitchen desoximetasone (TOPICORT) 0.25 % cream as directed.     Marland Kitchen levothyroxine (SYNTHROID, LEVOTHROID) 100 MCG tablet Take 100 mcg by mouth daily.      . Multiple Vitamin (MULTIVITAMIN) tablet Take 1 tablet by mouth daily.      . rivaroxaban (XARELTO) 20 MG TABS tablet Take 1 tablet (20 mg total) by mouth daily with supper. 90 tablet 1  . sodium chloride HYPERTONIC 3 % nebulizer solution Take by nebulization 2 (two) times daily as needed for other. 750 mL 12  . traMADol (ULTRAM) 50 MG tablet Take 50 mg by mouth 3 (three) times daily as needed.  0  . vitamin C (ASCORBIC ACID) 500 MG tablet Take 500 mg by mouth daily.     No current facility-administered medications for this visit.      Past Medical History:  Diagnosis Date  . Atrial fibrillation ([place])   . Hypothyroidism   . 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[facility])    s/p XRT and surgical removal 1990  . Pneumonia 2015  . Symptomatic bradycardia    s/p PPM 1995    ROS:   All systems reviewed and negative except as noted in the HPI.   Past Surgical History:  Procedure Laterality Date  . CATARACT EXTRACTION, BILATERAL    . LUNG REMOVAL, PARTIAL    . PACEMAKER INSERTION  1995     Family History  Problem Relation Age of Onset  . Arrhythmia Father        Atrial fibrillation  . Diabetes Brother    Insulin dependent  . Breast cancer Sister   . Breast cancer Sister      Social History   Socioeconomic History  . Marital status: Married    Spouse name: Not on file  . Number of children: 2  . Years of education: Not on file  . Highest education level: Not on file  Occupational History  . Occupation: Retired  Scientific laboratory technician  . Financial resource strain: Not on file  . Food insecurity:    Worry: Not on file    Inability: Not on file  . Transportation needs:    Medical: Not on file    Non-medical: Not on file  Tobacco Use  . Smoking status: Former Smoker    Last attempt to quit: 09/05/1988    Years since quitting: 29.8  . Smokeless tobacco: Never Used  Substance and Sexual Activity  . Alcohol use: Yes    Alcohol/week: 7.0 standard drinks    Types: 7 Standard drinks or equivalent per week    Comment: 1 drink daily  . Drug use: No  . Sexual activity: Not on file  Lifestyle  . Physical activity:    Days per week: Not on file    Minutes per session:  Not on file  . Stress: Not on file  Relationships  . Social connections:    Talks on phone: Not on file    Gets together: Not on file    Attends religious service: Not on file    Active member of club or organization: Not on file    Attends meetings of clubs or organizations: Not on file    Relationship status: Not on file  . Intimate partner violence:    Fear of current or ex partner: Not on file    Emotionally abused: Not on file    Physically abused: Not on file    Forced sexual activity: Not on file  Other Topics Concern  . Not on file  Social History Narrative   Married     BP 122/66   Pulse 69   Ht 5' 10.6" (1.793 m)   Wt 200 lb (90.7 kg)   BMI 28.21 kg/m   Physical Exam:  Well appearing NAD HEENT: Unremarkable Neck:  No JVD, no thyromegally Lymphatics:  No adenopathy Back:  No CVA tenderness Lungs:  Clear with no wheezes HEART:  Regular rate rhythm, no murmurs, no rubs, no clicks Abd:  soft,  positive bowel sounds, no organomegally, no rebound, no guarding Ext:  2 plus pulses, no edema, no cyanosis, no clubbing Skin:  No rashes no nodules Neuro:  CN II through XII intact, motor grossly intact  EKG - atrial fib with intermittant venricular pacing  DEVICE  Normal device function.  See PaceArt for details. Elevated RV threshold.  Assess/Plan: 1. Atrial fib - his VR is controlled.  2. PPM - his ventricular threshold in the past has been up a bit. His programmed parameters have increased. At the time of generator change out we will evaluated the RV lead directly and place a new lead if his threshold is high or impedence is particularly low. 3. HTN - his blood pressure is well controlled today. We will follow.   Mikle Bosworth.D.

## 2018-07-10 NOTE — Patient Instructions (Signed)
Medication Instructions:  No change  Labwork: Today cbc, bmet  Testing/Procedures: Your physician has recommended that you have a pacemaker inserted. A pacemaker is a small device that is placed under the skin of your chest or abdomen to help control abnormal heart rhythms. This device uses electrical pulses to prompt the heart to beat at a normal rate. Pacemakers are used to treat heart rhythms that are too slow. Wire (leads) are attached to the pacemaker that goes into the chambers of you heart. This is done in the hospital and usually requires and overnight stay. Please see the instruction sheet given to you today for more information.  Any Other Special Instructions Will Be Listed Below (If Applicable).  Your physician recommends that you schedule a follow-up appointment in: 10-14 days in device clinic for wound check. Your physician recommends that you schedule a follow-up appointment with Dr. Lovena Le 91 days post procedure.   If you need a refill on your cardiac medications before your next appointment, please call your pharmacy.

## 2018-07-23 ENCOUNTER — Other Ambulatory Visit: Payer: Self-pay

## 2018-07-23 ENCOUNTER — Encounter (HOSPITAL_COMMUNITY)
Admission: RE | Disposition: A | Discharge status: Home / Self Care | Payer: Self-pay | Source: Ambulatory Visit | Attending: Internal Medicine

## 2018-07-23 ENCOUNTER — Ambulatory Visit (HOSPITAL_COMMUNITY)
Admission: RE | Admit: 2018-07-23 | Discharge: 2018-07-23 | Disposition: A | Discharge status: Home / Self Care | Payer: BLUE CROSS/BLUE SHIELD | Source: Ambulatory Visit | Attending: Internal Medicine | Admitting: Internal Medicine

## 2018-07-23 DIAGNOSIS — Z9842 Cataract extraction status, left eye: Secondary | ICD-10-CM | POA: Diagnosis not present

## 2018-07-23 DIAGNOSIS — Z79890 Hormone replacement therapy (postmenopausal): Secondary | ICD-10-CM | POA: Insufficient documentation

## 2018-07-23 DIAGNOSIS — Z79899 Other long term (current) drug therapy: Secondary | ICD-10-CM | POA: Insufficient documentation

## 2018-07-23 DIAGNOSIS — Z85118 Personal history of other malignant neoplasm of bronchus and lung: Secondary | ICD-10-CM | POA: Diagnosis not present

## 2018-07-23 DIAGNOSIS — Z9841 Cataract extraction status, right eye: Secondary | ICD-10-CM | POA: Insufficient documentation

## 2018-07-23 DIAGNOSIS — R001 Bradycardia, unspecified: Secondary | ICD-10-CM | POA: Diagnosis not present

## 2018-07-23 DIAGNOSIS — I482 Chronic atrial fibrillation, unspecified: Secondary | ICD-10-CM | POA: Diagnosis not present

## 2018-07-23 DIAGNOSIS — Z87891 Personal history of nicotine dependence: Secondary | ICD-10-CM | POA: Insufficient documentation

## 2018-07-23 DIAGNOSIS — E039 Hypothyroidism, unspecified: Secondary | ICD-10-CM | POA: Insufficient documentation

## 2018-07-23 DIAGNOSIS — Z902 Acquired absence of lung [part of]: Secondary | ICD-10-CM | POA: Diagnosis not present

## 2018-07-23 DIAGNOSIS — Z9889 Other specified postprocedural states: Secondary | ICD-10-CM | POA: Insufficient documentation

## 2018-07-23 DIAGNOSIS — I442 Atrioventricular block, complete: Secondary | ICD-10-CM | POA: Insufficient documentation

## 2018-07-23 DIAGNOSIS — Z950 Presence of cardiac pacemaker: Secondary | ICD-10-CM | POA: Diagnosis present

## 2018-07-23 DIAGNOSIS — Z45010 Encounter for checking and testing of cardiac pacemaker pulse generator [battery]: Secondary | ICD-10-CM

## 2018-07-23 DIAGNOSIS — I10 Essential (primary) hypertension: Secondary | ICD-10-CM | POA: Insufficient documentation

## 2018-07-23 DIAGNOSIS — Z803 Family history of malignant neoplasm of breast: Secondary | ICD-10-CM | POA: Insufficient documentation

## 2018-07-23 DIAGNOSIS — Z7901 Long term (current) use of anticoagulants: Secondary | ICD-10-CM | POA: Insufficient documentation

## 2018-07-23 DIAGNOSIS — Z8249 Family history of ischemic heart disease and other diseases of the circulatory system: Secondary | ICD-10-CM | POA: Diagnosis not present

## 2018-07-23 HISTORY — PX: PPM GENERATOR CHANGEOUT: EP1233

## 2018-07-23 LAB — SURGICAL PCR SCREEN
MRSA, PCR: NEGATIVE
Staphylococcus aureus: NEGATIVE

## 2018-07-23 SURGERY — PPM GENERATOR CHANGEOUT

## 2018-07-23 MED ORDER — MIDAZOLAM HCL 5 MG/5ML IJ SOLN
INTRAMUSCULAR | Status: DC | PRN
  Administered 2018-07-23 (×3): 1 mg via INTRAVENOUS

## 2018-07-23 MED ORDER — IOPAMIDOL (ISOVUE-370) INJECTION 76%
INTRAVENOUS | Status: AC
  Filled 2018-07-23: qty 50

## 2018-07-23 MED ORDER — CHLORHEXIDINE GLUCONATE 4 % EX LIQD
60.0000 mL | Freq: Once | CUTANEOUS | Status: DC
  Filled 2018-07-23: qty 60

## 2018-07-23 MED ORDER — ONDANSETRON HCL 4 MG/2ML IJ SOLN: 4.0000 mg | Freq: Four times a day (QID) | INTRAMUSCULAR | Status: DC | PRN

## 2018-07-23 MED ORDER — MUPIROCIN 2 % EX OINT
1.0000 "application " | TOPICAL_OINTMENT | Freq: Once | CUTANEOUS | Status: AC
  Administered 2018-07-23: 1 via TOPICAL
  Filled 2018-07-23: qty 22

## 2018-07-23 MED ORDER — HEPARIN (PORCINE) IN NACL 1000-0.9 UT/500ML-% IV SOLN
INTRAVENOUS | Status: DC | PRN
  Administered 2018-07-23: 500 mL

## 2018-07-23 MED ORDER — FENTANYL CITRATE (PF) 100 MCG/2ML IJ SOLN
INTRAMUSCULAR | Status: DC | PRN
  Administered 2018-07-23 (×3): 12.5 ug via INTRAVENOUS

## 2018-07-23 MED ORDER — FENTANYL CITRATE (PF) 100 MCG/2ML IJ SOLN
INTRAMUSCULAR | Status: AC
  Filled 2018-07-23: qty 2

## 2018-07-23 MED ORDER — SODIUM CHLORIDE 0.9 % IV SOLN
80.0000 mg | INTRAVENOUS | Status: AC
  Administered 2018-07-23: 80 mg

## 2018-07-23 MED ORDER — LIDOCAINE HCL (PF) 1 % IJ SOLN
INTRAMUSCULAR | Status: DC | PRN
  Administered 2018-07-23: 45 mL

## 2018-07-23 MED ORDER — CEFAZOLIN SODIUM-DEXTROSE 2-4 GM/100ML-% IV SOLN
2.0000 g | INTRAVENOUS | Status: AC
  Administered 2018-07-23: 2 g via INTRAVENOUS

## 2018-07-23 MED ORDER — LIDOCAINE HCL (PF) 1 % IJ SOLN
INTRAMUSCULAR | Status: AC
  Filled 2018-07-23: qty 30

## 2018-07-23 MED ORDER — MIDAZOLAM HCL 5 MG/5ML IJ SOLN
INTRAMUSCULAR | Status: AC
  Filled 2018-07-23: qty 5

## 2018-07-23 MED ORDER — ACETAMINOPHEN 325 MG PO TABS
325.0000 mg | ORAL_TABLET | ORAL | Status: DC | PRN
  Filled 2018-07-23: qty 2

## 2018-07-23 MED ORDER — SODIUM CHLORIDE 0.9 % IV SOLN
INTRAVENOUS | Status: AC
  Filled 2018-07-23: qty 2

## 2018-07-23 MED ORDER — CEFAZOLIN SODIUM-DEXTROSE 2-4 GM/100ML-% IV SOLN
INTRAVENOUS | Status: AC
  Filled 2018-07-23: qty 100

## 2018-07-23 MED ORDER — HEPARIN (PORCINE) IN NACL 1000-0.9 UT/500ML-% IV SOLN
INTRAVENOUS | Status: AC
  Filled 2018-07-23: qty 500

## 2018-07-23 MED ORDER — SODIUM CHLORIDE 0.9 % IV SOLN
INTRAVENOUS | Status: DC
  Administered 2018-07-23: 10:00:00 via INTRAVENOUS

## 2018-07-23 SURGICAL SUPPLY — 8 items
CABLE SURGICAL S-101-97-12 (CABLE) ×3 IMPLANT
IPG PACE AZUR XT SR MRI W1SR01 (Pacemaker) ×1 IMPLANT
KIT LEAD END CAP (Cap) ×1 IMPLANT
LEAD END CAP (Cap) ×2 IMPLANT
PACE AZURE XT SR MRI W1SR01 (Pacemaker) ×3 IMPLANT
PAD PRO RADIOLUCENT 2001M-C (PAD) ×3 IMPLANT
SHEATH CLASSIC 7F (SHEATH) ×3 IMPLANT
TRAY PACEMAKER INSERTION (PACKS) ×3 IMPLANT

## 2018-07-23 NOTE — Discharge Instructions (Signed)
Pacemaker Battery Change, Care After This sheet gives you information about how to care for yourself after your procedure. Your health care provider may also give you more specific instructions. If you have problems or questions, contact your health care provider. What can I expect after the procedure? After your procedure, it is common to have:  Pain or soreness at the site where the pacemaker was inserted.  Swelling at the site where the pacemaker was inserted.  Follow these instructions at home: Incision care  Keep the incision clean and dry. ? Do not take baths, swim, or use a hot tub until fully healed. ? You may shower the day after your procedure, or as directed by your health care provider. ? Pat the area dry with a clean towel. Do not rub the area. This may cause bleeding.  Follow instructions from your health care provider about how to take care of your incision. Make sure you: ? Wash your hands with soap and water before you change your bandage (dressing). If soap and water are not available, use hand sanitizer. ? Change your dressing as told by your health care provider. ? Leave stitches (sutures), skin glue, or adhesive strips in place. These skin closures may need to stay in place for 2 weeks or longer. If adhesive strip edges start to loosen and curl up, you may trim the loose edges. Do not remove adhesive strips completely unless your health care provider tells you to do that.  Check your incision area every day for signs of infection. Check for: ? More redness, swelling, or pain. ? More fluid or blood. ? Warmth. ? Pus or a bad smell. Activity  Do not lift anything that is heavier than 10 lb (4.5 kg) until your health care provider says it is okay to do so.  For the first 2 weeks, or as long as told by your health care provider: ? Avoid lifting your left arm higher than your shoulder. ? Be gentle when you move your arms over your head. It is okay to raise your arm to  comb your hair. ? Avoid strenuous exercise.  Ask your health care provider when it is okay to: ? Resume your normal activities. ? Return to work or school. ? Resume sexual activity. Eating and drinking  Eat a heart-healthy diet. This should include plenty of fresh fruits and vegetables, whole grains, low-fat dairy products, and lean protein like chicken and fish.  Limit alcohol intake to no more than 1 drink a day for non-pregnant women and 2 drinks a day for men. One drink equals 12 oz of beer, 5 oz of wine, or 1 oz of hard liquor.  Check ingredients and nutrition facts on packaged foods and beverages. Avoid the following types of food: ? Food that is high in salt (sodium). ? Food that is high in saturated fat, like full-fat dairy or red meat. ? Food that is high in trans fat, like fried food. ? Food and drinks that are high in sugar. Lifestyle  Do not use any products that contain nicotine or tobacco, such as cigarettes and e-cigarettes. If you need help quitting, ask your health care provider.  Take steps to manage and control your weight.  Get regular exercise. Aim for 150 minutes of moderate-intensity exercise (such as walking or yoga) or 75 minutes of vigorous exercise (such as running or swimming) each week.  Manage other health problems, such as diabetes or high blood pressure. Ask your health care provider how  you can manage these conditions. General instructions  Do not drive for 24 hours after your procedure if you were given a medicine to help you relax (sedative).  Take over-the-counter and prescription medicines only as told by your health care provider.  Avoid putting pressure on the area where the pacemaker was placed.  If you need an MRI after your pacemaker has been placed, be sure to tell the health care provider who orders the MRI that you have a pacemaker.  Avoid close and prolonged exposure to electrical devices that have strong magnetic fields. These  include: ? Cell phones. Avoid keeping them in a pocket near the pacemaker, and try using the ear opposite the pacemaker. ? MP3 players. ? Household appliances, like microwaves. ? Metal detectors. ? Electric generators. ? High-tension wires.  Keep all follow-up visits as directed by your health care provider. This is important. Contact a health care provider if:  You have pain at the incision site that is not relieved by over-the-counter or prescription medicines.  You have any of these around your incision site or coming from it: ? More redness, swelling, or pain. ? Fluid or blood. ? Warmth to the touch. ? Pus or a bad smell.  You have a fever.  You feel brief, occasional palpitations, light-headedness, or any symptoms that you think might be related to your heart. Get help right away if:  You experience chest pain that is different from the pain at the pacemaker site.  You develop a red streak that extends above or below the incision site.  You experience shortness of breath.  You have palpitations or an irregular heartbeat.  You have light-headedness that does not go away quickly.  You faint or have dizzy spells.  Your pulse suddenly drops or increases rapidly and does not return to normal.  You begin to gain weight and your legs and ankles swell. Summary  After your procedure, it is common to have pain, soreness, and some swelling where the pacemaker was inserted.  Make sure to keep your incision clean and dry. Follow instructions from your health care provider about how to take care of your incision.  Check your incision every day for signs of infection, such as more pain or swelling, pus or a bad smell, warmth, or leaking fluid and blood.  Avoid strenuous exercise and lifting your left arm higher than your shoulder for 2 weeks, or as long as told by your health care provider. This information is not intended to replace advice given to you by your health care  provider. Make sure you discuss any questions you have with your health care provider. Document Released: 06/12/2013 Document Revised: 07/14/2016 Document Reviewed: 07/14/2016 Elsevier Interactive Patient Education  2017 [employer].

## 2018-07-23 NOTE — Interval H&P Note (Signed)
History and Physical Interval Note:  07/23/2018 12:17 PM  Robert [name].  has presented today for surgery, with the diagnosis of rrt  The various methods of treatment have been discussed with the patient and family. After consideration of risks, benefits and other options for treatment, the patient has consented to  Procedure(s): PPM GENERATOR CHANGEOUT (N/A) as a surgical intervention .  The patient's history has been reviewed, patient examined, no change in status, stable for surgery.  I have reviewed the patient's chart and labs.  Questions were answered to the patient's satisfaction.     [name]

## 2018-07-24 ENCOUNTER — Encounter (HOSPITAL_COMMUNITY): Payer: Self-pay | Admitting: Internal Medicine

## 2018-07-24 ENCOUNTER — Other Ambulatory Visit: Payer: Self-pay

## 2018-07-24 DIAGNOSIS — R911 Solitary pulmonary nodule: Secondary | ICD-10-CM

## 2018-08-06 ENCOUNTER — Ambulatory Visit (INDEPENDENT_AMBULATORY_CARE_PROVIDER_SITE_OTHER): Payer: BLUE CROSS/BLUE SHIELD

## 2018-08-06 ENCOUNTER — Ambulatory Visit (INDEPENDENT_AMBULATORY_CARE_PROVIDER_SITE_OTHER): Payer: BLUE CROSS/BLUE SHIELD | Admitting: *Deleted

## 2018-08-06 DIAGNOSIS — I4821 Permanent atrial fibrillation: Secondary | ICD-10-CM | POA: Diagnosis not present

## 2018-08-06 DIAGNOSIS — Z950 Presence of cardiac pacemaker: Secondary | ICD-10-CM

## 2018-08-06 LAB — CUP PACEART INCLINIC DEVICE CHECK
Implantable Lead Implant Date: 19950526
Implantable Lead Location: 753859
Implantable Lead Serial Number: 55516
Implantable Pulse Generator Implant Date: 20191118
Lead Channel Impedance Value: 380 Ohm
Lead Channel Pacing Threshold Amplitude: 1.75 V
Lead Channel Sensing Intrinsic Amplitude: 7.6 mV
Lead Channel Setting Pacing Amplitude: 3 V
Lead Channel Setting Pacing Pulse Width: 1 ms
Lead Channel Setting Sensing Sensitivity: 1.2 mV
MDC IDC LEAD IMPLANT DT: 19950526
MDC IDC LEAD LOCATION: 753860
MDC IDC LEAD SERIAL: 55140
MDC IDC MSMT BATTERY REMAINING LONGEVITY: 97 mo
MDC IDC MSMT LEADCHNL RV PACING THRESHOLD PULSEWIDTH: 1 ms
MDC IDC SESS DTM: 20191202122039
MDC IDC STAT BRADY RV PERCENT PACED: 87.1 %

## 2018-08-06 NOTE — Progress Notes (Signed)
Wound check appointment s/p gen change. Steri-strips removed this morning by patient and spouse. Wound without redness or edema. Incision edges approximated, wound well healed. Normal device function. Threshold, sensing, and impedance consistent with implant measurements. Device programmed at appropriate safety margins. Histogram distribution appropriate for patient and level of activity. No high ventricular rates noted. Patient educated about wound care, arm mobility, and remote monitoring. ROV in 3 months with GT.

## 2018-08-06 NOTE — Progress Notes (Signed)
Remote pacemaker transmission.   

## 2018-08-09 ENCOUNTER — Encounter: Payer: Self-pay | Admitting: Cardiology

## 2018-08-23 ENCOUNTER — Ambulatory Visit (HOSPITAL_COMMUNITY)
Admission: RE | Admit: 2018-08-23 | Discharge: 2018-08-23 | Disposition: A | Discharge status: Home / Self Care | Payer: BLUE CROSS/BLUE SHIELD | Source: Ambulatory Visit | Attending: Pulmonary Disease | Admitting: Pulmonary Disease

## 2018-08-23 ENCOUNTER — Other Ambulatory Visit: Payer: BLUE CROSS/BLUE SHIELD

## 2018-08-23 DIAGNOSIS — Z9289 Personal history of other medical treatment: Secondary | ICD-10-CM

## 2018-08-23 DIAGNOSIS — R911 Solitary pulmonary nodule: Secondary | ICD-10-CM | POA: Diagnosis present

## 2018-08-23 HISTORY — DX: Personal history of other medical treatment: Z92.89

## 2018-08-24 ENCOUNTER — Telehealth: Payer: Self-pay | Admitting: Pulmonary Disease

## 2018-08-24 NOTE — Telephone Encounter (Signed)
Call made to patient, made aware of CT results. Voiced understanding. Nothing further is needed at this time.

## 2018-09-03 ENCOUNTER — Encounter: Payer: BLUE CROSS/BLUE SHIELD | Admitting: Internal Medicine

## 2018-09-11 ENCOUNTER — Ambulatory Visit (INDEPENDENT_AMBULATORY_CARE_PROVIDER_SITE_OTHER): Payer: BLUE CROSS/BLUE SHIELD | Admitting: Pulmonary Disease

## 2018-09-11 ENCOUNTER — Encounter: Payer: Self-pay | Admitting: Pulmonary Disease

## 2018-09-11 VITALS — BP 122/74 | HR 84 | Ht 70.5 in | Wt 200.0 lb

## 2018-09-11 DIAGNOSIS — J432 Centrilobular emphysema: Secondary | ICD-10-CM | POA: Diagnosis not present

## 2018-09-11 DIAGNOSIS — J479 Bronchiectasis, uncomplicated: Secondary | ICD-10-CM | POA: Diagnosis not present

## 2018-09-11 DIAGNOSIS — R911 Solitary pulmonary nodule: Secondary | ICD-10-CM | POA: Diagnosis not present

## 2018-09-11 NOTE — Progress Notes (Signed)
Subjective:   PATIENT ID: Robert [name]. GENDER: male DOB: 02/18/1929, MRN: [id number]  Synopsis:Bronchiectasis: referred in November 2018 for Cough with Mucus production;  He smoked for years, 1 pack per day for 30 years until he had lung cancer that was rese[place] in [place] in [place].   HPI  Chief Complaint  Patient presents with  . Follow-up    pt states he is doing well, cough has improved at night, does still have some sob with exertion.    [name] says that he has been doing "not too bad".  He doesn't feel like the dyspnea and fatigue have not worsened.  He had some dyspnea in [place] while walking, but none otherwise.  No bronchitis or pneumonia.  He had a new pacemaker placed on November 17.  He had a lead removed but the primary lead was left, it has been there 24 years.    He continues to have mucus production in the mornings.  He says he has not been using his flutter valve and hypertonic saline.    Past Medical History:  Diagnosis Date  . Atrial fibrillation ([place])   . Hypothyroidism   . 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 Pneumonia 2015  . Symptomatic bradycardia    s/p PPM 1995      Review of Systems  Constitutional: Negative for fever and weight loss.  HENT: Negative for congestion, ear pain, nosebleeds and sore throat.   Eyes: Negative for redness.  Respiratory: Negative for cough, shortness of breath and wheezing.   Cardiovascular: Negative for palpitations, leg swelling and PND.  Gastrointestinal: Negative for nausea and vomiting.  Genitourinary: Negative for dysuria.  Skin: Negative for rash.  Neurological: Negative for headaches.  Endo/Heme/Allergies: Does not bruise/bleed easily.  Psychiatric/Behavioral: Negative for depression. The patient is not nervous/anxious.       Objective:  Physical Exam   Vitals:   09/11/18 0923  BP: 122/74  Pulse: 84  SpO2: 98%  Weight: 200 lb (90.7 kg)  Height: 5' 10.5" (1.791 m)     Gen: well appearing HENT: OP clear, neck supple PULM: Crackles R base B, normal percussion CV: RRR, no mgr, trace edema GI: BS+, soft, nontender Derm: no cyanosis or rash Psyche: normal mood and affect    CBC    Component Value Date/Time   WBC 7.1 07/10/2018 1135   RBC 4.24 07/10/2018 1135   HGB 14.0 07/10/2018 1135   HCT 41.0 07/10/2018 1135   PLT 150 07/10/2018 1135   MCV 97 07/10/2018 1135   MCH 33.0 07/10/2018 1135   MCHC 34.1 07/10/2018 1135   RDW 12.5 07/10/2018 1135   LYMPHSABS 1.3 11/23/2016 1326   EOSABS 0.2 11/23/2016 1326   BASOSABS 0.0 11/23/2016 1326     Chest imaging: 2015 chest x-ray images independently reviewed showing ICD, question emphysema vs basilar scarring, s/p RUL surgery 2018 high-resolution CT chest images independently reviewed showing borderline airway size in the bases bilaterally, patulous esophagus with air-fluid level, paraseptal emphysema and some mild centrilobular emphysema in the upper lobes, nonspecific intralobular fibrotic changes in a peripheral and basilar distribution, overall indeterminate for UIP 2019 high-resolution CT scan of the chest showed persistent fibrotic changes in the right lower lobe most predominantly with mild cylindrical bronchiectasis, this is felt to be related to mild chronic postinfectious fibrosis, there is a 4 mm nodule in the right lower lobe and right middle lobe which have not changed, paraseptal and  centrilobular emphysema noted, aortic atherosclerosis noted, cardiomegaly with right atrial dilation noted, images independently reviewed, the fibrotic change are minimal  PFT: November 2018 spirometry test showed no airflow obstruction, FVC 3.19 L 78% predicted  Labs:  Path:  Echo:  Heart Catheterization:  Micro:        Assessment & Plan:   Solitary pulmonary nodule  Bronchiectasis without complication (HCC)  Centrilobular emphysema ([name])  Discussion: [name] says that he has been  doing well since the last visit.  No exacerbations.  He is not using his routine maintenance for bronchiectasis but I think that is okay since he has not been having symptoms and he has not had bronchiectasis exacerbations.  I explained to him that if he does have increased symptoms then he needs to use the flutter valve regularly and hypertonic saline regularly.  His CT scan did not show evidence of new or worsening fibrosis so I do not think we need more scans.  The small nodules in the right lower lobe and right middle lobe do not have features of malignancy.  Plan: Pulmonary nodules: At this time we will not plan on further imaging of these  Bronchiectasis: This is the medical term that means that your airways are bigger and more dilated than normal If you have increasing chest congestion or mucus production use the hypertonic saline nebulized twice a day, use the flutter valve 4 to 5 breaths, 4-5 times a day If you have bronchitis I need to know about it Practice good hand hygiene Stay active  Emphysema: Use albuterol as needed  We will see you back in 6 months or sooner if needed      Current Outpatient Medications:  .  albuterol (PROVENTIL HFA;VENTOLIN HFA) 108 (90 Base) MCG/ACT inhaler, Inhale 2 puffs into the lungs every 4 (four) hours as needed for wheezing or shortness of breath., Disp: 1 Inhaler, Rfl: 5 .  cetirizine (ZYRTEC) 10 MG tablet, Take 10 mg by mouth at bedtime. , Disp: , Rfl:  .  desoximetasone (TOPICORT) 0.25 % cream, Apply 1 application topically daily as needed (rash). , Disp: , Rfl:  .  fluticasone (CUTIVATE) 0.005 % ointment, Apply 1 application topically daily as needed (ear rash)., Disp: , Rfl:  .  levothyroxine (SYNTHROID, LEVOTHROID) 100 MCG tablet, Take 100 mcg by mouth daily before breakfast. , Disp: , Rfl:  .  Multiple Vitamin (MULTIVITAMIN) tablet, Take 1 tablet by mouth daily.  , Disp: , Rfl:  .  rivaroxaban (XARELTO) 20 MG TABS tablet, Take 1 tablet  (20 mg total) by mouth daily with supper., Disp: 90 tablet, Rfl: 1 .  sodium chloride HYPERTONIC 3 % nebulizer solution, Take by nebulization 2 (two) times daily as needed for other., Disp: 750 mL, Rfl: 12 .  traMADol (ULTRAM) 50 MG tablet, Take 50 mg by mouth 3 (three) times daily as needed for severe pain. , Disp: , Rfl: 0 .  vitamin C (ASCORBIC ACID) 500 MG tablet, Take 500 mg by mouth daily., Disp: , Rfl:

## 2018-09-11 NOTE — Patient Instructions (Signed)
Pulmonary nodules: At this time we will not plan on further imaging of these  Bronchiectasis: This is the medical term that means that your airways are bigger and more dilated than normal If you have increasing chest congestion or mucus production use the hypertonic saline nebulized twice a day, use the flutter valve 4 to 5 breaths, 4-5 times a day If you have bronchitis I need to know about it Practice good hand hygiene Stay active  Emphysema: Use albuterol as needed  We will see you back in 6 months or sooner if needed

## 2018-09-18 ENCOUNTER — Telehealth: Payer: Self-pay | Admitting: Cardiology

## 2018-09-18 NOTE — Telephone Encounter (Signed)
-----   Message from [name], NP sent at 09/18/2018  5:44 AM EST ----- Can ya'll call him please and ask him to stop sending manual transmissions every day?  Thank you! Museum/gallery conservator

## 2018-09-18 NOTE — Telephone Encounter (Signed)
Spoke w/ pt and informed him to stop sending manual transmission every day. Pt verbalized understanding.

## 2018-10-17 ENCOUNTER — Encounter: Payer: Self-pay | Admitting: Internal Medicine

## 2018-10-23 ENCOUNTER — Other Ambulatory Visit: Payer: Self-pay | Admitting: Chiropractic Medicine

## 2018-10-23 DIAGNOSIS — M5416 Radiculopathy, lumbar region: Secondary | ICD-10-CM

## 2018-10-24 ENCOUNTER — Ambulatory Visit
Admission: RE | Admit: 2018-10-24 | Discharge: 2018-10-24 | Disposition: A | Discharge status: Home / Self Care | Payer: BLUE CROSS/BLUE SHIELD | Source: Ambulatory Visit | Attending: Chiropractic Medicine | Admitting: Chiropractic Medicine

## 2018-10-24 DIAGNOSIS — R937 Abnormal findings on diagnostic imaging of other parts of musculoskeletal system: Secondary | ICD-10-CM

## 2018-10-24 DIAGNOSIS — M5416 Radiculopathy, lumbar region: Secondary | ICD-10-CM

## 2018-10-24 HISTORY — DX: Abnormal findings on diagnostic imaging of other parts of musculoskeletal system: R93.7

## 2018-11-01 ENCOUNTER — Ambulatory Visit: Payer: BLUE CROSS/BLUE SHIELD | Admitting: Internal Medicine

## 2018-11-01 ENCOUNTER — Encounter: Payer: Self-pay | Admitting: Internal Medicine

## 2018-11-01 VITALS — BP 122/74 | HR 77 | Ht 72.0 in | Wt 210.8 lb

## 2018-11-01 DIAGNOSIS — Z950 Presence of cardiac pacemaker: Secondary | ICD-10-CM

## 2018-11-01 DIAGNOSIS — I4821 Permanent atrial fibrillation: Secondary | ICD-10-CM | POA: Diagnosis not present

## 2018-11-01 DIAGNOSIS — I495 Sick sinus syndrome: Secondary | ICD-10-CM

## 2018-11-01 NOTE — Progress Notes (Signed)
HPI Mr. Bamber returns today after undergoing PPM gen change 3 months ago. He is an 85 yo man with chronic atrial fib, remote sinus node dysfunction, high grade AV block, s/p PPM insertion. He has had some degradation in his RV lead function. He denies chest pain or sob. He is a little anxious about his RV lead which has had an increase in his threshold. He has been bothered by edema. No Known Allergies   Current Outpatient Medications  Medication Sig Dispense Refill  . albuterol (PROVENTIL HFA;VENTOLIN HFA) 108 (90 Base) MCG/ACT inhaler Inhale 2 puffs into the lungs every 4 (four) hours as needed for wheezing or shortness of breath. 1 Inhaler 5  . cetirizine (ZYRTEC) 10 MG tablet Take 10 mg by mouth at bedtime.     Marland Kitchen desoximetasone (TOPICORT) 0.25 % cream Apply 1 application topically daily as needed (rash).     . fluticasone (CUTIVATE) 0.005 % ointment Apply 1 application topically daily as needed (ear rash).    Marland Kitchen levothyroxine (SYNTHROID, LEVOTHROID) 100 MCG tablet Take 100 mcg by mouth daily before breakfast.     . Multiple Vitamin (MULTIVITAMIN) tablet Take 1 tablet by mouth daily.      . rivaroxaban (XARELTO) 20 MG TABS tablet Take 1 tablet (20 mg total) by mouth daily with supper. 90 tablet 1  . sodium chloride HYPERTONIC 3 % nebulizer solution Take by nebulization 2 (two) times daily as needed for other. 750 mL 12  . traMADol (ULTRAM) 50 MG tablet Take 50 mg by mouth 3 (three) times daily as needed for severe pain.   0  . vitamin C (ASCORBIC ACID) 500 MG tablet Take 500 mg by mouth daily.     No current facility-administered medications for this visit.      Past Medical History:  Diagnosis Date  . Atrial fibrillation ([place])   . Hypothyroidism   . 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[facility])    s/p XRT and surgical removal 1990  . Pneumonia 2015  . Symptomatic bradycardia    s/p PPM 1995    ROS:   All systems reviewed and negative except as noted in the HPI.   Past Surgical History:    Procedure Laterality Date  . CATARACT EXTRACTION, BILATERAL    . LUNG REMOVAL, PARTIAL    . PACEMAKER INSERTION  1995  . PPM GENERATOR CHANGEOUT N/A 07/23/2018   Procedure: PPM GENERATOR CHANGEOUT;  Surgeon: Evans Lance, MD;  Location: [facility];  Service: Cardiovascular;  Laterality: N/A;     Family History  Problem Relation Age of Onset  . Arrhythmia Father        Atrial fibrillation  . Diabetes Brother        Insulin dependent  . Breast cancer Sister   . Breast cancer Sister      Social History   Socioeconomic History  . Marital status: Married    Spouse name: Not on file  . Number of children: 2  . Years of education: Not on file  . Highest education level: Not on file  Occupational History  . Occupation: Retired  Scientific laboratory technician  . Financial resource strain: Not on file  . Food insecurity:    Worry: Not on file    Inability: Not on file  . Transportation needs:    Medical: Not on file    Non-medical: Not on file  Tobacco Use  . Smoking status: Former Smoker    Last attempt to quit: 09/05/1988  Years since quitting: 30.1  . Smokeless tobacco: Never Used  Substance and Sexual Activity  . Alcohol use: Yes    Alcohol/week: 7.0 standard drinks    Types: 7 Standard drinks or equivalent per week    Comment: 1 drink daily  . Drug use: No  . Sexual activity: Not on file  Lifestyle  . Physical activity:    Days per week: Not on file    Minutes per session: Not on file  . Stress: Not on file  Relationships  . Social connections:    Talks on phone: Not on file    Gets together: Not on file    Attends religious service: Not on file    Active member of club or organization: Not on file    Attends meetings of clubs or organizations: Not on file    Relationship status: Not on file  . Intimate partner violence:    Fear of current or ex partner: Not on file    Emotionally abused: Not on file    Physically abused: Not on file    Forced sexual activity:  Not on file  Other Topics Concern  . Not on file  Social History Narrative   Married     BP 122/74   Pulse 77   Ht 6' (1.829 m)   Wt 210 lb 12.8 oz (95.6 kg)   SpO2 99%   BMI 28.59 kg/m   Physical Exam:  Well appearing elderly man, NAD HEENT: Unremarkable Neck:  No JVD, no thyromegally Lymphatics:  No adenopathy Back:  No CVA tenderness Lungs:  Clear with no wheezes HEART:  Regular rate rhythm, no murmurs, no rubs, no clicks Abd:  soft, positive bowel sounds, no organomegally, no rebound, no guarding Ext:  2 plus pulses, no edema, no cyanosis, no clubbing Skin:  No rashes no nodules Neuro:  CN II through XII intact, motor grossly intact  EKG - atrial fib with intermittent ventricular pacing  DEVICE  Normal device function.  See PaceArt for details.   Assess/Plan: 1. Atrial fib - his ventricular rate is well controlled. No change in meds. 2. PPM - his medtronic single chamber PPM has been reprogrammed to improve battery longevity. 3. HTN - his blood pressure is well controlled today.   Mikle Bosworth.D.

## 2018-11-01 NOTE — Patient Instructions (Signed)
Medication Instructions:  Your physician recommends that you continue on your current medications as directed. Please refer to the Current Medication list given to you today.  Labwork: None ordered.  Testing/Procedures: None ordered.  Follow-Up: Your physician wants you to follow-up in: 9 months with Dr. Lovena Le.   You will receive a reminder letter in the mail two months in advance. If you don't receive a letter, please call our office to schedule the follow-up appointment.  Remote monitoring is used to monitor your Pacemaker from home. This monitoring reduces the number of office visits required to check your device to one time per year. It allows [place] to keep an eye on the functioning of your device to ensure it is working properly. You are scheduled for a device check from home on 01/31/2019. You may send your transmission at any time that day. If you have a wireless device, the transmission will be sent automatically. After your physician reviews your transmission, you will receive a postcard with your next transmission date.  Any Other Special Instructions Will Be Listed Below (If Applicable).  If you need a refill on your cardiac medications before your next appointment, please call your pharmacy.

## 2018-11-02 LAB — CUP PACEART INCLINIC DEVICE CHECK
Implantable Lead Implant Date: 19950526
Implantable Lead Location: 753859
Implantable Lead Location: 753860
Implantable Lead Serial Number: 55140
Implantable Lead Serial Number: 55516
MDC IDC LEAD IMPLANT DT: 19950526
MDC IDC PG IMPLANT DT: 20191118
MDC IDC SESS DTM: 20200228102435

## 2018-11-08 ENCOUNTER — Telehealth: Payer: Self-pay | Admitting: *Deleted

## 2018-11-08 NOTE — Telephone Encounter (Signed)
   Primary Cardiologist: [name], MD  Chart reviewed as part of pre-operative protocol coverage. Given past medical history and time since last visit, based on ACC/AHA guidelines, Dev Dhondt. would be at acceptable risk for the planned procedure without further cardiovascular testing.   Per office protocol, patient can hold Xarelto for 3 days prior to procedure.    I will route this recommendation to the requesting party via Epic fax function and remove from pre-op pool.  Please call with questions.  Lyda Jester, PA-C 11/08/2018, 12:36 PM

## 2018-11-08 NOTE — Telephone Encounter (Signed)
   [facility] Pre-operative Risk Assessment    Request for surgical clearance:  1. What type of surgery is being performed? LUMBAR SNRB INJECTION   2. When is this surgery scheduled? 11/22/18   3. What type of clearance is required (medical clearance vs. Pharmacy clearance to hold med vs. Both)? BOTH  4. Are there any medications that need to be held prior to surgery and how long?XARELTO 3 DAYS PRIOR   5. Practice name and name of physician performing surgery? [facility]; Levy Pupa, PA   6. What is your office phone number [phone]    7.   What is your office fax number [phone]  8.   Anesthesia type (None, local, MAC, general) ? SELECTIVE NERVE BLOCK LUMBAR   Julaine Hua 11/08/2018, 11:59 AM  _________________________________________________________________   (provider comments below)

## 2018-11-08 NOTE — Telephone Encounter (Signed)
Patient with diagnosis of afib on Xarelto for anticoagulation.    Procedure: LUMBAR SNRB INJECTION  Date of procedure: 11/22/2018  CHADS2-VASc score of  2 (CHF, HTN, AGE, DM2, stroke/tia x 2, CAD, AGE, male)  CrCl 11ml/min  Per office protocol, patient can hold Xarelto for 3 days prior to procedure.

## 2018-11-12 ENCOUNTER — Telehealth: Payer: Self-pay | Admitting: Cardiology

## 2018-11-12 NOTE — Telephone Encounter (Signed)
It is ok to hold Xarelto for 3 days. Restart Xarelto when injecting MD says it is ok to do so. Ultra low sodium diet. Call us back next week if his leg swelling is not improved.  GT

## 2018-11-12 NOTE — Telephone Encounter (Signed)
Patient wife called back and stated that patients legs are still very swollen. She stated that when patient seen Dr. Lovena Le on 11/01/2018 they discussed starting new medications. She would like more information on this and possibly a prescription sent to their pharmacy. Patient is also having a spinal injection done on 11-22-2018. It is not an epidural. She was told by the MD office who is doing the injection that patient will stop Xarleto 3 days prior to the injection. She wants to know if this is the right recommendation. Please call her back.

## 2018-11-13 NOTE — Telephone Encounter (Signed)
Spoke with pts wife, Romie Minus (DPR on file) and advised her of Dr. Tanna Furry recommendation to hold Xarelto for 3 days d/t injection. Romie Minus verbalized understanding.

## 2018-11-30 ENCOUNTER — Telehealth: Payer: Self-pay | Admitting: Cardiology

## 2018-11-30 DIAGNOSIS — R609 Edema, unspecified: Secondary | ICD-10-CM

## 2018-11-30 MED ORDER — FUROSEMIDE 40 MG PO TABS: 40.0000 mg | ORAL_TABLET | Freq: Every day | ORAL | 3 refills | Status: DC

## 2018-11-30 NOTE — Telephone Encounter (Signed)
Left detailed message on wife's phone.  Per Dr. Lovena Le have patient start Lasix 40 mg daily and BmP in one week in [place].

## 2018-11-30 NOTE — Telephone Encounter (Signed)
Patient wife calling wanting to know if patient can get a prescription for Lasix. She said they discussed this some w/ MD during the 11/01/2018 appt w/ [facility]. Patient weight gain of 7 lbs since 11/01/2018. Pt sleeps laying down w/ about a 25 degree incline / slightly upright. This is how he normally sleeps. Informed pt wife that I would send a message to MD and his nurse for further recommendations.

## 2018-12-07 ENCOUNTER — Telehealth: Payer: Self-pay | Admitting: Cardiology

## 2018-12-07 NOTE — Telephone Encounter (Signed)
[place] office is closed.If patient has lab slip already, he can go to [facility], enter through short stay and register there for labs to be drawn

## 2018-12-07 NOTE — Telephone Encounter (Addendum)
Attempted to reach pt's wife, left message.Patient will need to stop by [facility] office and get printed lab slip to take to [facility]    We will mail lab slip, wife aware

## 2018-12-07 NOTE — Telephone Encounter (Signed)
Patient wife called and left voicemail requesting a call back to discuss where pt needs to go the get lab work completed at. Please call her back to discuss where to get the labs.

## 2018-12-07 NOTE — Telephone Encounter (Signed)
Robert [name], according to documentation in 11/30/18 telephone note by Willeen Cass, RN, pt is to have lab work (BMP) in [place] in 1 week. The order is in [place].  She sent this telephone note to [facility] triage for follow up.  The patient needs to know where in [place] to go to have his labwork drawn.  Are labs drawn in your office or does he need to report somewhere else? Thank you

## 2018-12-07 NOTE — Telephone Encounter (Signed)
Pt wife called back and I informed her that pt needed to go to the [place] office to have the labs drawn according to MD nurse. Pt wife verbalized understanding.

## 2018-12-07 NOTE — Telephone Encounter (Signed)
LMOVM for pt wife to return call.

## 2018-12-07 NOTE — Telephone Encounter (Signed)
Will forward to [facility]. As pt sees Dr.Taylor there.

## 2018-12-13 ENCOUNTER — Other Ambulatory Visit (HOSPITAL_COMMUNITY)
Admission: RE | Admit: 2018-12-13 | Discharge: 2018-12-13 | Disposition: A | Discharge status: Home / Self Care | Payer: BLUE CROSS/BLUE SHIELD | Source: Ambulatory Visit | Attending: Internal Medicine | Admitting: Internal Medicine

## 2018-12-13 ENCOUNTER — Other Ambulatory Visit: Payer: Self-pay

## 2018-12-13 DIAGNOSIS — R609 Edema, unspecified: Secondary | ICD-10-CM | POA: Diagnosis present

## 2018-12-13 LAB — BASIC METABOLIC PANEL
Anion gap: 12 (ref 5–15)
BUN: 29 mg/dL — ABNORMAL HIGH (ref 8–23)
CO2: 26 mmol/L (ref 22–32)
Calcium: 9.9 mg/dL (ref 8.9–10.3)
Chloride: 100 mmol/L (ref 98–111)
Creatinine, Ser: 1.4 mg/dL — ABNORMAL HIGH (ref 0.61–1.24)
GFR calc Af Amer: 52 mL/min — ABNORMAL LOW (ref >60)
GFR calc non Af Amer: 45 mL/min — ABNORMAL LOW (ref >60)
Glucose, Bld: 86 mg/dL (ref 70–99)
Potassium: 3.9 mmol/L (ref 3.5–5.1)
Sodium: 138 mmol/L (ref 135–145)

## 2019-01-24 ENCOUNTER — Telehealth: Payer: Self-pay | Admitting: Pulmonary Disease

## 2019-01-24 ENCOUNTER — Ambulatory Visit: Payer: BLUE CROSS/BLUE SHIELD | Admitting: Nurse Practitioner

## 2019-01-24 NOTE — Telephone Encounter (Signed)
Spoke with Romie Minus, pt wife (DPR).  Pt having blood tinged mucus, is on xarelto.  Has been having this for a while.  Does not feel nebs work but wife states it loosen up mucus.  Few "gobs of dark blood" with cough.  SOB and wheezing.  appt with TN at 2pm.  Nothing further is needed.

## 2019-01-25 ENCOUNTER — Encounter: Payer: Self-pay | Admitting: Nurse Practitioner

## 2019-01-25 ENCOUNTER — Other Ambulatory Visit: Payer: Self-pay

## 2019-01-25 ENCOUNTER — Ambulatory Visit (INDEPENDENT_AMBULATORY_CARE_PROVIDER_SITE_OTHER): Payer: BLUE CROSS/BLUE SHIELD | Admitting: Nurse Practitioner

## 2019-01-25 DIAGNOSIS — J471 Bronchiectasis with (acute) exacerbation: Secondary | ICD-10-CM | POA: Insufficient documentation

## 2019-01-25 MED ORDER — PREDNISONE 10 MG PO TABS: 20.0000 mg | ORAL_TABLET | Freq: Every day | ORAL | 0 refills | Status: AC

## 2019-01-25 MED ORDER — DOXYCYCLINE HYCLATE 100 MG PO TABS: 100.0000 mg | ORAL_TABLET | Freq: Two times a day (BID) | ORAL | 0 refills | Status: DC

## 2019-01-25 NOTE — Progress Notes (Signed)
Virtual Visit via Telephone Note  I connected with Robert [name]. on 01/25/19 at  9:00 AM EDT by telephone and verified that I am speaking with the correct person using two identifiers.  Location: Patient: home Provider: office   I discussed the limitations, risks, security and privacy concerns of performing an evaluation and management service by telephone and the availability of in person appointments. I also discussed with the patient that there may be a patient responsible charge related to this service. The patient expressed understanding and agreed to proceed.   History of Present Illness: 85 year old male former smoker with bronchiectasis, history of pulmonary nodule, emphysema is followed by Dr. Lake Bells.  Patient does have a history of lung cancer with resection in 1990.  Patient has a tele-visit today for an acute visit.  Patient states that he has been having increased chest congestion and mucus production.  He states that his mucus is yellow with blood-tinged streaks.  He admits that he has not been using his hypertonic saline nebulizer treatments.  He has not been using flutter valve.  He has not been using albuterol as needed.  He did see cardiology recently for lower extremity edema and was started on Lasix 40 mg daily and states that his weight has been stable.  He denies any peripheral edema today.  Denies any recent fever.  He denies any significant shortness of breath. Denies f/c/s, n/v/d, hemoptysis, PND, leg swelling.    Observations/Objective: Chest imaging: 2015 chest x-ray images independently reviewed showing ICD, question emphysema vs basilar scarring, s/p RUL surgery 2018 high-resolution CT chest images independently reviewed showing borderline airway size in the bases bilaterally, patulous esophagus with air-fluid level, paraseptal emphysema and some mild centrilobular emphysema in the upper lobes, nonspecific intralobular fibrotic changes in a peripheral and  basilar distribution, overall indeterminate for UIP 2019 high-resolution CT scan of the chest showed persistent fibrotic changes in the right lower lobe most predominantly with mild cylindrical bronchiectasis, 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  Patient states that he has been having increased chest congestion and mucus production.  He states that his mucus is yellow with blood-tinged streaks.  He admits that he has not been using his hypertonic saline nebulizer treatments.  He has not been using flutter valve.  He has not been using albuterol as needed.  He did see cardiology recently for lower extremity edema and was started on Lasix 40 mg daily and states that his weight has been stable.  He denies any peripheral edema today.  Denies any recent fever.  He denies any significant shortness of breath.  We discussed the importance of using hypertonic saline neb treatments as directed and using a flutter valve.  We will treat for exacerbation today.  Patient Instructions  Bronchiectasis with exacerbation: Will order Doxycycline Will order prednisone Use the hypertonic saline nebulized twice a day use the flutter valve 4 to 5 breaths, 4-5 times daily May take mucinex twice daily  COPD: May use albuterol as needed   Follow Up Instructions:  Follow up with NP for  tele-visit in 2 weeks or sooner if needed    I discussed the assessment and treatment plan with the patient. The patient was provided an opportunity to ask questions  and all were answered. The patient agreed with the plan and demonstrated an understanding of the instructions.   The patient was advised to call back or seek an in-person evaluation if the symptoms worsen or if the condition fails to improve as anticipated.  I provided 22 minutes of non-face-to-face time during this encounter.   Fenton Foy, NP

## 2019-01-25 NOTE — Assessment & Plan Note (Signed)
Patient has a tele-visit today for an acute visit.  Patient states that he has been having increased chest congestion and mucus production.  He states that his mucus is yellow with blood-tinged streaks.  He admits that he has not been using his hypertonic saline nebulizer treatments.  He has not been using flutter valve.  He has not been using albuterol as needed.  He did see cardiology recently for lower extremity edema and was started on Lasix 40 mg daily and states that his weight has been stable.  He denies any peripheral edema today.  Denies any recent fever.  He denies any significant shortness of breath.  We discussed the importance of using hypertonic saline neb treatments as directed and using a flutter valve.  We will treat for exacerbation today.  Patient Instructions  Bronchiectasis with exacerbation: Will order Doxycycline Will order prednisone Use the hypertonic saline nebulized twice a day use the flutter valve 4 to 5 breaths, 4-5 times daily May take mucinex twice daily  COPD: May use albuterol as needed   Follow up: Follow up with NP for tele-visit in 2 weeks or sooner if needed

## 2019-01-25 NOTE — Patient Instructions (Addendum)
Bronchiectasis with exacerbation: Will order Doxycycline Will order prednisone Use the hypertonic saline nebulized twice a day use the flutter valve 4 to 5 breaths, 4-5 times daily May take mucinex twice daily  COPD: May use albuterol as needed   Follow up: Follow up with NP for tele-visit in 2 weeks or sooner if needed

## 2019-01-28 NOTE — Progress Notes (Signed)
Reviewed, agree 

## 2019-01-29 ENCOUNTER — Other Ambulatory Visit: Payer: Self-pay | Admitting: Internal Medicine

## 2019-01-30 NOTE — Telephone Encounter (Signed)
85yo, 210lbs, Scr 1.4 on 12/13/18, Crcl 51ml/min (previous Crcl >33ml/min, note added to f/u with Dr. Lovena Le to obtain labs, if trending below 84ml/min will change to 15mg  daily) Last OV 11/01/18 Indication afib

## 2019-01-31 ENCOUNTER — Ambulatory Visit (INDEPENDENT_AMBULATORY_CARE_PROVIDER_SITE_OTHER): Payer: BLUE CROSS/BLUE SHIELD | Admitting: *Deleted

## 2019-01-31 DIAGNOSIS — I495 Sick sinus syndrome: Secondary | ICD-10-CM | POA: Diagnosis not present

## 2019-02-01 ENCOUNTER — Telehealth: Payer: Self-pay

## 2019-02-01 NOTE — Telephone Encounter (Signed)
Spoke with patient to remind of missed remote transmission 

## 2019-02-02 LAB — CUP PACEART REMOTE DEVICE CHECK
Battery Remaining Longevity: 119 mo
Battery Voltage: 3.08 V
Brady Statistic RV Percent Paced: 51.29 %
Date Time Interrogation Session: 20200529153303
Implantable Lead Implant Date: 19950526
Implantable Lead Implant Date: 19950526
Implantable Lead Location: 753859
Implantable Lead Location: 753860
Implantable Lead Serial Number: 55140
Implantable Lead Serial Number: 55516
Implantable Pulse Generator Implant Date: 20191118
Lead Channel Impedance Value: 266 Ohm
Lead Channel Impedance Value: 361 Ohm
Lead Channel Pacing Threshold Amplitude: 2.375 V
Lead Channel Pacing Threshold Pulse Width: 0.4 ms
Lead Channel Sensing Intrinsic Amplitude: 4.625 mV
Lead Channel Sensing Intrinsic Amplitude: 4.625 mV
Lead Channel Setting Pacing Amplitude: 3 V
Lead Channel Setting Pacing Pulse Width: 0.6 ms
Lead Channel Setting Sensing Sensitivity: 1.2 mV

## 2019-02-07 ENCOUNTER — Encounter: Payer: Self-pay | Admitting: Cardiology

## 2019-02-07 NOTE — Progress Notes (Signed)
Remote pacemaker transmission.   

## 2019-02-08 ENCOUNTER — Other Ambulatory Visit: Payer: Self-pay

## 2019-02-08 ENCOUNTER — Encounter: Payer: Self-pay | Admitting: Nurse Practitioner

## 2019-02-08 ENCOUNTER — Ambulatory Visit (INDEPENDENT_AMBULATORY_CARE_PROVIDER_SITE_OTHER): Payer: BC Managed Care – PPO | Admitting: Nurse Practitioner

## 2019-02-08 DIAGNOSIS — J471 Bronchiectasis with (acute) exacerbation: Secondary | ICD-10-CM

## 2019-02-08 NOTE — Patient Instructions (Signed)
Bronchiectasis: This is the medical term that means that your airways are bigger and more dilated than normal If you have increasing chest congestion or mucus production use the hypertonic saline nebulized twice a day, use the flutter valve 4 to 5 breaths, 4-5 times a day Practice good hand hygiene Stay active  Emphysema: Use albuterol as needed   Follow up: Follow up with Dr. Lake Bells in 3-4 months or sooner if needed

## 2019-02-08 NOTE — Assessment & Plan Note (Signed)
Bronchiectasis: Exacerbation has resolved.  Patient states that he is feeling much better.  He no longer has cough with yellow sputum or any blood-tinged sputum.  Plan: Patient Instructions  Bronchiectasis: This is the medical term that means that your airways are bigger and more dilated than normal If you have increasing chest congestion or mucus production use the hypertonic saline nebulized twice a day, use the flutter valve 4 to 5 breaths, 4-5 times a day Practice good hand hygiene Stay active  Emphysema: Use albuterol as needed   Follow up: Follow up with Dr. Lake Bells in 3-4 months or sooner if needed

## 2019-02-08 NOTE — Progress Notes (Signed)
Virtual Visit via Telephone Note  I connected with Robert [name]. on 02/08/19 at  9:30 AM EDT by telephone and verified that I am speaking with the correct person using two identifiers.  Location: Patient: home Provider: office   I discussed the limitations, risks, security and privacy concerns of performing an evaluation and management service by telephone and the availability of in person appointments. I also discussed with the patient that there may be a patient responsible charge related to this service. The patient expressed understanding and agreed to proceed.   History of Present Illness: 85 year old male former smoker with bronchiectasis, history of pulmonary nodule, emphysema is followed by Dr. Lake Bells.  Patient does have a history of lung cancer with resection in 1990.  Patient has a tele-visit today for follow-up.  Was last seen by me on 01/25/2019 for exacerbation of bronchiectasis and was treated with doxycycline and prednisone.  Patient states that he feels much improved after treatment.  Is that his symptoms have resolved.  He states that he has been using his hypertonic saline nebulizer treatments as directed and is using his flutter valve.  Weight has been stable.  Denies any recent fever.  States that he no longer is coughing up yellow mucus has not seen any blood-tinged mucus.  Patient denies any significant shortness of breath.  Denies f/c/s, n/v/d, hemoptysis, PND, leg swelling.     Observations/Objective: Chest imaging: 2015 chest x-ray images independently reviewed showing ICD, question emphysema vs basilar scarring, s/p RUL surgery 2018 high-resolution CT chest images independently reviewed showing borderline airway size in the bases bilaterally, patulous esophagus with air-fluid level, paraseptal emphysema and some mild centrilobular emphysema in the upper lobes, nonspecific intralobular fibrotic changes in a peripheral and basilar distribution, overall indeterminate  for UIP 2019 high-resolution CT scan of the chest showed persistent fibrotic changes in the right lower lobe most predominantly with mild cylindrical bronchiectasis, 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  Patient states that he is feeling much better.  He no longer has cough with yellow sputum or any blood-tinged sputum.  Plan: Patient Instructions  Bronchiectasis: This is the medical term that means that your airways are bigger and more dilated than normal If you have increasing chest congestion or mucus production use the hypertonic saline nebulized twice a day, use the flutter valve 4 to 5 breaths, 4-5 times a day Practice good hand hygiene Stay active  Emphysema: Use albuterol as needed    Follow Up Instructions:  Follow up with Dr. Lake Bells in 3-4 months or sooner if needed    I discussed the assessment and treatment plan with the patient. The patient was provided an opportunity to ask questions and all were answered. The patient agreed with the plan and demonstrated an understanding of the instructions.   The patient was advised to call back or seek an in-person evaluation if the symptoms worsen or if the condition fails to improve as anticipated.  I provided 22 minutes of non-face-to-face time during this encounter.   Robert Foy, NP

## 2019-02-09 NOTE — Progress Notes (Signed)
Reviewed, agree 

## 2019-05-02 ENCOUNTER — Ambulatory Visit (INDEPENDENT_AMBULATORY_CARE_PROVIDER_SITE_OTHER): Payer: BC Managed Care – PPO | Admitting: *Deleted

## 2019-05-02 DIAGNOSIS — I4821 Permanent atrial fibrillation: Secondary | ICD-10-CM

## 2019-05-02 LAB — CUP PACEART REMOTE DEVICE CHECK
Battery Remaining Longevity: 117 mo
Battery Voltage: 3.03 V
Brady Statistic RV Percent Paced: 85.66 %
Date Time Interrogation Session: 20200827104110
Implantable Lead Implant Date: 19950526
Implantable Lead Implant Date: 19950526
Implantable Lead Location: 753859
Implantable Lead Location: 753860
Implantable Lead Serial Number: 55140
Implantable Lead Serial Number: 55516
Implantable Pulse Generator Implant Date: 20191118
Lead Channel Impedance Value: 266 Ohm
Lead Channel Impedance Value: 380 Ohm
Lead Channel Sensing Intrinsic Amplitude: 4.625 mV
Lead Channel Setting Pacing Amplitude: 3 V
Lead Channel Setting Pacing Pulse Width: 0.6 ms
Lead Channel Setting Sensing Sensitivity: 1.2 mV

## 2019-05-09 NOTE — Progress Notes (Signed)
Remote pacemaker transmission.   

## 2019-07-02 ENCOUNTER — Ambulatory Visit: Payer: BC Managed Care – PPO | Admitting: Critical Care Medicine

## 2019-07-02 ENCOUNTER — Encounter: Payer: Self-pay | Admitting: Critical Care Medicine

## 2019-07-02 ENCOUNTER — Other Ambulatory Visit: Payer: Self-pay

## 2019-07-02 VITALS — BP 124/62 | HR 61 | Temp 97.2°F | Ht 72.0 in | Wt 200.8 lb

## 2019-07-02 DIAGNOSIS — J479 Bronchiectasis, uncomplicated: Secondary | ICD-10-CM | POA: Diagnosis not present

## 2019-07-02 DIAGNOSIS — J849 Interstitial pulmonary disease, unspecified: Secondary | ICD-10-CM

## 2019-07-02 DIAGNOSIS — J432 Centrilobular emphysema: Secondary | ICD-10-CM | POA: Diagnosis not present

## 2019-07-02 MED ORDER — SODIUM CHLORIDE 3 % IN NEBU: INHALATION_SOLUTION | Freq: Two times a day (BID) | RESPIRATORY_TRACT | 12 refills | Status: DC | PRN

## 2019-07-02 NOTE — Patient Instructions (Addendum)
Thank you for visiting Dr. Carlis Abbott at [facility]. We recommend the following:   Meds ordered this encounter  Medications  . DISCONTD: sodium chloride HYPERTONIC 3 % nebulizer solution    Sig: Take by nebulization 2 (two) times daily as needed for other.    Dispense:  750 mL    Refill:  12    Dx: J84.9  . sodium chloride HYPERTONIC 3 % nebulizer solution    Sig: Take by nebulization 2 (two) times daily as needed for other.    Dispense:  750 mL    Refill:  12    Dx: J84.9    Return in about 6 months (around 12/31/2019).    Please do your part to reduce the spread of COVID-19.

## 2019-07-02 NOTE — Progress Notes (Signed)
Synopsis: Referred in November 2018 for acute ectasis by Celene Squibb, MD.  Previously patient of Dr. Lake Bells.  Subjective:   PATIENT ID: Robert [name]. GENDER: male DOB: [birth year], MRN: [id number]  Chief Complaint  Patient presents with  . Follow-up    former BQ pt being treated for bronchiectasis.  pt states he's doing well, denies any current breathing complaints.      Mr. Marone is an 85 year old gentleman with a history of a right upper lobe 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resected in 18 (in [place], [place]), emphysema, and bronchiectasis who presents for follow-up.  He was last seen in May 2020 when he went had an exacerbation of his bronchiectasis, which fully resolved with prednisone and doxycycline.  He has minimal sputum production.  He has been using his hypertonic saline nebs daily, but does not use his flutter valve.  He has no activity limitations and denies dyspnea on exertion, wheezing, cough.  He has chronic stable fatigue he attributes to his age, but no activity limitations.  He has infrequent heartburn, less than once per month, for which he takes Tums.  He has not yet had his flu shot, but plans to get one at his primary care provider's office with his wife.      Past Medical History:  Diagnosis Date  . Atrial fibrillation ([place])   . Hypothyroidism   . 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[facility])    s/p XRT and surgical removal 1990  . Pneumonia 2015  . Symptomatic bradycardia    s/p PPM 1995     Family History  Problem Relation Age of Onset  . Arrhythmia Father        Atrial fibrillation  . Diabetes Brother        Insulin dependent  . Breast cancer Sister   . Breast cancer Sister      Past Surgical History:  Procedure Laterality Date  . CATARACT EXTRACTION, BILATERAL    . LUNG REMOVAL, PARTIAL    . PACEMAKER INSERTION  1995  . PPM GENERATOR CHANGEOUT N/A 07/23/2018   Procedure: PPM GENERATOR CHANGEOUT;  Surgeon: Evans Lance, MD;  Location: [place] [facility];  Service:  Cardiovascular;  Laterality: N/A;    Social History   Socioeconomic History  . Marital status: Married    Spouse name: Not on file  . Number of children: 2  . Years of education: Not on file  . Highest education level: Not on file  Occupational History  . Occupation: Retired  Scientific laboratory technician  . Financial resource strain: Not on file  . Food insecurity    Worry: Not on file    Inability: Not on file  . Transportation needs    Medical: Not on file    Non-medical: Not on file  Tobacco Use  . Smoking status: Former Smoker    Quit date: 09/05/1988    Years since quitting: 30.8  . Smokeless tobacco: Never Used  Substance and Sexual Activity  . Alcohol use: Yes    Alcohol/week: 7.0 standard drinks    Types: 7 Standard drinks or equivalent per week    Comment: 1 drink daily  . Drug use: No  . Sexual activity: Not on file  Lifestyle  . Physical activity    Days per week: Not on file    Minutes per session: Not on file  . Stress: Not on file  Relationships  . Social Herbalist on phone: Not on file    Gets together: Not  on file    Attends religious service: Not on file    Active member of club or organization: Not on file    Attends meetings of clubs or organizations: Not on file    Relationship status: Not on file  . Intimate partner violence    Fear of current or ex partner: Not on file    Emotionally abused: Not on file    Physically abused: Not on file    Forced sexual activity: Not on file  Other Topics Concern  . Not on file  Social History Narrative   Married     No Known Allergies   Immunization History  Administered Date(s) Administered  . Influenza Split 06/06/2017  . Influenza,inj,quad, With Preservative 06/05/2018  . Pneumococcal Conjugate-13 06/15/2016    Outpatient Medications Prior to Visit  Medication Sig Dispense Refill  . albuterol (PROVENTIL HFA;VENTOLIN HFA) 108 (90 Base) MCG/ACT inhaler Inhale 2 puffs into the lungs every 4 (four)  hours as needed for wheezing or shortness of breath. 1 Inhaler 5  . furosemide (LASIX) 40 MG tablet Take 1 tablet (40 mg total) by mouth daily. 90 tablet 3  . HYDROcodone-acetaminophen (NORCO/VICODIN) 5-325 MG tablet Take 1 tablet by mouth 3 (three) times daily as needed for moderate pain.    Marland Kitchen levothyroxine (SYNTHROID, LEVOTHROID) 100 MCG tablet Take 100 mcg by mouth daily before breakfast.     . Multiple Vitamin (MULTIVITAMIN) tablet Take 1 tablet by mouth daily.      . vitamin C (ASCORBIC ACID) 500 MG tablet Take 500 mg by mouth daily.    Alveda Reasons 20 MG TABS tablet TAKE 1 TABLET BY MOUTH EVERY DAY WITH SUPPER 90 tablet 1  . sodium chloride HYPERTONIC 3 % nebulizer solution Take by nebulization 2 (two) times daily as needed for other. 750 mL 12  . cetirizine (ZYRTEC) 10 MG tablet Take 10 mg by mouth at bedtime.     Marland Kitchen desoximetasone (TOPICORT) 0.25 % cream Apply 1 application topically daily as needed (rash).     Marland Kitchen doxycycline (VIBRA-TABS) 100 MG tablet Take 1 tablet (100 mg total) by mouth 2 (two) times daily. 14 tablet 0  . fluticasone (CUTIVATE) 0.005 % ointment Apply 1 application topically daily as needed (ear rash).    . traMADol (ULTRAM) 50 MG tablet Take 50 mg by mouth 3 (three) times daily as needed for severe pain.   0   No facility-administered medications prior to visit.     Review of Systems  Constitutional: Negative for chills, fever and weight loss.  HENT: Negative for congestion.   Eyes: Negative.   Respiratory: Negative for cough, hemoptysis, sputum production and shortness of breath.   Cardiovascular: Negative for chest pain.       Chronic stable leg swelling.  Gastric sounds in precordium.  Gastrointestinal: Negative for diarrhea, nausea and vomiting.       Infrequent heartburn  Genitourinary: Negative.   Musculoskeletal: Negative for myalgias.  Skin: Negative for rash.  Neurological: Negative.      Objective:   Vitals:   07/02/19 0930  BP: 124/62  Pulse: 61   Temp: (!) 97.2 F (36.2 C)  TempSrc: Oral  SpO2: 98%  Weight: 200 lb 12.8 oz (91.1 kg)  Height: 6' (1.829 m)   98% on  RA BMI Readings from Last 3 Encounters:  07/02/19 27.23 kg/m  11/01/18 28.59 kg/m  09/11/18 28.29 kg/m   Wt Readings from Last 3 Encounters:  07/02/19 200 lb 12.8 oz (91.1 kg)  11/01/18 210 lb 12.8 oz (95.6 kg)  09/11/18 200 lb (90.7 kg)    Physical Exam Vitals signs reviewed.  Constitutional:      Appearance: Normal appearance. He is not ill-appearing.     Comments: 89 elderly man  HENT:     Head: Normocephalic and atraumatic.     Nose:     Comments: Deferred due to masking requirement.    Mouth/Throat:     Comments: Deferred due to masking requirement. Eyes:     General: No scleral icterus. Neck:     Musculoskeletal: Neck supple.     Comments: Reduced muscle mass in the right supraclavicular fossa from previous surgery Cardiovascular:     Rate and Rhythm: Normal rate and regular rhythm.  Pulmonary:     Comments: Thin comfortably on room air, no tachypnea or conversational dyspnea.  Clear to auscultation bilaterally. Abdominal:     General: There is no distension.     Palpations: Abdomen is soft.     Tenderness: There is no abdominal tenderness.  Musculoskeletal:        General: No deformity.     Comments: Minimal bilateral lower extremity pitting edema  Lymphadenopathy:     Cervical: No cervical adenopathy.  Skin:    General: Skin is warm and dry.     Findings: No rash.  Neurological:     Mental Status: He is alert.     Motor: No weakness.     Coordination: Coordination normal.  Psychiatric:        Mood and Affect: Mood normal.        Behavior: Behavior normal.      CBC    Component Value Date/Time   WBC 7.1 07/10/2018 1135   RBC 4.24 07/10/2018 1135   HGB 14.0 07/10/2018 1135   HCT 41.0 07/10/2018 1135   PLT 150 07/10/2018 1135   MCV 97 07/10/2018 1135   MCH 33.0 07/10/2018 1135   MCHC 34.1 07/10/2018 1135    RDW 12.5 07/10/2018 1135   LYMPHSABS 1.3 11/23/2016 1326   EOSABS 0.2 11/23/2016 1326   BASOSABS 0.0 11/23/2016 1326    Micro: 08/07/2017 AFB sputum-negative 08/07/2017 respiratory-normal flora  Chest Imaging- films reviewed: CT chest 08/23/2018-pleural-based scarring, mostly in the right upper lobe, mild bronchiectasis RML, lingula, lower lobes.  Centrilobular emphysema.  Patulous esophagus.  Basilar scarring in the right lower lobe.  Vascular calcification.  Pulmonary Functions Testing Results: No flowsheet data found.       Assessment & Plan:     ICD-10-CM   1. Bronchiectasis without complication (HCC)  Z61.0 sodium chloride HYPERTONIC 3 % nebulizer solution    DISCONTINUED: sodium chloride HYPERTONIC 3 % nebulizer solution    CANCELED: Alpha-1 antitrypsin phenotype    CANCELED: Alpha-1-antitrypsin    CANCELED: Cyclic citrul peptide antibody, IgG    CANCELED: Cystic Fibrosis Mutation 97    CANCELED: Fungus Culture with Smear    CANCELED: IgE    CANCELED: IgG, IgA, IgM    CANCELED:  MYCOBACTERIA, CULTURE, WITH FLUOROCHROME SMEAR    CANCELED: Respiratory or Resp and Sputum Culture    CANCELED: Rheumatoid factor    CANCELED: IgG 1, 2, 3, and 4  2. Centrilobular emphysema ([place])  J43.2   3. Interstitial pulmonary disease (HCC)  J84.9     Multilobar bronchiectasis- infrequent exacerbations.  Recovered fully from most recent exacerbation in May 2020. -Bronchiectasis labs deferred as he so infrequently exacerbates; we may consider this in the future if he has more persistent  symptoms -Continue hypertonic saline nebs daily.  Recommended adding flutter valve, especially if he has increased sputum production or has an increase in exacerbations. -Recommended he notify us if he is having exacerbations in the future.  Would prefer to avoid ICS if possible and treat with antibiotics.  At those times he should increase the frequency of his hypertonic saline nebs and flutter valve use  until his symptoms fully resolved.  We can consider obtaining sputum cultures at that time, especially if he has persistent symptoms despite antibiotics.  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  Previous tobacco abuse before quitting in 1990. -No concerning symptoms -No longer requires lung cancer screening for previous tobacco use.   RTC in 6 months or sooner as needed.   Current Outpatient Medications:  .  albuterol (PROVENTIL HFA;VENTOLIN HFA) 108 (90 Base) MCG/ACT inhaler, Inhale 2 puffs into the lungs every 4 (four) hours as needed for wheezing or shortness of breath., Disp: 1 Inhaler, Rfl: 5 .  furosemide (LASIX) 40 MG tablet, Take 1 tablet (40 mg total) by mouth daily., Disp: 90 tablet, Rfl: 3 .  HYDROcodone-acetaminophen (NORCO/VICODIN) 5-325 MG tablet, Take 1 tablet by mouth 3 (three) times daily as needed for moderate pain., Disp: , Rfl:  .  levothyroxine (SYNTHROID, LEVOTHROID) 100 MCG tablet, Take 100 mcg by mouth daily before breakfast. , Disp: , Rfl:  .  Multiple Vitamin (MULTIVITAMIN) tablet, Take 1 tablet by mouth daily.  , Disp: , Rfl:  .  sodium chloride HYPERTONIC 3 % nebulizer solution, Take by nebulization 2 (two) times daily as needed for other., Disp: 750 mL, Rfl: 12 .  vitamin C (ASCORBIC ACID) 500 MG tablet, Take 500 mg by mouth daily., Disp: , Rfl:  .  XARELTO 20 MG TABS tablet, TAKE 1 TABLET BY MOUTH EVERY DAY WITH SUPPER, Disp: 90 tablet, Rfl: 1   Julian Hy, DO [facility] 07/02/2019 10:02 AM

## 2019-07-08 ENCOUNTER — Other Ambulatory Visit: Payer: Self-pay | Admitting: Internal Medicine

## 2019-07-08 NOTE — Telephone Encounter (Signed)
Xarelto 20mg  refill request received. Pt is 85 years old, weight-91.1kg, Crea-1.28 on 02/26/2019 via [facility] from [place], last seen by Dr. Lovena Le on 11/01/2018, Diagnosis-Afib, CrCl-51.18ml/min; Dose is appropriate based on dosing criteria. Will send in refill to requested pharmacy.

## 2019-08-04 LAB — CUP PACEART REMOTE DEVICE CHECK
Battery Remaining Longevity: 113 mo
Battery Voltage: 3.02 V
Brady Statistic RV Percent Paced: 85.28 %
Date Time Interrogation Session: 20201126215716
Implantable Lead Implant Date: 19950526
Implantable Lead Implant Date: 19950526
Implantable Lead Location: 753859
Implantable Lead Location: 753860
Implantable Lead Serial Number: 55140
Implantable Lead Serial Number: 55516
Implantable Pulse Generator Implant Date: 20191118
Lead Channel Impedance Value: 285 Ohm
Lead Channel Impedance Value: 380 Ohm
Lead Channel Pacing Threshold Amplitude: 2 V
Lead Channel Pacing Threshold Pulse Width: 0.4 ms
Lead Channel Sensing Intrinsic Amplitude: 5 mV
Lead Channel Sensing Intrinsic Amplitude: 5 mV
Lead Channel Setting Pacing Amplitude: 3 V
Lead Channel Setting Pacing Pulse Width: 0.6 ms
Lead Channel Setting Sensing Sensitivity: 1.2 mV

## 2019-08-05 ENCOUNTER — Ambulatory Visit (INDEPENDENT_AMBULATORY_CARE_PROVIDER_SITE_OTHER): Payer: BC Managed Care – PPO | Admitting: *Deleted

## 2019-08-05 DIAGNOSIS — I495 Sick sinus syndrome: Secondary | ICD-10-CM

## 2019-08-08 ENCOUNTER — Other Ambulatory Visit: Payer: Self-pay

## 2019-08-08 ENCOUNTER — Encounter: Payer: Self-pay | Admitting: Internal Medicine

## 2019-08-08 ENCOUNTER — Ambulatory Visit: Payer: BC Managed Care – PPO | Admitting: Internal Medicine

## 2019-08-08 VITALS — BP 120/70 | HR 63 | Ht 72.0 in | Wt 203.8 lb

## 2019-08-08 DIAGNOSIS — Z950 Presence of cardiac pacemaker: Secondary | ICD-10-CM | POA: Diagnosis not present

## 2019-08-08 DIAGNOSIS — I495 Sick sinus syndrome: Secondary | ICD-10-CM

## 2019-08-08 DIAGNOSIS — I4821 Permanent atrial fibrillation: Secondary | ICD-10-CM

## 2019-08-08 NOTE — Progress Notes (Signed)
HPI Robert [name] returns today after undergoing PPM gen change one year ago. He is an 85 yo man with chronic atrial fib, remote sinus node dysfunction, high grade AV block, s/p PPM insertion. He has had some degradation in his RV lead function. 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 He denies chest pain or sob. He had been bothered by edema but his wife has cut out his sodium. His edema is improved. No Known Allergies   Current Outpatient Medications  Medication Sig Dispense Refill  . albuterol (PROVENTIL HFA;VENTOLIN HFA) 108 (90 Base) MCG/ACT inhaler Inhale 2 puffs into the lungs every 4 (four) hours as needed for wheezing or shortness of breath. 1 Inhaler 5  . HYDROcodone-acetaminophen (NORCO/VICODIN) 5-325 MG tablet Take 1 tablet by mouth 3 (three) times daily as needed for moderate pain.    Marland Kitchen levothyroxine (SYNTHROID, LEVOTHROID) 100 MCG tablet Take 100 mcg by mouth daily before breakfast.     . Multiple Vitamin (MULTIVITAMIN) tablet Take 1 tablet by mouth daily.      . sodium chloride HYPERTONIC 3 % nebulizer solution Take by nebulization 2 (two) times daily as needed for other. 750 mL 12  . vitamin C (ASCORBIC ACID) 500 MG tablet Take 500 mg by mouth daily.    Alveda Reasons 20 MG TABS tablet TAKE 1 TABLET BY MOUTH EVERY DAY WITH SUPPER 90 tablet 1  . furosemide (LASIX) 40 MG tablet Take 1 tablet (40 mg total) by mouth daily. 90 tablet 3   No current facility-administered medications for this visit.      Past Medical History:  Diagnosis Date  . Atrial fibrillation ([place])   . Hypothyroidism   . 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[facility])    s/p XRT and surgical removal 1990  . Pneumonia 2015  . Symptomatic bradycardia    s/p PPM 1995    ROS:   All systems reviewed and negative except as noted in the HPI.   Past Surgical History:  Procedure Laterality Date  . CATARACT EXTRACTION, BILATERAL    . LUNG REMOVAL, PARTIAL    . PACEMAKER INSERTION   1995  . PPM GENERATOR CHANGEOUT N/A 07/23/2018   Procedure: PPM GENERATOR CHANGEOUT;  Surgeon: Evans Lance, MD;  Location: [facility];  Service: Cardiovascular;  Laterality: N/A;     Family History  Problem Relation Age of Onset  . Arrhythmia Father        Atrial fibrillation  . Diabetes Brother        Insulin dependent  . Breast cancer Sister   . Breast cancer Sister      Social History   Socioeconomic History  . Marital status: Married    Spouse name: Not on file  . Number of children: 2  . Years of education: Not on file  . Highest education level: Not on file  Occupational History  . Occupation: Retired  Scientific laboratory technician  . Financial resource strain: Not on file  . Food insecurity    Worry: Not on file    Inability: Not on file  . Transportation needs    Medical: Not on file    Non-medical: Not on file  Tobacco Use  . Smoking status: Former Smoker    Quit date: 09/05/1988    Years since quitting: 30.9  . Smokeless tobacco: Never Used  Substance and Sexual Activity  . Alcohol use: Yes    Alcohol/week: 7.0 standard drinks  Types: 7 Standard drinks or equivalent per week    Comment: 1 drink daily  . Drug use: No  . Sexual activity: Not on file  Lifestyle  . Physical activity    Days per week: Not on file    Minutes per session: Not on file  . Stress: Not on file  Relationships  . Social Herbalist on phone: Not on file    Gets together: Not on file    Attends religious service: Not on file    Active member of club or organization: Not on file    Attends meetings of clubs or organizations: Not on file    Relationship status: Not on file  . Intimate partner violence    Fear of current or ex partner: Not on file    Emotionally abused: Not on file    Physically abused: Not on file    Forced sexual activity: Not on file  Other Topics Concern  . Not on file  Social History Narrative   Married     BP 120/70   Pulse 63   Ht 6'  (1.829 m)   Wt 203 lb 12.8 oz (92.4 kg)   SpO2 97%   BMI 27.64 kg/m   Physical Exam:  Well appearing NAD HEENT: Unremarkable Neck:  No JVD, no thyromegally Lymphatics:  No adenopathy Back:  No CVA tenderness Lungs:  Clear with no wheezes HEART:  Regular rate rhythm, no murmurs, no rubs, no clicks Abd:  soft, positive bowel sounds, no organomegally, no rebound, no guarding Ext:  2 plus pulses, trace edema, no cyanosis, no clubbing Skin:  No rashes no nodules Neuro:  CN II through XII intact, motor grossly intact  EKG - atrial fib with ventricular pacing  DEVICE  Normal device function.  See PaceArt for details. RV threshold stable but chronically elevated.  Assess/Plan: 1. Chronic diastolic heart failure - his symptoms are class 2. He will continue his current meds and maintain a low sodium diet. 2. Atrial fib - his symptoms and rate are well controlled.  3. PPM- his RV threshold is elevated but not worse. We have turned up his pulse width for some extra safety margin. 4. HTN - his bp is well controlled today. We will follow.  Robert [name].D.

## 2019-08-08 NOTE — Patient Instructions (Signed)
Medication Instructions:  Your physician recommends that you continue on your current medications as directed. Please refer to the Current Medication list given to you today.  Labwork: None ordered.  Testing/Procedures: None ordered.  Follow-Up: Your physician wants you to follow-up in: one year with Dr. Lovena Le in [place].   You will receive a reminder letter in the mail two months in advance. If you don't receive a letter, please call our office to schedule the follow-up appointment.  Remote monitoring is used to monitor your Pacemaker from home. This monitoring reduces the number of office visits required to check your device to one time per year. It allows [place] to keep an eye on the functioning of your device to ensure it is working properly. You are scheduled for a device check from home on 11/04/2019. You may send your transmission at any time that day. If you have a wireless device, the transmission will be sent automatically. After your physician reviews your transmission, you will receive a postcard with your next transmission date.  Any Other Special Instructions Will Be Listed Below (If Applicable).  If you need a refill on your cardiac medications before your next appointment, please call your pharmacy.

## 2019-08-28 NOTE — Progress Notes (Signed)
PPM remote 

## 2019-09-12 ENCOUNTER — Other Ambulatory Visit: Payer: Self-pay | Admitting: Internal Medicine

## 2019-09-16 ENCOUNTER — Ambulatory Visit: Payer: BC Managed Care – PPO | Attending: Internal Medicine

## 2019-09-16 ENCOUNTER — Other Ambulatory Visit: Payer: Self-pay

## 2019-09-16 DIAGNOSIS — Z20822 Contact with and (suspected) exposure to covid-19: Secondary | ICD-10-CM

## 2019-09-29 ENCOUNTER — Ambulatory Visit: Payer: BC Managed Care – PPO | Attending: Internal Medicine

## 2019-09-29 DIAGNOSIS — Z23 Encounter for immunization: Secondary | ICD-10-CM | POA: Insufficient documentation

## 2019-09-29 NOTE — Progress Notes (Signed)
   [id number]  Name:  Robert [name].    MRN: [id number] DOB: 1929-04-21  09/29/2019  Mr. Griffie was observed post Covid-19 immunization for 15 minutes without incidence. He was provided with Vaccine Information Sheet and instruction to access the V-Safe system.   Mr. Radloff was instructed to call 911 with any severe reactions post vaccine: Marland Kitchen Difficulty breathing  . Swelling of your face and throat  . A fast heartbeat  . A bad rash all over your body  . Dizziness and weakness    Immunizations Administered    Name Date Dose VIS Date Route   Pfizer COVID-19 Vaccine 09/29/2019  1:24 PM 0.3 mL 08/16/2019 Intramuscular   Manufacturer: [place]   Lot: [id number]   [place]: [id number]

## 2019-10-21 ENCOUNTER — Ambulatory Visit: Payer: BC Managed Care – PPO | Attending: Internal Medicine

## 2019-10-21 DIAGNOSIS — Z23 Encounter for immunization: Secondary | ICD-10-CM | POA: Insufficient documentation

## 2019-10-21 NOTE — Progress Notes (Signed)
   [id number]  Name:  Robert [name].    MRN: [id number] DOB: 09/30/1928  10/21/2019  [name] was observed post Covid-19 immunization for 15 minutes without incidence. He was provided with Vaccine Information Sheet and instruction to access the V-Safe system.   [name] was instructed to call 911 with any severe reactions post vaccine: Marland Kitchen Difficulty breathing  . Swelling of your face and throat  . A fast heartbeat  . A bad rash all over your body  . Dizziness and weakness    Immunizations Administered    Name Date Dose VIS Date Route   Pfizer COVID-19 Vaccine 10/21/2019  8:51 AM 0.3 mL 08/16/2019 Intramuscular   Manufacturer: [place]   Lot: [id number]   [place]: [id number]

## 2019-11-04 ENCOUNTER — Ambulatory Visit (INDEPENDENT_AMBULATORY_CARE_PROVIDER_SITE_OTHER): Payer: BC Managed Care – PPO | Admitting: *Deleted

## 2019-11-04 DIAGNOSIS — I495 Sick sinus syndrome: Secondary | ICD-10-CM

## 2019-11-04 LAB — CUP PACEART REMOTE DEVICE CHECK
Battery Remaining Longevity: 91 mo
Battery Voltage: 3 V
Brady Statistic RV Percent Paced: 80.51 %
Date Time Interrogation Session: 20210301065235
Implantable Lead Implant Date: 19950526
Implantable Lead Implant Date: 19950526
Implantable Lead Location: 753859
Implantable Lead Location: 753860
Implantable Lead Serial Number: 55140
Implantable Lead Serial Number: 55516
Implantable Pulse Generator Implant Date: 20191118
Lead Channel Impedance Value: 285 Ohm
Lead Channel Impedance Value: 380 Ohm
Lead Channel Pacing Threshold Amplitude: 2.5 V
Lead Channel Pacing Threshold Pulse Width: 0.4 ms
Lead Channel Sensing Intrinsic Amplitude: 4.875 mV
Lead Channel Sensing Intrinsic Amplitude: 4.875 mV
Lead Channel Setting Pacing Amplitude: 3 V
Lead Channel Setting Pacing Pulse Width: 1 ms
Lead Channel Setting Sensing Sensitivity: 1.2 mV

## 2019-11-04 NOTE — Progress Notes (Signed)
PPM Remote  

## 2019-11-25 ENCOUNTER — Ambulatory Visit: Payer: BC Managed Care – PPO | Admitting: Family Medicine

## 2019-11-25 ENCOUNTER — Other Ambulatory Visit: Payer: Self-pay

## 2019-11-25 DIAGNOSIS — M47819 Spondylosis without myelopathy or radiculopathy, site unspecified: Secondary | ICD-10-CM

## 2019-11-25 MED ORDER — GABAPENTIN 100 MG PO CAPS: 200.0000 mg | ORAL_CAPSULE | Freq: Every day | ORAL | 0 refills | Status: DC

## 2019-11-25 NOTE — Progress Notes (Signed)
[name] Phone: [phone] Subjective:   Robert [name], am serving as a scribe for Dr. Hulan Saas. This visit occurred during the SARS-CoV-2 public health emergency.  Safety protocols were in place, including screening questions prior to the visit, additional usage of staff PPE, and extensive cleaning of exam room while observing appropriate contact time as indicated for disinfecting solutions.    I'm seeing this patient by the request  of:  Celene Squibb, MD  CC: back pain and all over pain   FWY:OVZCHYIFOY  Robert [name]. is a 86 y.o. male coming in with complaint of chronic lower back pain. Pain is L>R. Also has cervical spine pain and right shoulder. History of lung cancer 30 years ago and had lobectomy, right side. Painful with sit to stand as well as standing. Patient does go for a walk but cannot go far due to pain. Use hydrocodone 4, 5 mg tabs daily. Try to be active. Walking is hard, PMH pancoast tumor s/p resection and continued CT scan shows    CT scanning of the lumbar spine multilevel degenerative changes small left central disc protrusion L5-S1.    Past Medical History:  Diagnosis Date  . Atrial fibrillation ([place])   . Hypothyroidism   . 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[facility])    s/p XRT and surgical removal 1990  . Pneumonia 2015  . Symptomatic bradycardia    s/p PPM 1995   Past Surgical History:  Procedure Laterality Date  . CATARACT EXTRACTION, BILATERAL    . LUNG REMOVAL, PARTIAL    . PACEMAKER INSERTION  1995  . PPM GENERATOR CHANGEOUT N/A 07/23/2018   Procedure: PPM GENERATOR CHANGEOUT;  Surgeon: Evans Lance, MD;  Location: [place] [facility];  Service: Cardiovascular;  Laterality: N/A;   Social History   Socioeconomic History  . Marital status: Married    Spouse name: Not on file  . Number of children: 2  . Years of education: Not on file  . Highest education level: Not on file    Occupational History  . Occupation: Retired  Tobacco Use  . Smoking status: Former Smoker    Quit date: 09/05/1988    Years since quitting: 31.2  . Smokeless tobacco: Never Used  Substance and Sexual Activity  . Alcohol use: Yes    Alcohol/week: 7.0 standard drinks    Types: 7 Standard drinks or equivalent per week    Comment: 1 drink daily  . Drug use: [name]  . Sexual activity: Not on file  Other Topics Concern  . Not on file  Social History Narrative   Married   Social Determinants of Health   Financial Resource Strain:   . Difficulty of Paying Living Expenses:   Food Insecurity:   . Worried About Charity fundraiser in the Last Year:   . Arboriculturist in the Last Year:   Transportation Needs:   . Film/video editor (Medical):   Marland Kitchen Lack of Transportation (Non-Medical):   Physical Activity:   . Days of Exercise per Week:   . Minutes of Exercise per Session:   Stress:   . Feeling of Stress :   Social Connections:   . Frequency of Communication with Friends and Family:   . Frequency of Social Gatherings with Friends and Family:   . Attends Religious Services:   . Active Member of Clubs or Organizations:   . Attends Archivist Meetings:   .  Marital Status:    [name] Known Allergies Family History  Problem Relation Age of Onset  . Arrhythmia Father        Atrial fibrillation  . Diabetes Brother        Insulin dependent  . Breast cancer Sister   . Breast cancer Sister     Current Outpatient Medications (Endocrine & Metabolic):  .  levothyroxine (SYNTHROID, LEVOTHROID) 100 MCG tablet, Take 100 mcg by mouth daily before breakfast.   Current Outpatient Medications (Cardiovascular):  .  furosemide (LASIX) 40 MG tablet, Take 1 tablet by mouth once daily  Current Outpatient Medications (Respiratory):  .  albuterol (PROVENTIL HFA;VENTOLIN HFA) 108 (90 Base) MCG/ACT inhaler, Inhale 2 puffs into the lungs every 4 (four) hours as needed for wheezing or shortness  of breath. .  sodium chloride HYPERTONIC 3 % nebulizer solution, Take by nebulization 2 (two) times daily as needed for other.  Current Outpatient Medications (Analgesics):  .  HYDROcodone-acetaminophen (NORCO/VICODIN) 5-325 MG tablet, Take 1 tablet by mouth 3 (three) times daily as needed for moderate pain.  Current Outpatient Medications (Hematological):  Marland Kitchen  XARELTO 20 MG TABS tablet, TAKE 1 TABLET BY MOUTH EVERY DAY WITH SUPPER  Current Outpatient Medications (Other):  .  gabapentin (NEURONTIN) 100 MG capsule, Take 2 capsules (200 mg total) by mouth at bedtime. .  Multiple Vitamin (MULTIVITAMIN) tablet, Take 1 tablet by mouth daily.   .  vitamin C (ASCORBIC ACID) 500 MG tablet, Take 500 mg by mouth daily.   Reviewed prior external information including notes and imaging from  primary care provider As well as notes that were available from care everywhere and other healthcare systems.  Past medical history, social, surgical and family history all reviewed in electronic medical record.  [name] pertanent information unless stated regarding to the chief complaint.   Review of Systems:  [name] headache, visual changes, nausea, vomiting, diarrhea, constipation, dizziness, abdominal pain, skin rash, fevers, chills, night sweats, weight loss, swollen lymph nodes,  joint swelling, chest pain, shortness of breath, mood changes. POSITIVE muscle aches, body  Objective  Blood pressure (!) 100/52, pulse (!) 53, height 6' (1.829 m), weight 208 lb (94.3 kg), SpO2 99 %.   General: [name] apparent distress alert and oriented x3 mood and affect normal, dressed appropriately.  HEENT: Pupils equal, extraocular movements intact  Respiratory: Patient's speak in full sentences and does not appear short of breath  Cardiovascular: Trace lower extremity edema, non tender, [name] erythema  Neuro: Cranial nerves II through XII are intact, neurovascularly intact in all extremities with 2+ DTRs and 2+ pulses.  Gait  antalgic MSK: Arthritic changes noted.  Patient does have significant scoliosis of the neck.  Neck exam also has loss of lordosis.  Tender to palpation diffusely negative Spurling's.  Arthritic changes in the hands noted as well     Impression and Recommendations:     This case required medical decision making of moderate complexity. The above documentation has been reviewed and is accurate and complete Robert Pulley, DO       Note: This dictation was prepared with Dragon dictation along with smaller phrase technology. Any transcriptional errors that result from this process are unintentional.

## 2019-11-25 NOTE — Patient Instructions (Signed)
Good to see you.  Ice 20 minutes 2 times daily. Usually after activity and before bed. Exercises 3 times a week.  pennsaid pinkie amount topically 2 times daily as needed.  Gabapentin 200 mg at night  Vitamin D 2000 IU daily  See me again in 4-6 weeks

## 2019-11-26 ENCOUNTER — Encounter: Payer: Self-pay | Admitting: Family Medicine

## 2019-11-26 DIAGNOSIS — M1990 Unspecified osteoarthritis, unspecified site: Secondary | ICD-10-CM | POA: Insufficient documentation

## 2019-11-26 NOTE — Assessment & Plan Note (Signed)
Patient does have significant osteoarthritic changes of multiple joints.  Patient does have the anticoagulation which makes anti-inflammatories difficult.  Topical anti-inflammatories given.  This is a social determinants of health that can change med management.  Patient also is on pain medication but did not want to increase medication if we can help it.  Increase activity slowly.  Discussed over-the-counter conditions.  Continuing pain will consider formal physical therapy follow-up again in 4 to 8 weeks

## 2019-12-30 ENCOUNTER — Other Ambulatory Visit: Payer: Self-pay | Admitting: Internal Medicine

## 2019-12-30 NOTE — Telephone Encounter (Signed)
Last OV 08/08/19 Scr 1.28 on 02/26/2019 Actual BW crcl 52 Xarelto 20mg  sent to pharmacy

## 2019-12-31 ENCOUNTER — Ambulatory Visit: Payer: BC Managed Care – PPO | Admitting: Family Medicine

## 2020-02-03 LAB — CUP PACEART REMOTE DEVICE CHECK
Battery Remaining Longevity: 78 mo
Battery Voltage: 3 V
Brady Statistic RV Percent Paced: 83.45 %
Date Time Interrogation Session: 20210530202411
Implantable Lead Implant Date: 19950526
Implantable Lead Implant Date: 19950526
Implantable Lead Location: 753859
Implantable Lead Location: 753860
Implantable Lead Serial Number: 55140
Implantable Lead Serial Number: 55516
Implantable Pulse Generator Implant Date: 20191118
Lead Channel Impedance Value: 285 Ohm
Lead Channel Impedance Value: 399 Ohm
Lead Channel Pacing Threshold Amplitude: 2.5 V
Lead Channel Pacing Threshold Pulse Width: 0.4 ms
Lead Channel Sensing Intrinsic Amplitude: 5 mV
Lead Channel Sensing Intrinsic Amplitude: 5 mV
Lead Channel Setting Pacing Amplitude: 3 V
Lead Channel Setting Pacing Pulse Width: 1 ms
Lead Channel Setting Sensing Sensitivity: 1.2 mV

## 2020-02-05 ENCOUNTER — Ambulatory Visit (INDEPENDENT_AMBULATORY_CARE_PROVIDER_SITE_OTHER): Payer: BC Managed Care – PPO | Admitting: *Deleted

## 2020-02-05 DIAGNOSIS — I4821 Permanent atrial fibrillation: Secondary | ICD-10-CM

## 2020-02-05 DIAGNOSIS — I495 Sick sinus syndrome: Secondary | ICD-10-CM

## 2020-02-07 NOTE — Progress Notes (Signed)
Remote pacemaker transmission.   

## 2020-05-06 ENCOUNTER — Ambulatory Visit (INDEPENDENT_AMBULATORY_CARE_PROVIDER_SITE_OTHER): Payer: BC Managed Care – PPO | Admitting: *Deleted

## 2020-05-06 DIAGNOSIS — I495 Sick sinus syndrome: Secondary | ICD-10-CM | POA: Diagnosis not present

## 2020-05-06 LAB — CUP PACEART REMOTE DEVICE CHECK
Battery Remaining Longevity: 73 mo
Battery Voltage: 2.99 V
Brady Statistic RV Percent Paced: 86.21 %
Date Time Interrogation Session: 20210901084943
Implantable Lead Implant Date: 19950526
Implantable Lead Implant Date: 19950526
Implantable Lead Location: 753859
Implantable Lead Location: 753860
Implantable Lead Serial Number: 55140
Implantable Lead Serial Number: 55516
Implantable Pulse Generator Implant Date: 20191118
Lead Channel Impedance Value: 266 Ohm
Lead Channel Impedance Value: 380 Ohm
Lead Channel Pacing Threshold Amplitude: 2.5 V
Lead Channel Pacing Threshold Pulse Width: 0.4 ms
Lead Channel Sensing Intrinsic Amplitude: 5 mV
Lead Channel Sensing Intrinsic Amplitude: 5 mV
Lead Channel Setting Pacing Amplitude: 3 V
Lead Channel Setting Pacing Pulse Width: 1 ms
Lead Channel Setting Sensing Sensitivity: 1.2 mV

## 2020-05-08 NOTE — Progress Notes (Signed)
Remote pacemaker transmission.   

## 2020-05-13 ENCOUNTER — Encounter: Payer: Self-pay | Admitting: Critical Care Medicine

## 2020-05-13 ENCOUNTER — Other Ambulatory Visit: Payer: Self-pay

## 2020-05-13 ENCOUNTER — Ambulatory Visit: Payer: BC Managed Care – PPO | Admitting: Critical Care Medicine

## 2020-05-13 VITALS — BP 124/62 | HR 60 | Temp 98.0°F | Ht 72.0 in | Wt 192.4 lb

## 2020-05-13 DIAGNOSIS — J479 Bronchiectasis, uncomplicated: Secondary | ICD-10-CM

## 2020-05-13 DIAGNOSIS — J432 Centrilobular emphysema: Secondary | ICD-10-CM | POA: Diagnosis not present

## 2020-05-13 MED ORDER — STIOLTO RESPIMAT 2.5-2.5 MCG/ACT IN AERS: 2.0000 | INHALATION_SPRAY | Freq: Every day | RESPIRATORY_TRACT | 11 refills | Status: DC

## 2020-05-13 MED ORDER — SODIUM CHLORIDE 3 % IN NEBU: INHALATION_SOLUTION | Freq: Two times a day (BID) | RESPIRATORY_TRACT | 12 refills | Status: DC | PRN

## 2020-05-13 NOTE — Patient Instructions (Addendum)
Thank you for visiting Dr. Carlis Abbott at [facility]. We recommend the following: Orders Placed This Encounter  Procedures  . Respiratory or Resp and Sputum Culture  . Acid Fast Smear+Cx/Rflx, Complete   Orders Placed This Encounter  Procedures  . Respiratory or Resp and Sputum Culture    Standing Status:   Future    Standing Expiration Date:   11/10/2020  . Acid Fast Smear+Cx/Rflx, Complete    Meds ordered this encounter  Medications  . Tiotropium Bromide-Olodaterol (STIOLTO RESPIMAT) 2.5-2.5 MCG/ACT AERS    Sig: Inhale 2 puffs into the lungs daily.    Dispense:  4 g    Refill:  11  . sodium chloride HYPERTONIC 3 % nebulizer solution    Sig: Take by nebulization 2 (two) times daily as needed for other.    Dispense:  750 mL    Refill:  12    Dx: J84.9    Start Stiolto once daily.  Use your saline nebulizers at least two times per day. Add your flutter valve to your regimen- 10x 10 repetitions.    Return in about 3 months (around 08/12/2020).  Dr. Silas Flood  (30 minute visit)    Please do your part to reduce the spread of COVID-19.

## 2020-05-13 NOTE — Progress Notes (Signed)
Synopsis: Referred in November 2018 for acute ectasis by Celene Squibb, MD.  Previously patient of Dr. Lake Bells.  Subjective:   PATIENT ID: Robert [name]. GENDER: male DOB: 28-Apr-1929, MRN: [id number]  Chief Complaint  Patient presents with  . Follow-up    Coughing at night. SOB with activity. Upper abdominal pain.    Robert [name] is an 86 year old gentleman who presents for follow-up.  He is accompanied today by his wife.  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  He complains of progressively worsening shortness of breath with activity.  He does not have symptoms at rest.  He has nighttime wheezing and coughing and expectorates yellowish-brown sputum.  Some nights he wakes up more than others.  He has been using his albuterol more in the past 2 months.  His nocturnal symptoms have improved some with using Flonase before going to bed.  He does hypertonic saline once per day at baseline, but has been using 3-4 times per day since he and his wife moved a few weeks ago.  He has not been using a flutter valve, although he has one at home.  He has abdominal muscle pain from moving, which makes it hard to cough.       07/02/19: Robert [name] is an 86 year old gentleman with a history of a right upper lobe 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resected in 69 (in [place]), emphysema, and bronchiectasis who presents for follow-up.  He was last seen in May 2020 when he went had an exacerbation of his bronchiectasis, which fully resolved with prednisone and doxycycline.  He has minimal sputum production.  He has been using his hypertonic saline nebs daily, but does not use his flutter valve.  He has no activity limitations and denies dyspnea on exertion, wheezing, cough.  He has chronic stable fatigue he attributes to his age, but no activity limitations.  He has infrequent heartburn, less than once per month, for which he takes Tums.  He has not yet had his flu shot,  but plans to get one at his primary care provider's office with his wife.     Past Medical History:  Diagnosis Date  . Atrial fibrillation ([place])   . Hypothyroidism   . 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[facility])    s/p XRT and surgical removal 1990  . Pneumonia 2015  . Symptomatic bradycardia    s/p PPM 1995     Family History  Problem Relation Age of Onset  . Arrhythmia Father        Atrial fibrillation  . Diabetes Brother        Insulin dependent  . Breast cancer Sister   . Breast cancer Sister      Past Surgical History:  Procedure Laterality Date  . CATARACT EXTRACTION, BILATERAL    . LUNG REMOVAL, PARTIAL    . PACEMAKER INSERTION  1995  . PPM GENERATOR CHANGEOUT N/A 07/23/2018   Procedure: PPM GENERATOR CHANGEOUT;  Surgeon: Evans Lance, MD;  Location: [place] [facility];  Service: Cardiovascular;  Laterality: N/A;    Social History   Socioeconomic History  . Marital status: Married    Spouse name: Not on file  . Number of children: 2  . Years of education: Not on file  . Highest education level: Not on file  Occupational History  . Occupation: Retired  Tobacco Use  . Smoking status: Former Smoker    Quit date: 09/05/1988    Years since quitting:  31.7  . Smokeless tobacco: Never Used  Vaping Use  . Vaping Use: Never used  Substance and Sexual Activity  . Alcohol use: Yes    Alcohol/week: 7.0 standard drinks    Types: 7 Standard drinks or equivalent per week    Comment: 1 drink daily  . Drug use: No  . Sexual activity: Not on file  Other Topics Concern  . Not on file  Social History Narrative   Married   Social Determinants of Health   Financial Resource Strain:   . Difficulty of Paying Living Expenses: Not on file  Food Insecurity:   . Worried About Charity fundraiser in the Last Year: Not on file  . Ran Out of Food in the Last Year: Not on file  Transportation Needs:   . Lack of Transportation (Medical): Not on file  . Lack of Transportation (Non-Medical):  Not on file  Physical Activity:   . Days of Exercise per Week: Not on file  . Minutes of Exercise per Session: Not on file  Stress:   . Feeling of Stress : Not on file  Social Connections:   . Frequency of Communication with Friends and Family: Not on file  . Frequency of Social Gatherings with Friends and Family: Not on file  . Attends Religious Services: Not on file  . Active Member of Clubs or Organizations: Not on file  . Attends Archivist Meetings: Not on file  . Marital Status: Not on file  Intimate Partner Violence:   . Fear of Current or Ex-Partner: Not on file  . Emotionally Abused: Not on file  . Physically Abused: Not on file  . Sexually Abused: Not on file     No Known Allergies   Immunization History  Administered Date(s) Administered  . Influenza Split 06/06/2017  . Influenza,inj,quad, With Preservative 06/05/2018  . PFIZER SARS-COV-2 Vaccination 09/29/2019, 10/21/2019  . Pneumococcal Conjugate-13 06/15/2016    Outpatient Medications Prior to Visit  Medication Sig Dispense Refill  . albuterol (PROVENTIL HFA;VENTOLIN HFA) 108 (90 Base) MCG/ACT inhaler Inhale 2 puffs into the lungs every 4 (four) hours as needed for wheezing or shortness of breath. 1 Inhaler 5  . furosemide (LASIX) 40 MG tablet Take 1 tablet by mouth once daily 90 tablet 3  . HYDROcodone-acetaminophen (NORCO/VICODIN) 5-325 MG tablet Take 1 tablet by mouth 3 (three) times daily as needed for moderate pain.    Marland Kitchen levothyroxine (SYNTHROID, LEVOTHROID) 100 MCG tablet Take 100 mcg by mouth daily before breakfast.     . Multiple Vitamin (MULTIVITAMIN) tablet Take 1 tablet by mouth daily.      . sodium chloride HYPERTONIC 3 % nebulizer solution Take by nebulization 2 (two) times daily as needed for other. 750 mL 12  . vitamin C (ASCORBIC ACID) 500 MG tablet Take 500 mg by mouth daily.    Alveda Reasons 20 MG TABS tablet TAKE 1 TABLET BY MOUTH EVERY DAY WITH SUPPER 90 tablet 1  . gabapentin  (NEURONTIN) 100 MG capsule Take 2 capsules (200 mg total) by mouth at bedtime. 180 capsule 0   No facility-administered medications prior to visit.    Review of Systems  Constitutional: Negative for chills, fever and weight loss.  HENT: Negative for congestion.   Eyes: Negative.   Respiratory: Negative for cough, hemoptysis, sputum production and shortness of breath.   Cardiovascular: Negative for chest pain.       Chronic stable leg swelling.  Gastric sounds in precordium.  Gastrointestinal:  Negative for diarrhea, nausea and vomiting.       Infrequent heartburn  Genitourinary: Negative.   Musculoskeletal: Negative for myalgias.  Skin: Negative for rash.  Neurological: Negative.      Objective:   Vitals:   05/13/20 1408  BP: 124/62  Pulse: 60  Temp: 98 F (36.7 C)  TempSrc: Oral  SpO2: 97%  Weight: 192 lb 6.4 oz (87.3 kg)  Height: 6' (1.829 m)   97% on  RA BMI Readings from Last 3 Encounters:  05/13/20 26.09 kg/m  11/25/19 28.21 kg/m  08/08/19 27.64 kg/m   Wt Readings from Last 3 Encounters:  05/13/20 192 lb 6.4 oz (87.3 kg)  11/25/19 208 lb (94.3 kg)  08/08/19 203 lb 12.8 oz (92.4 kg)    Physical Exam Vitals reviewed.  Constitutional:      Appearance: Normal appearance. He is not ill-appearing.     Comments: Appears stated age.  HENT:     Head: Normocephalic and atraumatic.  Eyes:     General: No scleral icterus. Cardiovascular:     Rate and Rhythm: Normal rate and regular rhythm.     Heart sounds: No murmur heard.   Pulmonary:     Comments: Speaking in full sentences, no conversational dyspnea.  Breathing comfortably on room air.  No wheezing, faint rhonchi bilaterally.  No significant coughing during visit. Abdominal:     General: There is no distension.     Comments: Abdominal exam with tenderness to palpation but no guarding in bilateral upper quadrants.  Unable to lay flat given inability to lay his head down and the strain on his abdominal  muscle wall.  Musculoskeletal:        General: No swelling or deformity.     Cervical back: Neck supple.  Lymphadenopathy:     Cervical: No cervical adenopathy.  Skin:    General: Skin is warm and dry.     Findings: No rash.  Neurological:     General: No focal deficit present.     Mental Status: He is alert.     Motor: No weakness.     Coordination: Coordination normal.  Psychiatric:        Mood and Affect: Mood normal.        Behavior: Behavior normal.      CBC    Component Value Date/Time   WBC 7.1 07/10/2018 1135   RBC 4.24 07/10/2018 1135   HGB 14.0 07/10/2018 1135   HCT 41.0 07/10/2018 1135   PLT 150 07/10/2018 1135   MCV 97 07/10/2018 1135   MCH 33.0 07/10/2018 1135   MCHC 34.1 07/10/2018 1135   RDW 12.5 07/10/2018 1135   LYMPHSABS 1.3 11/23/2016 1326   EOSABS 0.2 11/23/2016 1326   BASOSABS 0.0 11/23/2016 1326    Micro: 08/07/2017 AFB sputum-negative 08/07/2017 respiratory-normal flora  Chest Imaging- films reviewed: CT chest 08/23/2018-pleural-based scarring, mostly in the right upper lobe, mild bronchiectasis RML, lingula, lower lobes.  Centrilobular emphysema.  Patulous esophagus.  Basilar scarring in the right lower lobe.  Vascular calcification.  Pulmonary Functions Testing Results: No flowsheet data found.       Assessment & Plan:     ICD-10-CM   1. Bronchiectasis without complication (HCC)  D78.2     Multilobar bronchiectasis-symptoms worse recently than his previous baseline..  Recovered fully from most recent exacerbation in May 2020. -AFB and respiratory cultures today.  If positive cultures, will prescribe antibiotics. -Continue hypertonic saline nebs 2-3 times daily.  Recommend resuming use of  his flutter valve. -Please avoid ICS given increased risk of infection. -Start Stiolto once daily.  If not covered by insurance, appropriate alternatives would be Bevespi twice daily or Anoro once daily. 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  Previous tobacco abuse before quitting in 1990. -No concerning symptoms -No longer requires lung cancer screening for previous tobacco use.   RTC in 3 months with Dr. Silas Flood.   Current Outpatient Medications:  .  albuterol (PROVENTIL HFA;VENTOLIN HFA) 108 (90 Base) MCG/ACT inhaler, Inhale 2 puffs into the lungs every 4 (four) hours as needed for wheezing or shortness of breath., Disp: 1 Inhaler, Rfl: 5 .  furosemide (LASIX) 40 MG tablet, Take 1 tablet by mouth once daily, Disp: 90 tablet, Rfl: 3 .  HYDROcodone-acetaminophen (NORCO/VICODIN) 5-325 MG tablet, Take 1 tablet by mouth 3 (three) times daily as needed for moderate pain., Disp: , Rfl:  .  levothyroxine (SYNTHROID, LEVOTHROID) 100 MCG tablet, Take 100 mcg by mouth daily before breakfast. , Disp: , Rfl:  .  Multiple Vitamin (MULTIVITAMIN) tablet, Take 1 tablet by mouth daily.  , Disp: , Rfl:  .  sodium chloride HYPERTONIC 3 % nebulizer solution, Take by nebulization 2 (two) times daily as needed for other., Disp: 750 mL, Rfl: 12 .  vitamin C (ASCORBIC ACID) 500 MG tablet, Take 500 mg by mouth daily., Disp: , Rfl:  .  XARELTO 20 MG TABS tablet, TAKE 1 TABLET BY MOUTH EVERY DAY WITH SUPPER, Disp: 90 tablet, Rfl: 1 .  gabapentin (NEURONTIN) 100 MG capsule, Take 2 capsules (200 mg total) by mouth at bedtime., Disp: 180 capsule, Rfl: 0   Julian Hy, DO [facility] 05/13/2020 2:12 PM

## 2020-06-03 ENCOUNTER — Other Ambulatory Visit: Payer: Self-pay | Admitting: Chiropractic Medicine

## 2020-06-03 DIAGNOSIS — M546 Pain in thoracic spine: Secondary | ICD-10-CM

## 2020-06-12 ENCOUNTER — Other Ambulatory Visit: Payer: Self-pay

## 2020-06-12 ENCOUNTER — Ambulatory Visit
Admission: RE | Admit: 2020-06-12 | Discharge: 2020-06-12 | Disposition: A | Discharge status: Home / Self Care | Payer: BC Managed Care – PPO | Source: Ambulatory Visit | Attending: Chiropractic Medicine | Admitting: Chiropractic Medicine

## 2020-06-12 DIAGNOSIS — M546 Pain in thoracic spine: Secondary | ICD-10-CM

## 2020-06-21 ENCOUNTER — Other Ambulatory Visit: Payer: Self-pay | Admitting: Internal Medicine

## 2020-06-22 ENCOUNTER — Telehealth: Payer: Self-pay

## 2020-06-22 NOTE — Telephone Encounter (Signed)
Patient wife called in and wants to know if you can give her a call about patient needing a procedure done and they told him he needs to stop his xarelto for 4 days and wants to make sure this is okay to do

## 2020-06-22 NOTE — Telephone Encounter (Signed)
I think we're ok to refill current dose since he just dropped below the cutoff and agree with rechecking labs and weight at his visit in 2 months. If CrCl stays < 50, we can reduce his Xarelto dose then    Discussed with Fuller Canada, PharmD.  Will refill current rx x 1 refill only (90 day supply) then reassess at next refill based on present weight and labwork.

## 2020-06-22 NOTE — Telephone Encounter (Signed)
Spoke with Pt's wife.  Pt is planning on having a procedure where they inject cement in his spine.  This will require clearance.  Advised wife to have [facility] send clearance to our office.  Request for surgical clearance:  1. What type of surgery is being performed?   2. When is this surgery scheduled?    3. What type of clearance is required (medical clearance vs. Pharmacy clearance to hold med vs. Both)? BOTH  4. Are there any medications that need to be held prior to surgery and how long?  5. Practice name and name of physician performing surgery? [facility]; Levy Pupa, [place]   6. What is your office phone number [phone]    7.   What is your office fax number [phone]  8.   Anesthesia type (None, local, MAC, general) ?

## 2020-06-22 NOTE — Telephone Encounter (Signed)
Pt last saw Dr Lovena Le 08/08/19, last labs 02/26/19 Creat 1.28 at [place] per [place], overdue for labwork.  Pt has f/u appt 08/19/20 with Dr Lovena Le in [place], placed note on appt needs CBC and BMP at [place].  Age 86, weight 87.3kg, CrCl 48.31.  Based on CrCl pt is not on appropriate dosage of Xarelto.

## 2020-06-26 NOTE — Telephone Encounter (Signed)
Will remove from pre-op pool and wait for official clearance form from [facility].

## 2020-06-29 ENCOUNTER — Other Ambulatory Visit: Payer: BC Managed Care – PPO

## 2020-06-29 DIAGNOSIS — J479 Bronchiectasis, uncomplicated: Secondary | ICD-10-CM

## 2020-06-29 NOTE — Addendum Note (Signed)
Addended by: Lia Foyer R on: 06/29/2020 10:04 AM   Modules accepted: Orders

## 2020-06-30 ENCOUNTER — Telehealth (HOSPITAL_COMMUNITY): Payer: Self-pay | Admitting: Radiology

## 2020-06-30 NOTE — Telephone Encounter (Signed)
Pt's wife called to say that she and her husband have discussed it and have decided to hold off on the T7 VP for now. They will call back if anything changes. JM

## 2020-07-01 ENCOUNTER — Telehealth: Payer: Self-pay | Admitting: Pulmonary Disease

## 2020-07-01 MED ORDER — AMOXICILLIN-POT CLAVULANATE 875-125 MG PO TABS: 1.0000 | ORAL_TABLET | Freq: Two times a day (BID) | ORAL | 0 refills | Status: DC

## 2020-07-01 NOTE — Telephone Encounter (Signed)
Pt notified of recs per Dr Carlis Abbott and rx was sent

## 2020-07-01 NOTE — Telephone Encounter (Signed)
Spoke with pt's wife, Romie Minus. States that pt is not feeling well. Reports increased coughing, chest congestion and slight fever. Cough is producing brown mucus with blood streaks at times. Denies chest tightness, wheezing or shortness of breath. Pt has sputum culture pending at this time. Romie Minus would like to know what can be done for the pt in the meantime until those results come back.  Dr. Silas Flood - please advise.  *Has new pt OV with Dr. Silas Flood on 07/21/20.

## 2020-07-01 NOTE — Telephone Encounter (Signed)
Lm for patient.  

## 2020-07-01 NOTE — Telephone Encounter (Signed)
Please start Augmentin BID for 10 days- prescription just sent to [facility] on [address]. If worsening or developing low oxygen saturations or significant SOB, he needs to present to the ED.  Julian Hy, DO 07/01/20 5:03 PM [facility]

## 2020-07-03 ENCOUNTER — Ambulatory Visit: Payer: BC Managed Care – PPO | Admitting: Adult Health

## 2020-07-03 ENCOUNTER — Telehealth: Payer: Self-pay | Admitting: Critical Care Medicine

## 2020-07-03 ENCOUNTER — Ambulatory Visit (INDEPENDENT_AMBULATORY_CARE_PROVIDER_SITE_OTHER): Payer: BC Managed Care – PPO

## 2020-07-03 ENCOUNTER — Encounter: Payer: Self-pay | Admitting: Adult Health

## 2020-07-03 ENCOUNTER — Other Ambulatory Visit: Payer: Self-pay

## 2020-07-03 VITALS — BP 122/70 | HR 89 | Temp 97.1°F | Ht 71.0 in | Wt 190.0 lb

## 2020-07-03 DIAGNOSIS — J471 Bronchiectasis with (acute) exacerbation: Secondary | ICD-10-CM

## 2020-07-03 DIAGNOSIS — R0602 Shortness of breath: Secondary | ICD-10-CM | POA: Diagnosis not present

## 2020-07-03 LAB — COMPREHENSIVE METABOLIC PANEL
ALT: 52 U/L (ref 0–53)
AST: 81 U/L — ABNORMAL HIGH (ref 0–37)
Albumin: 3.9 g/dL (ref 3.5–5.2)
Alkaline Phosphatase: 114 U/L (ref 39–117)
BUN: 26 mg/dL — ABNORMAL HIGH (ref 6–23)
CO2: 26 mEq/L (ref 19–32)
Calcium: 9.7 mg/dL (ref 8.4–10.5)
Chloride: 99 mEq/L (ref 96–112)
Creatinine, Ser: 1.43 mg/dL (ref 0.40–1.50)
GFR: 43.36 mL/min — ABNORMAL LOW (ref >60.00)
Glucose, Bld: 92 mg/dL (ref 70–99)
Potassium: 4.3 mEq/L (ref 3.5–5.1)
Sodium: 134 mEq/L — ABNORMAL LOW (ref 135–145)
Total Bilirubin: 0.9 mg/dL (ref 0.2–1.2)
Total Protein: 7.9 g/dL (ref 6.0–8.3)

## 2020-07-03 LAB — CBC WITH DIFFERENTIAL/PLATELET
Basophils Absolute: 0 10*3/uL (ref 0.0–0.1)
Basophils Relative: 0.3 % (ref 0.0–3.0)
Eosinophils Absolute: 0 10*3/uL (ref 0.0–0.7)
Eosinophils Relative: 0.7 % (ref 0.0–5.0)
HCT: 36.6 % — ABNORMAL LOW (ref 39.0–52.0)
Hemoglobin: 12.4 g/dL — ABNORMAL LOW (ref 13.0–17.0)
Lymphocytes Relative: 11.8 % — ABNORMAL LOW (ref 12.0–46.0)
Lymphs Abs: 0.9 10*3/uL (ref 0.7–4.0)
MCHC: 33.8 g/dL (ref 30.0–36.0)
MCV: 102.9 fl — ABNORMAL HIGH (ref 78.0–100.0)
Monocytes Absolute: 1.2 10*3/uL — ABNORMAL HIGH (ref 0.1–1.0)
Monocytes Relative: 15.9 % — ABNORMAL HIGH (ref 3.0–12.0)
Neutro Abs: 5.3 10*3/uL (ref 1.4–7.7)
Neutrophils Relative %: 71.3 % (ref 43.0–77.0)
Platelets: 195 10*3/uL (ref 150.0–400.0)
RBC: 3.56 Mil/uL — ABNORMAL LOW (ref 4.22–5.81)
RDW: 13.6 % (ref 11.5–15.5)
WBC: 7.5 10*3/uL (ref 4.0–10.5)

## 2020-07-03 MED ORDER — AMOXICILLIN-POT CLAVULANATE 875-125 MG PO TABS: 1.0000 | ORAL_TABLET | Freq: Two times a day (BID) | ORAL | 0 refills | Status: AC

## 2020-07-03 NOTE — Telephone Encounter (Signed)
Spoke with the pt's spouse and notified of response per PC. Appt with TP at noon today was set up.

## 2020-07-03 NOTE — Telephone Encounter (Signed)
Primary Pulmonologist: [facility] (but switching to [facility] in November) Last office visit and with whom: 05/13/20 with [facility] What do we see them for (pulmonary problems): bronchiectasis  Last OV assessment/plan:    ICD-10-CM   1. Bronchiectasis without complication (HCC)  H41.9     Multilobar bronchiectasis-symptoms worse recently than his previous baseline..  Recovered fully from most recent exacerbation in May 2020. -AFB and respiratory cultures today.  If positive cultures, will prescribe antibiotics. -Continue hypertonic saline nebs 2-3 times daily.  Recommend resuming use of his flutter valve. -Please avoid ICS given increased risk of infection. -Start Stiolto once daily.  If not covered by insurance, appropriate alternatives would be Bevespi twice daily or Anoro once daily. 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  Previous tobacco abuse before quitting in 1990. -No concerning symptoms -No longer requires lung cancer screening for previous tobacco use.   RTC in 3 months with Dr. Silas Flood.   Current Outpatient Medications:  .  albuterol (PROVENTIL HFA;VENTOLIN HFA) 108 (90 Base) MCG/ACT inhaler, Inhale 2 puffs into the lungs every 4 (four) hours as needed for wheezing or shortness of breath., Disp: 1 Inhaler, Rfl: 5 .  furosemide (LASIX) 40 MG tablet, Take 1 tablet by mouth once daily, Disp: 90 tablet, Rfl: 3 .  HYDROcodone-acetaminophen (NORCO/VICODIN) 5-325 MG tablet, Take 1 tablet by mouth 3 (three) times daily as needed for moderate pain., Disp: , Rfl:  .  levothyroxine (SYNTHROID, LEVOTHROID) 100 MCG tablet, Take 100 mcg by mouth daily before breakfast. ,  Disp: , Rfl:  .  Multiple Vitamin (MULTIVITAMIN) tablet, Take 1 tablet by mouth daily.  , Disp: , Rfl:  .  sodium chloride HYPERTONIC 3 % nebulizer solution, Take by nebulization 2 (two) times daily as needed for other., Disp: 750 mL, Rfl: 12 .  vitamin C (ASCORBIC ACID) 500 MG tablet, Take 500 mg by mouth daily., Disp: , Rfl:  .  XARELTO 20 MG TABS tablet, TAKE 1 TABLET BY MOUTH EVERY DAY WITH SUPPER, Disp: 90 tablet, Rfl: 1 .  gabapentin (NEURONTIN) 100 MG capsule, Take 2 capsules (200 mg total) by mouth at bedtime., Disp: 180 capsule, Rfl: 0   Was appointment offered to patient (explain)?  No, patient will be getting for COVID this afternoon.    Reason for call: Spoke with patient's wife Romie Minus. She stated that the patient did start the Augmentin about 3 days ago but they are not seeing any improvements. He is still suffering from extreme fatigue, no interest in doing anything. His fever has been rovering around 99.56F. Increased body aches. Still has a productive cough with brownish, greenish phlegm. She stated that his phlegm yesterday appeared to be blood tinged but she feels this is from the irritation from coughing.   She plans on taking the patient to be tested for COVID this afternoon to be on the safe side. He is fully vaccinated.   She wants to know if Dr. Carlis Abbott had other recommendations for him. She wanted to bring him in for labs since she has not had any lab work since June 2020. I advised her that based on the symptoms, we would need a negative COVID test before he would  be allowed into the office.   Dr. Carlis Abbott, please advise. Thanks!    No Known Allergies  Immunization History  Administered Date(s) Administered  . Influenza Split 06/06/2017  . Influenza, High Dose Seasonal PF 07/06/2019, 06/17/2020  . Influenza,inj,quad, With Preservative 06/05/2018  . PFIZER SARS-COV-2 Vaccination 09/29/2019, 10/21/2019  . Pneumococcal Conjugate-13 06/15/2016  . Pneumococcal-Unspecified  03/11/2019

## 2020-07-03 NOTE — Progress Notes (Signed)
@Patient  ID: Robert Clack., male    DOB: August 27, 1929, 86 y.o.   MRN: [id number]  Chief Complaint  Patient presents with  . 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 fib on Xarelto, status post PPM TEST/EVENTS :   07/03/2020 Acute OV : Cough Robert [name]  Patient presents for an acute office visit.  He complains of a 3-day history of productive cough, thick mucus chest congestion and fever T-max 100.4. Patient is a resident at [facility].  Covid vaccines x2 are up-to-date.  Flu shot is up-to-date. Patient complains of general malaise and joint discomforts. Has been dealing with increased back pain recently found out he had a compression fracture. Patient denies any hemoptysis, chest pain, orthopnea, edema. Patient was called in Augmentin 2 days ago.  Is tolerating well.  But concerned that he continues to have some low-grade fever.  This morning was 99.  No Known Allergies  Immunization History  Administered Date(s) Administered  . Influenza Split 06/06/2017  . Influenza, High Dose Seasonal PF 07/06/2019, 06/17/2020  . Influenza,inj,quad, With Preservative 06/05/2018  . PFIZER SARS-COV-2 Vaccination 09/29/2019, 10/21/2019  . Pneumococcal Conjugate-13 06/15/2016  . Pneumococcal-Unspecified 03/11/2019    Past Medical History:  Diagnosis Date  . Abnormal CT of spine 10/24/2018   Per [facility] new patient packet  . Atrial fibrillation ([place])   . Back pain    Per [facility] new patient packet  . Bradycardia    Per [facility] new patient packet  . Bronchiectasis ([place])    Per [facility] new patient packet  . COPD, mild ([place])    Per [facility] new patient packet  . Hx of colonoscopy 11/14/2007   Per [facility] new patient packet  . Hx of CT scan of chest 08/23/2018   Per [facility] new patient packet  . Hypothyroidism   . Lung  cancer [facility])    Per [facility] new patient packet  . Neck pain    Per [facility] new patient packet  . 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[facility])    s/p XRT and surgical removal 1990  . Pneumonia 2015  . Symptomatic bradycardia    s/p PPM 1995    Tobacco History: Social History   Tobacco Use  Smoking Status Former Smoker  . Packs/day: 1.00  . Years: 40.00  . Pack years: 40.00  . Types: Cigarettes  . Quit date: 09/05/1988  . Years since quitting: 31.8  Smokeless Tobacco Never Used   Counseling given: Not Answered   Outpatient Medications Prior to Visit  Medication Sig Dispense Refill  . amoxicillin-clavulanate (AUGMENTIN) 875-125 MG tablet Take 1 tablet by mouth 2 (two) times daily. 20 tablet 0  . Cholecalciferol (VITAMIN D) 50 MCG (2000 UT) tablet Take 2,000 Units by mouth daily.    Marland Kitchen docusate sodium (COLACE) 100 MG capsule Take 100 mg by mouth 2 (two) times daily.    . furosemide (LASIX) 40 MG tablet Take 1 tablet by mouth once daily 90 tablet 3  . gabapentin (NEURONTIN) 100 MG capsule Take 2 capsules (200 mg total) by mouth at bedtime. 180 capsule 0  . HYDROcodone-acetaminophen (NORCO/VICODIN) 5-325 MG tablet Take 1 tablet by mouth 3 (three) times daily as needed for moderate pain.    Marland Kitchen levothyroxine (SYNTHROID, LEVOTHROID) 100 MCG tablet Take 100 mcg by mouth daily before breakfast.     .  Magnesium 500 MG CAPS Take by mouth.    . Multiple Vitamin (MULTIVITAMIN) tablet Take 1 tablet by mouth daily.      . psyllium (REGULOID) 0.52 g capsule Take 0.52 g by mouth daily.    . rivaroxaban (XARELTO) 20 MG TABS tablet Take 1 tablet (20 mg total) by mouth daily with supper. Overdue for labwork, MUST have updated labwork for future refills. 90 tablet 0  . vitamin C (ASCORBIC ACID) 500 MG tablet Take 500 mg by mouth daily.     No facility-administered medications prior to visit.     Review of Systems:   Constitutional:   No  weight loss, night sweats,  Fevers, chills,  +fatigue, or  lassitude.  HEENT:   No  headaches,  Difficulty swallowing,  Tooth/dental problems, or  Sore throat,                No sneezing, itching, ear ache, nasal congestion, post nasal drip,   CV:  No chest pain,  Orthopnea, PND, swelling in lower extremities, anasarca, dizziness, palpitations, syncope.   GI  No heartburn, indigestion, abdominal pain, nausea, vomiting, diarrhea, change in bowel habits, loss of appetite, bloody stools.   Resp:    No chest wall deformity  Skin: no rash or lesions.  GU: no dysuria, change in color of urine, no urgency or frequency.  No flank pain, no hematuria   MS:  No joint pain or swelling.  No decreased range of motion.  No back pain.    Physical Exam  BP 122/70 (BP Location: Left Arm, Patient Position: Sitting, Cuff Size: Normal)   Pulse 89   Temp (!) 97.1 F (36.2 C) (Temporal)   Ht 5\' 11"  (1.803 m)   Wt 190 lb (86.2 kg)   SpO2 (!) 89%   BMI 26.50 kg/m   GEN: A/Ox3; pleasant , NAD, elderly male   HEENT:  [place]/AT,    NOSE-clear, THROAT-clear, no lesions, no postnasal drip or exudate noted.   NECK:  Supple w/ fair ROM; no JVD; normal carotid impulses w/o bruits; no thyromegaly or nodules palpated; no lymphadenopathy.    RESP  Few trace rhonchi   no accessory muscle use, no dullness to percussion  CARD:  RRR, no m/r/g, no peripheral edema, pulses intact, no cyanosis or clubbing.  GI:   Soft & nt; nml bowel sounds; no organomegaly or masses detected.   Musco: Warm bil, no deformities or joint swelling noted.   Neuro: alert, no focal deficits noted.    Skin: Warm, no lesions or rashes    Lab Results:   BNP No results found for: BNP  ProBNP No results found for: PROBNP  Imaging:     No flowsheet data found.  No results found for: NITRICOXIDE      Assessment & Plan:   No problem-specific Assessment & Plan notes found for this encounter.     Rexene Edison, NP 07/03/2020

## 2020-07-03 NOTE — Patient Instructions (Addendum)
Finish Augmentin as directed Fluids and rest  Mucinex Twice daily  As needed  Cough/congestion with flutter valve Tylenol As needed   COVID 19 testing  Chest xray and labs today  Follow up in 2 weeks as planned and As needed   Please contact office for sooner follow up if symptoms do not improve or worsen or seek emergency care

## 2020-07-03 NOTE — Assessment & Plan Note (Signed)
Acute exacerbation.-Check chest x-ray today. Patient needs COVID-19 testing.  Advised to call our office immediately if positive would be a candidate for the monoclonal antibody infusion if COVID-19 was positive Patient will finish antibiotics as directed.  Has a planned follow-up in 2 weeks and will keep that follow-up visit.  Lab work is pending.  Continue with pulmonary hygiene regimen with Mucinex and flutter valve.   Plan  Patient Instructions  Finish Augmentin as directed Fluids and rest  Mucinex Twice daily  As needed  Cough/congestion with flutter valve Tylenol As needed   COVID 19 testing  Chest xray and labs today  Follow up in 2 weeks as planned and As needed   Please contact office for sooner follow up if symptoms do not improve or worsen or seek emergency care

## 2020-07-03 NOTE — Telephone Encounter (Signed)
I'm sorry to hear he is still sick. My concern if he may need to be seen in person if he has not improved any on antibiotics. Do we know anything about the sputum sample that was apparently collected 4 days ago? My recommendation is an acute visit if he hasn't improved any on Augmentin. If he has improved some but isn't back to baseline, that is expected that he will be slower to improve with bronchiectasis and I would continue what he is doing.  Julian Hy, DO 07/03/20 11:14 AM [facility]

## 2020-07-03 NOTE — Telephone Encounter (Signed)
Pt wife calling and said she thinks pt's lab work may be off and that he's feeling pretty lousy,planning on getting covid tested later today. Pt has cough(for a long time) had low grade fever x4 days,extrerme fatigue. Wife wanting lab work done to check. full lab work hasn't been done since July of 2020. Has been on lasix for awhile. Just wanting some advice on what to do Pt is on Augmentin currently.Please advise. [phone]

## 2020-07-04 LAB — RESPIRATORY CULTURE OR RESPIRATORY AND SPUTUM CULTURE
MICRO NUMBER:: 11113799
RESULT:: NORMAL
SPECIMEN QUALITY:: ADEQUATE

## 2020-07-06 ENCOUNTER — Telehealth: Payer: Self-pay | Admitting: Adult Health

## 2020-07-06 NOTE — Telephone Encounter (Signed)
Called and spoke with pt's wife Romie Minus who stated that pt went to [facility] on [address] Saturday, 10/30 and had a rapid covid test performed. Romie Minus stated that the results came back negative.  Routing this to TP as an [place].

## 2020-07-07 NOTE — Progress Notes (Signed)
Called and spoke with the patient and his wife over speaker phone.  Provided test results per Rexene Edison NP.  He has a f/u scheduled with his pcp and Dr. Silas Flood.  They verbalized understanding.  He states he is feeling some better and that his covid test was negative.  Nothing further needed.

## 2020-07-08 ENCOUNTER — Telehealth: Payer: Self-pay

## 2020-07-08 NOTE — Telephone Encounter (Signed)
Returned call to Pt's wife.  She was concerned because Pt's Xarelto dose is based off of Pt's kidney function.  Reassured that Pt's lab work did not indicate a change in his Xarelto dose at this time.  Appears he has been dehydrated.  He is sick with pneumonia on antibiotics.  Encouraged Pt's wife to continue to push fluids.  Pt's wife states he is starting to feel better.

## 2020-07-08 NOTE — Telephone Encounter (Signed)
Patient wife called in and asks if Robert [name] can look at recent blood results and see if he has any concerns as she is worried he is on certain medications and wants to know if he thinks he needs to change anything. Patient has pneumonia and has been feeling bad lately

## 2020-07-21 ENCOUNTER — Ambulatory Visit: Payer: BC Managed Care – PPO | Admitting: Pulmonary Disease

## 2020-07-21 ENCOUNTER — Telehealth: Payer: Self-pay | Admitting: Pulmonary Disease

## 2020-07-21 ENCOUNTER — Ambulatory Visit: Payer: BC Managed Care – PPO

## 2020-07-21 ENCOUNTER — Encounter: Payer: Self-pay | Admitting: Pulmonary Disease

## 2020-07-21 ENCOUNTER — Other Ambulatory Visit: Payer: Self-pay

## 2020-07-21 VITALS — BP 110/64 | HR 65 | Temp 97.6°F | Ht 72.0 in | Wt 188.8 lb

## 2020-07-21 DIAGNOSIS — J690 Pneumonitis due to inhalation of food and vomit: Secondary | ICD-10-CM | POA: Diagnosis not present

## 2020-07-21 DIAGNOSIS — G8929 Other chronic pain: Secondary | ICD-10-CM

## 2020-07-21 DIAGNOSIS — J479 Bronchiectasis, uncomplicated: Secondary | ICD-10-CM

## 2020-07-21 DIAGNOSIS — M545 Low back pain, unspecified: Secondary | ICD-10-CM

## 2020-07-21 NOTE — Patient Instructions (Addendum)
Nice to meet you!  Try to gradually increase your physical atvity.  For the low back pain, I recommend you consult with the PT providers in your community.   Return for follow up with Dr. Silas Flood in 3 months.

## 2020-07-21 NOTE — Telephone Encounter (Signed)
Pt was seen today and had a cxr done.  Nothing further is needed.

## 2020-07-21 NOTE — Progress Notes (Signed)
Synopsis: Referred in November 2018 for acute ectasis by Celene Squibb, MD.  Previously patient of Dr. Lake Bells and Dr. Carlis Abbott.  Subjective:   PATIENT ID: Robert [name]. GENDER: male DOB: Jan 07, 1929, MRN: [id number]  Chief Complaint  Patient presents with  . Follow-up    SOB with activity    Mr. Turman is an 86 year old gentleman who presents for follow-up.  He is accompanied today by his wife.  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  Doing well today. Treated for bronchiectasis exacerbation. Seen in office and had CXR done with RLL infiltrate. All symptoms better on Augmentin. Has chronic low back pain that limits mobility. Less active. Has less energy. Using HTS once daily. Discussed role of increasing airway clearance during exacerbations.  Reviewed CXR 07/03/20 with RLL large infiltrate on my interpretation.    Past Medical History:  Diagnosis Date  . Abnormal CT of spine 10/24/2018   Per [place] new patient packet  . Atrial fibrillation ([place])   . Back pain    Per [place] new patient packet  . Bradycardia    Per [place] new patient packet  . Bronchiectasis ([place])    Per [place] new patient packet  . COPD, mild ([place])    Per [place] new patient packet  . Hx of colonoscopy 11/14/2007   Per [place] new patient packet  . Hx of CT scan of chest 08/23/2018   Per [place] new patient packet  . Hypothyroidism   . Lung cancer [facility])    Per [place] new patient packet  . Neck pain    Per [place] new patient packet  . 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[facility])    s/p XRT and surgical removal 1990  . Pneumonia 2015  . Symptomatic bradycardia    s/p PPM 1995     Family History  Problem Relation Age of Onset  . Arrhythmia Father        Atrial fibrillation  . Diabetes Brother        Insulin dependent  . Breast cancer Sister   . Breast cancer Sister      Past Surgical History:  Procedure Laterality Date  . CATARACT EXTRACTION, BILATERAL    . COLONOSCOPY  11/14/2007   Per  [place] new patient packet  . LUNG REMOVAL, PARTIAL    . PACEMAKER INSERTION  1995  . PPM GENERATOR CHANGEOUT N/A 07/23/2018   Procedure: PPM GENERATOR CHANGEOUT;  Surgeon: Evans Lance, MD;  Location: [place] [facility];  Service: Cardiovascular;  Laterality: N/A;    Social History   Socioeconomic History  . Marital status: Married    Spouse name: Not on file  . Number of children: 2  . Years of education: Not on file  . Highest education level: Not on file  Occupational History  . Occupation: Retired  Tobacco Use  . Smoking status: Former Smoker    Packs/day: 1.00    Years: 40.00    Pack years: 40.00    Types: Cigarettes    Quit date: 09/05/1988    Years since quitting: 31.8  . Smokeless tobacco: Never Used  Vaping Use  . Vaping Use: Never used  Substance and Sexual Activity  . Alcohol use: Yes    Alcohol/week: 7.0 standard drinks    Types: 7 Standard drinks or equivalent per week    Comment: 1 drink daily  . Drug use: No  . Sexual activity: Not on file  Other Topics Concern  .  Not on file  Social History Narrative   Diet      Do you drink/eat things with caffeine Yes      Marital Status Married What year were you married? 1957      Do you live in a house, apartment, assisted living, condo, trailer, etc.? Apartment toenhome      Is it one or more stories? One      How many persons live in your home? 2         Do you have any pets in your home?(please list) No      Highest level of education completed: Bachelors degree      Current or past profession: Psychologist, sport and exercise      Do you exercise?: Some    Type and how often: Various      Do you have a Living Will? No      Do you have a DNR form?         If not, would you like to discuss one?       Do you have signed POA/HPOA forms? No      Do you have difficulty bathing or dressing yourself? No      Do you have difficulty preparing food or eating? No      Do you have difficulty managing medications? No       Do you have difficulty managing your finances? No      Do you have difficulty affording your medications? No                  Social Determinants of Health   Financial Resource Strain:   . Difficulty of Paying Living Expenses: Not on file  Food Insecurity:   . Worried About Charity fundraiser in the Last Year: Not on file  . Ran Out of Food in the Last Year: Not on file  Transportation Needs:   . Lack of Transportation (Medical): Not on file  . Lack of Transportation (Non-Medical): Not on file  Physical Activity:   . Days of Exercise per Week: Not on file  . Minutes of Exercise per Session: Not on file  Stress:   . Feeling of Stress : Not on file  Social Connections:   . Frequency of Communication with Friends and Family: Not on file  . Frequency of Social Gatherings with Friends and Family: Not on file  . Attends Religious Services: Not on file  . Active Member of Clubs or Organizations: Not on file  . Attends Archivist Meetings: Not on file  . Marital Status: Not on file  Intimate Partner Violence:   . Fear of Current or Ex-Partner: Not on file  . Emotionally Abused: Not on file  . Physically Abused: Not on file  . Sexually Abused: Not on file     No Known Allergies   Immunization History  Administered Date(s) Administered  . Influenza Split 06/06/2017  . Influenza, High Dose Seasonal PF 07/06/2019, 06/17/2020  . Influenza,inj,quad, With Preservative 06/05/2018  . PFIZER SARS-COV-2 Vaccination 09/29/2019, 10/21/2019  . Pneumococcal Conjugate-13 06/15/2016  . Pneumococcal-Unspecified 03/11/2019    Outpatient Medications Prior to Visit  Medication Sig Dispense Refill  . amoxicillin-clavulanate (AUGMENTIN) 875-125 MG tablet Take 1 tablet by mouth 2 (two) times daily. (Patient not taking: Reported on 07/21/2020) 20 tablet 0  . Cholecalciferol (VITAMIN D) 50 MCG (2000 UT) tablet Take 2,000 Units by mouth daily.    Marland Kitchen docusate sodium (COLACE) 100 MG  capsule Take 100 mg by mouth 2 (two) times daily.    . furosemide (LASIX) 40 MG tablet Take 1 tablet by mouth once daily 90 tablet 3  . HYDROcodone-acetaminophen (NORCO/VICODIN) 5-325 MG tablet Take 1 tablet by mouth 3 (three) times daily as needed for moderate pain.    Marland Kitchen levothyroxine (SYNTHROID, LEVOTHROID) 100 MCG tablet Take 100 mcg by mouth daily before breakfast.     . Magnesium 500 MG CAPS Take by mouth.    . Multiple Vitamin (MULTIVITAMIN) tablet Take 1 tablet by mouth daily.      . psyllium (REGULOID) 0.52 g capsule Take 0.52 g by mouth daily.    . rivaroxaban (XARELTO) 20 MG TABS tablet Take 1 tablet (20 mg total) by mouth daily with supper. Overdue for labwork, MUST have updated labwork for future refills. 90 tablet 0  . vitamin C (ASCORBIC ACID) 500 MG tablet Take 500 mg by mouth daily.    Marland Kitchen gabapentin (NEURONTIN) 100 MG capsule Take 2 capsules (200 mg total) by mouth at bedtime. (Patient not taking: Reported on 07/21/2020) 180 capsule 0   No facility-administered medications prior to visit.      Objective:   Vitals:   07/21/20 1405  BP: 110/64  Pulse: 65  Temp: 97.6 F (36.4 C)  TempSrc: Oral  SpO2: 99%  Weight: 188 lb 12.8 oz (85.6 kg)  Height: 6' (1.829 m)   99% on  RA BMI Readings from Last 3 Encounters:  07/21/20 25.61 kg/m  07/03/20 26.50 kg/m  05/13/20 26.09 kg/m   Wt Readings from Last 3 Encounters:  07/21/20 188 lb 12.8 oz (85.6 kg)  07/03/20 190 lb (86.2 kg)  05/13/20 192 lb 6.4 oz (87.3 kg)    Physical examination: General: Well-appearing, no acute distress Respiratory: Clear aspiration bilaterally, no crackles or wheeze Cardiovascular: Regular rate and rhythm, no murmurs   CBC    Component Value Date/Time   WBC 7.5 07/03/2020 1225   RBC 3.56 (L) 07/03/2020 1225   HGB 12.4 (L) 07/03/2020 1225   HGB 14.0 07/10/2018 1135   HCT 36.6 (L) 07/03/2020 1225   HCT 41.0 07/10/2018 1135   PLT 195.0 07/03/2020 1225   PLT 150 07/10/2018 1135    MCV 102.9 (H) 07/03/2020 1225   MCV 97 07/10/2018 1135   MCH 33.0 07/10/2018 1135   MCHC 33.8 07/03/2020 1225   RDW 13.6 07/03/2020 1225   RDW 12.5 07/10/2018 1135   LYMPHSABS 0.9 07/03/2020 1225   LYMPHSABS 1.3 11/23/2016 1326   MONOABS 1.2 (H) 07/03/2020 1225   EOSABS 0.0 07/03/2020 1225   EOSABS 0.2 11/23/2016 1326   BASOSABS 0.0 07/03/2020 1225   BASOSABS 0.0 11/23/2016 1326    Micro: 08/07/2017 AFB sputum-negative 08/07/2017 respiratory-normal flora 06/29/2020: OP flora  Chest Imaging- films reviewed and as per EMR Pulmonary Functions Testing Results: Spirometry 2018 reviewed and interpreted as suggestive of mild restriction vs gas trapping      Assessment & Plan:     ICD-10-CM   1. Chronic low back pain, unspecified back pain laterality, unspecified whether sciatica present  M54.50 Ambulatory referral to Physical Therapy   G89.29     Multilobar bronchiectasis-possibly related to recurrent aspiration given distribution lower lobes.  Recently treated for exacerbation but truly a presumed aspiration pneumonia given right lower lobe infiltrate with Augmentin and great improvement in acute symptoms. -Continue hypertonic saline nebs twice daily: Stressed importance of increased frequency when feeling ill and on antibiotics -Please avoid ICS given increased risk of infection. 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  Previous tobacco abuse before quitting in 1990. -No concerning symptoms -No longer requires lung cancer screening for previous tobacco use.   RTC in 3 months with Dr. Silas Flood.   Current Outpatient Medications:  .  amoxicillin-clavulanate (AUGMENTIN) 875-125 MG tablet, Take 1 tablet by mouth 2 (two)  times daily. (Patient not taking: Reported on 07/21/2020), Disp: 20 tablet, Rfl: 0 .  Cholecalciferol (VITAMIN D) 50 MCG (2000 UT) tablet, Take 2,000 Units by mouth daily., Disp: , Rfl:  .  docusate sodium (COLACE) 100 MG capsule, Take 100 mg by mouth 2 (two) times daily., Disp: , Rfl:  .  furosemide (LASIX) 40 MG tablet, Take 1 tablet by mouth once daily, Disp: 90 tablet, Rfl: 3 .  HYDROcodone-acetaminophen (NORCO/VICODIN) 5-325 MG tablet, Take 1 tablet by mouth 3 (three) times daily as needed for moderate pain., Disp: , Rfl:  .  levothyroxine (SYNTHROID, LEVOTHROID) 100 MCG tablet, Take 100 mcg by mouth daily before breakfast. , Disp: , Rfl:  .  Magnesium 500 MG CAPS, Take by mouth., Disp: , Rfl:  .  Multiple Vitamin (MULTIVITAMIN) tablet, Take 1 tablet by mouth daily.  , Disp: , Rfl:  .  psyllium (REGULOID) 0.52 g capsule, Take 0.52 g by mouth daily., Disp: , Rfl:  .  rivaroxaban (XARELTO) 20 MG TABS tablet, Take 1 tablet (20 mg total) by mouth daily with supper. Overdue for labwork, MUST have updated labwork for future refills., Disp: 90 tablet, Rfl: 0 .  vitamin C (ASCORBIC ACID) 500 MG tablet, Take 500 mg by mouth daily., Disp: , Rfl:

## 2020-08-05 ENCOUNTER — Ambulatory Visit (INDEPENDENT_AMBULATORY_CARE_PROVIDER_SITE_OTHER): Payer: BC Managed Care – PPO

## 2020-08-05 DIAGNOSIS — I495 Sick sinus syndrome: Secondary | ICD-10-CM | POA: Diagnosis not present

## 2020-08-05 LAB — CUP PACEART REMOTE DEVICE CHECK
Battery Remaining Longevity: 70 mo
Battery Voltage: 2.99 V
Brady Statistic RV Percent Paced: 74.1 %
Date Time Interrogation Session: 20211130201744
Implantable Lead Implant Date: 19950526
Implantable Lead Implant Date: 19950526
Implantable Lead Location: 753859
Implantable Lead Location: 753860
Implantable Lead Serial Number: 55140
Implantable Lead Serial Number: 55516
Implantable Pulse Generator Implant Date: 20191118
Lead Channel Impedance Value: 266 Ohm
Lead Channel Impedance Value: 380 Ohm
Lead Channel Pacing Threshold Amplitude: 2.5 V
Lead Channel Pacing Threshold Pulse Width: 0.4 ms
Lead Channel Sensing Intrinsic Amplitude: 4.625 mV
Lead Channel Sensing Intrinsic Amplitude: 4.625 mV
Lead Channel Setting Pacing Amplitude: 3 V
Lead Channel Setting Pacing Pulse Width: 1 ms
Lead Channel Setting Sensing Sensitivity: 1.2 mV

## 2020-08-07 ENCOUNTER — Ambulatory Visit: Payer: BC Managed Care – PPO | Admitting: Internal Medicine

## 2020-08-07 ENCOUNTER — Encounter: Payer: Self-pay | Admitting: Internal Medicine

## 2020-08-07 ENCOUNTER — Other Ambulatory Visit: Payer: Self-pay

## 2020-08-07 VITALS — BP 126/84 | HR 82 | Temp 97.3°F | Ht 72.0 in | Wt 193.2 lb

## 2020-08-07 DIAGNOSIS — G8929 Other chronic pain: Secondary | ICD-10-CM

## 2020-08-07 DIAGNOSIS — I5032 Chronic diastolic (congestive) heart failure: Secondary | ICD-10-CM | POA: Diagnosis not present

## 2020-08-07 DIAGNOSIS — R600 Localized edema: Secondary | ICD-10-CM

## 2020-08-07 DIAGNOSIS — I4821 Permanent atrial fibrillation: Secondary | ICD-10-CM

## 2020-08-07 DIAGNOSIS — R609 Edema, unspecified: Secondary | ICD-10-CM

## 2020-08-07 DIAGNOSIS — D7589 Other specified diseases of blood and blood-forming organs: Secondary | ICD-10-CM

## 2020-08-07 DIAGNOSIS — M5442 Lumbago with sciatica, left side: Secondary | ICD-10-CM

## 2020-08-07 DIAGNOSIS — Z7901 Long term (current) use of anticoagulants: Secondary | ICD-10-CM

## 2020-08-07 DIAGNOSIS — Z950 Presence of cardiac pacemaker: Secondary | ICD-10-CM

## 2020-08-07 DIAGNOSIS — J479 Bronchiectasis, uncomplicated: Secondary | ICD-10-CM | POA: Diagnosis not present

## 2020-08-07 NOTE — Progress Notes (Signed)
Location:      Place of Service:     Provider:   Code Status:  Goals of Care:  Advanced Directives 07/23/2018  Does Patient Have a Medical Advance Directive? No  Would patient like information on creating a medical advance directive? No - Patient declined     Chief Complaint  Patient presents with  . Medical Management of Chronic Issues    Patient is here today to establish care.    HPI: Patient is a 86 y.o. male seen today for medical management of chronic diseases.   Recent move to [place]  Has history of Multilobar Bronchiectasis, h/o RUL Pancoast Tumor s/p Lobectomy, Follows with Pulmonology History of PAF, S/P PPM On Xarelto also history of Diastolic CHF doing well on Lasix Follows with Cardiology Chronic Back pain Also history of recent T 7 Fracture and old C7 and T2 Fracture On Chronic Opioids Follows with Dr Nelva Bush Mild Cognitive Impairment Per wife but very high functional Still drives Generalized weakness.  Doing well. No Acute complains. Uses Cane sometimes for walking. No Falls just general slowingLives with his wife 2 children son in [place] and daughter in [place]   Past Medical History:  Diagnosis Date  . Abnormal CT of spine 10/24/2018   Per [place] new patient packet  . Atrial fibrillation ([place])   . Back pain    Per [place] new patient packet  . Bradycardia    Per [place] new patient packet  . Bronchiectasis ([place])    Per [place] new patient packet  . COPD, mild ([place])    Per [place] new patient packet  . Hx of colonoscopy 11/14/2007   Per [place] new patient packet  . Hx of CT scan of chest 08/23/2018   Per [place] new patient packet  . Hypothyroidism   . Lung cancer [facility])    Per [place] new patient packet  . Neck pain    Per [place] new patient packet  . 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[facility])    s/p XRT and surgical removal 1990  . Pneumonia 2015  . Symptomatic bradycardia    s/p PPM 1995    Past Surgical History:  Procedure Laterality Date  . CATARACT EXTRACTION,  BILATERAL    . COLONOSCOPY  11/14/2007   Per [place] new patient packet  . LUNG REMOVAL, PARTIAL    . PACEMAKER INSERTION  1995  . PPM GENERATOR CHANGEOUT N/A 07/23/2018   Procedure: PPM GENERATOR CHANGEOUT;  Surgeon: Evans Lance, MD;  Location: [place] [facility];  Service: Cardiovascular;  Laterality: N/A;    No Known Allergies  Outpatient Encounter Medications as of 08/07/2020  Medication Sig  . Cholecalciferol (VITAMIN D) 50 MCG (2000 UT) tablet Take 2,000 Units by mouth daily.  Marland Kitchen docusate sodium (COLACE) 100 MG capsule Take 100 mg by mouth 2 (two) times daily.  . furosemide (LASIX) 40 MG tablet Take 1 tablet by mouth once daily  . HYDROcodone-acetaminophen (NORCO/VICODIN) 5-325 MG tablet Take 1 tablet by mouth 3 (three) times daily as needed for moderate pain.  Marland Kitchen levothyroxine (SYNTHROID, LEVOTHROID) 100 MCG tablet Take 100 mcg by mouth daily before breakfast.   . Magnesium 500 MG CAPS Take by mouth.  . Multiple Vitamin (MULTIVITAMIN) tablet Take 1 tablet by mouth daily.    . psyllium (REGULOID) 0.52 g capsule Take 0.52 g by mouth daily.  . rivaroxaban (XARELTO) 20 MG TABS tablet Take 1 tablet (20 mg total) by mouth daily with supper. Overdue for labwork, MUST have updated  labwork for future refills.  . vitamin C (ASCORBIC ACID) 500 MG tablet Take 500 mg by mouth daily.  . [DISCONTINUED] amoxicillin-clavulanate (AUGMENTIN) 875-125 MG tablet Take 1 tablet by mouth 2 (two) times daily. (Patient not taking: Reported on 07/21/2020)   No facility-administered encounter medications on file as of 08/07/2020.    Review of Systems:  Review of Systems  Constitutional: Positive for activity change.  HENT: Negative.   Respiratory: Negative.   Cardiovascular: Positive for leg swelling.  Gastrointestinal: Positive for constipation.  Genitourinary: Negative.   Musculoskeletal: Positive for arthralgias and back pain.  Skin: Negative.   Neurological: Positive for weakness.   Psychiatric/Behavioral: Negative.     Health Maintenance  Topic Date Due  . TETANUS/TDAP  Never done  . INFLUENZA VACCINE  Completed  . COVID-19 Vaccine  Completed  . PNA vac Low Risk Adult  Completed    Physical Exam: Vitals:   08/07/20 1019  BP: 126/84  Pulse: 82  Temp: (!) 97.3 F (36.3 C)  SpO2: 100%  Weight: 193 lb 3.2 oz (87.6 kg)  Height: 6' (1.829 m)   Body mass index is 26.2 kg/m. Physical Exam Vitals reviewed.  Constitutional:      Appearance: Normal appearance.  HENT:     Head: Normocephalic.     Nose: Nose normal.     Mouth/Throat:     Mouth: Mucous membranes are moist.     Pharynx: Oropharynx is clear.  Eyes:     Pupils: Pupils are equal, round, and reactive to light.  Cardiovascular:     Rate and Rhythm: Normal rate and regular rhythm.     Pulses: Normal pulses.     Heart sounds: Normal heart sounds. No murmur heard.   Pulmonary:     Effort: Pulmonary effort is normal. No respiratory distress.     Breath sounds: Normal breath sounds. No wheezing or rales.  Abdominal:     General: Abdomen is flat. Bowel sounds are normal.     Palpations: Abdomen is soft.  Musculoskeletal:        General: Swelling present.     Cervical back: Neck supple.  Skin:    General: Skin is warm and dry.  Neurological:     General: No focal deficit present.     Mental Status: He is alert and oriented to person, place, and time.  Psychiatric:        Mood and Affect: Mood normal.        Behavior: Behavior normal.        Thought Content: Thought content normal.     Labs reviewed: Basic Metabolic Panel: Recent Labs    07/03/20 1225  NA 134*  K 4.3  CL 99  CO2 26  GLUCOSE 92  BUN 26*  CREATININE 1.43  CALCIUM 9.7   Liver Function Tests: Recent Labs    07/03/20 1225  AST 81*  ALT 52  ALKPHOS 114  BILITOT 0.9  PROT 7.9  ALBUMIN 3.9   No results for input(s): LIPASE, AMYLASE in the last 8760 hours. No results for input(s): AMMONIA in the last 8760  hours. CBC: Recent Labs    07/03/20 1225  WBC 7.5  NEUTROABS 5.3  HGB 12.4*  HCT 36.6*  MCV 102.9*  PLT 195.0   Lipid Panel: No results for input(s): CHOL, HDL, LDLCALC, TRIG, CHOLHDL, LDLDIRECT in the last 8760 hours. No results found for: HGBA1C  Procedures since last visit: CUP [place]  Result Date: 08/05/2020 Scheduled remote  reviewed. Normal device function.  Programmed VVI. Next remote 91 days. Kathy Breach, RN, CCDS, [employer]   Assessment/Plan Bronchiectasis without complication (HCC)and history of lung cancer s/p Lobectomy Was suppose to take Stiolto  inhaler but did not take it as it caused anxiety Does use NS Nebs once a day Has become slower per wife with weakness Referal made for therapy with Pulmonary Rehab  Permanent atrial fibrillation ([place]) Follows with Dr Lovena Le On Xarelto   PPM-[employer]tronic Follows with Dr Lovena Le Chronic diastolic congestive heart failure [place]) Doing well with Lasix Peripheral edema Controlled on Lasix Chronic anticoagulation On Xarelto Chronic left-sided low back pain with left-sided sciatica Follows with Dr Nelva Bush  Pain Controlled with Hydrocodone 3 /day as prescribed by Ortho Also Gets Injections q 3 -4 months Referal Mad to therapy Macrocytosis Repeat CBC  [facility] Also will get Shingrix Will need order for TDAP next visit  Labs/tests ordered:  * No order type specified * Next appt:  Visit date not found

## 2020-08-11 ENCOUNTER — Other Ambulatory Visit: Payer: Self-pay

## 2020-08-11 DIAGNOSIS — I5032 Chronic diastolic (congestive) heart failure: Secondary | ICD-10-CM

## 2020-08-11 DIAGNOSIS — J479 Bronchiectasis, uncomplicated: Secondary | ICD-10-CM

## 2020-08-11 DIAGNOSIS — I4821 Permanent atrial fibrillation: Secondary | ICD-10-CM

## 2020-08-11 DIAGNOSIS — D7589 Other specified diseases of blood and blood-forming organs: Secondary | ICD-10-CM

## 2020-08-12 LAB — CBC
HCT: 36.6 % — ABNORMAL LOW (ref 38.5–50.0)
Hemoglobin: 12.4 g/dL — ABNORMAL LOW (ref 13.2–17.1)
MCH: 34.9 pg — ABNORMAL HIGH (ref 27.0–33.0)
MCHC: 33.9 g/dL (ref 32.0–36.0)
MCV: 103.1 fL — ABNORMAL HIGH (ref 80.0–100.0)
MPV: 10.6 fL (ref 7.5–12.5)
Platelets: 143 10*3/uL (ref 140–400)
RBC: 3.55 10*6/uL — ABNORMAL LOW (ref 4.20–5.80)
RDW: 12.9 % (ref 11.0–15.0)
WBC: 6.3 10*3/uL (ref 3.8–10.8)

## 2020-08-12 LAB — COMPLETE METABOLIC PANEL WITH GFR
AG Ratio: 1.4 (calc) (ref 1.0–2.5)
ALT: 10 U/L (ref 9–46)
AST: 22 U/L (ref 10–35)
Albumin: 4.3 g/dL (ref 3.6–5.1)
Alkaline phosphatase (APISO): 81 U/L (ref 35–144)
BUN/Creatinine Ratio: 17 (calc) (ref 6–22)
BUN: 25 mg/dL (ref 7–25)
CO2: 22 mmol/L (ref 20–32)
Calcium: 9.7 mg/dL (ref 8.6–10.3)
Chloride: 103 mmol/L (ref 98–110)
Creat: 1.45 mg/dL — ABNORMAL HIGH (ref 0.70–1.11)
GFR, Est African American: 49 mL/min/{1.73_m2} — ABNORMAL LOW (ref >=60)
GFR, Est Non African American: 42 mL/min/{1.73_m2} — ABNORMAL LOW (ref >=60)
Globulin: 3.1 g/dL (calc) (ref 1.9–3.7)
Glucose, Bld: 90 mg/dL (ref 65–99)
Potassium: 4.6 mmol/L (ref 3.5–5.3)
Sodium: 140 mmol/L (ref 135–146)
Total Bilirubin: 0.9 mg/dL (ref 0.2–1.2)
Total Protein: 7.4 g/dL (ref 6.1–8.1)

## 2020-08-12 LAB — LIPID PANEL
Cholesterol: 144 mg/dL (ref <200)
HDL: 59 mg/dL (ref >=40)
LDL Cholesterol (Calc): 69 mg/dL (calc)
Non-HDL Cholesterol (Calc): 85 mg/dL (calc) (ref <130)
Total CHOL/HDL Ratio: 2.4 (calc) (ref <5.0)
Triglycerides: 78 mg/dL (ref <150)

## 2020-08-12 LAB — TSH: TSH: 16.47 mIU/L — ABNORMAL HIGH (ref 0.40–4.50)

## 2020-08-12 NOTE — Progress Notes (Signed)
Remote pacemaker transmission.   

## 2020-08-14 LAB — ACID FAST SMEAR+CULTURE W/RFLX
Acid Fast Culture: NEGATIVE
Acid Fast Smear: NEGATIVE

## 2020-08-14 LAB — SPECIMEN STATUS REPORT

## 2020-08-17 ENCOUNTER — Telehealth: Payer: Self-pay | Admitting: *Deleted

## 2020-08-17 DIAGNOSIS — D539 Nutritional anemia, unspecified: Secondary | ICD-10-CM

## 2020-08-17 DIAGNOSIS — E039 Hypothyroidism, unspecified: Secondary | ICD-10-CM

## 2020-08-17 NOTE — Telephone Encounter (Signed)
Patient wife called and stated that patient's TSH is High at 16.47 and wants to know what they need to do.   Stated that patient is taking his Levothyroxine 127mcg daily on a empty stomach 30 minutes before breakfast.   Please Advise.

## 2020-08-18 MED ORDER — LEVOTHYROXINE SODIUM 125 MCG PO TABS: 125.0000 ug | ORAL_TABLET | Freq: Every day | ORAL | 3 refills | Status: DC

## 2020-08-18 NOTE — Telephone Encounter (Signed)
Patient wife called back today and was wondering if Dr. Lyndel Safe had a chance to review the labs. Stated patient has an appointment with his Cardiologist tomorrow and he always reviews his labs, and they want to be able to tell him something. Worried about the TSH being so high.  Please Advise.

## 2020-08-18 NOTE — Telephone Encounter (Addendum)
D/W the wife Will change the Synthroid to 125 mcg. Will repeat TSH in 8 weeks Also Discussed Macrocytosis with mild Anemia ? MDS./ Hypothyroid Will Repeat CBC and TSH in 8 weeks Yavonda Can you call the patient and schedule for repeat Labs in 8 weeks

## 2020-08-18 NOTE — Telephone Encounter (Signed)
DW/Dr. Lyndel Safe. She will give them a call.

## 2020-08-18 NOTE — Addendum Note (Signed)
Addended by: Georgina Snell on: 08/18/2020 10:26 AM   Modules accepted: Orders

## 2020-08-19 ENCOUNTER — Ambulatory Visit: Payer: BC Managed Care – PPO | Admitting: Internal Medicine

## 2020-08-19 ENCOUNTER — Encounter: Payer: Self-pay | Admitting: Internal Medicine

## 2020-08-19 ENCOUNTER — Other Ambulatory Visit: Payer: Self-pay

## 2020-08-19 VITALS — BP 120/78 | HR 58 | Ht 72.0 in | Wt 197.0 lb

## 2020-08-19 DIAGNOSIS — I4821 Permanent atrial fibrillation: Secondary | ICD-10-CM

## 2020-08-19 DIAGNOSIS — I495 Sick sinus syndrome: Secondary | ICD-10-CM | POA: Diagnosis not present

## 2020-08-19 LAB — CUP PACEART INCLINIC DEVICE CHECK
Battery Remaining Longevity: 81 mo
Battery Voltage: 2.99 V
Brady Statistic RV Percent Paced: 78.77 %
Date Time Interrogation Session: 20211215111611
Implantable Lead Implant Date: 19950526
Implantable Lead Implant Date: 19950526
Implantable Lead Location: 753859
Implantable Lead Location: 753860
Implantable Lead Serial Number: 55140
Implantable Lead Serial Number: 55516
Implantable Pulse Generator Implant Date: 20191118
Lead Channel Impedance Value: 285 Ohm
Lead Channel Impedance Value: 380 Ohm
Lead Channel Pacing Threshold Amplitude: 1.5 V
Lead Channel Pacing Threshold Pulse Width: 1 ms
Lead Channel Sensing Intrinsic Amplitude: 5.1 mV
Lead Channel Setting Pacing Amplitude: 3 V
Lead Channel Setting Pacing Pulse Width: 1 ms
Lead Channel Setting Sensing Sensitivity: 1.2 mV

## 2020-08-19 NOTE — Progress Notes (Signed)
HPI [name] returns today for followup. He is a pleasant 86 yo man with a h/o CHB, sinus node dysfnction and atrial fib. He is s/p PPM insertion. He has chronically elevated RV pacing threshold. He has an occluded left subclavian vein. He has remote lung CA. In the interim, he notes some dyspnea with exertion. No chest pain. Mild peripheral edema.  No Known Allergies   Current Outpatient Medications  Medication Sig Dispense Refill  . Cholecalciferol (VITAMIN D) 50 MCG (2000 UT) tablet Take 2,000 Units by mouth daily.    Marland Kitchen docusate sodium (COLACE) 100 MG capsule Take 100 mg by mouth 2 (two) times daily.    . furosemide (LASIX) 40 MG tablet Take 1 tablet by mouth once daily 90 tablet 3  . HYDROcodone-Acetaminophen 10-325 MG/15ML SOLN Take 1 tablet by mouth 3 (three) times daily as needed for moderate pain.     Marland Kitchen levothyroxine (SYNTHROID) 125 MCG tablet Take 1 tablet (125 mcg total) by mouth daily. 90 tablet 3  . Magnesium 500 MG CAPS Take by mouth.    . Multiple Vitamin (MULTIVITAMIN) tablet Take 1 tablet by mouth daily.    . psyllium (REGULOID) 0.52 g capsule Take 0.52 g by mouth daily.    . rivaroxaban (XARELTO) 20 MG TABS tablet Take 1 tablet (20 mg total) by mouth daily with supper. Overdue for labwork, MUST have updated labwork for future refills. 90 tablet 0  . vitamin C (ASCORBIC ACID) 500 MG tablet Take 500 mg by mouth daily.    Marland Kitchen HYDROcodone-acetaminophen (NORCO) 10-325 MG tablet     . sodium chloride HYPERTONIC 3 % nebulizer solution SMARTSIG:Via Inhaler     No current facility-administered medications for this visit.     Past Medical History:  Diagnosis Date  . Abnormal CT of spine 10/24/2018   Per [place] new patient packet  . Atrial fibrillation ([place])   . Back pain    Per [place] new patient packet  . Bradycardia    Per [place] new patient packet  . Bronchiectasis ([place])    Per [place] new patient packet  . COPD, mild ([place])    Per [place] new patient packet  . Hx of  colonoscopy 11/14/2007   Per [place] new patient packet  . Hx of CT scan of chest 08/23/2018   Per [place] new patient packet  . Hypothyroidism   . Lung cancer [facility])    Per [place] new patient packet  . Neck pain    Per [place] new patient packet  . 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[facility])    s/p XRT and surgical removal 1990  . Pneumonia 2015  . Symptomatic bradycardia    s/p PPM 1995    ROS:   All systems reviewed and negative except as noted in the HPI.   Past Surgical History:  Procedure Laterality Date  . CATARACT EXTRACTION, BILATERAL    . COLONOSCOPY  11/14/2007   Per [place] new patient packet  . LUNG REMOVAL, PARTIAL    . PACEMAKER INSERTION  1995  . PPM GENERATOR CHANGEOUT N/A 07/23/2018   Procedure: PPM GENERATOR CHANGEOUT;  Surgeon: Evans Lance, MD;  Location: [place];  Service: Cardiovascular;  Laterality: N/A;     Family History  Problem Relation Age of Onset  . Arrhythmia Father        Atrial fibrillation  . Diabetes Brother        Insulin dependent  . Breast cancer Sister   . Breast cancer  Sister      Social History   Socioeconomic History  . Marital status: Married    Spouse name: Not on file  . Number of children: 2  . Years of education: Not on file  . Highest education level: Not on file  Occupational History  . Occupation: Retired  Tobacco Use  . Smoking status: Former Smoker    Packs/day: 1.00    Years: 40.00    Pack years: 40.00    Types: Cigarettes    Quit date: 09/05/1988    Years since quitting: 31.9  . Smokeless tobacco: Never Used  Vaping Use  . Vaping Use: Never used  Substance and Sexual Activity  . Alcohol use: Yes    Alcohol/week: 7.0 standard drinks    Types: 7 Standard drinks or equivalent per week    Comment: 1 drink daily  . Drug use: No  . Sexual activity: Not on file  Other Topics Concern  . Not on file  Social History Narrative   Diet      Do you drink/eat things with caffeine Yes      Marital Status Married What year were you  married? 1957      Do you live in a house, apartment, assisted living, condo, trailer, etc.? Apartment toenhome      Is it one or more stories? One      How many persons live in your home? 2         Do you have any pets in your home?(please list) No      Highest level of education completed: Bachelors degree      Current or past profession: Psychologist, sport and exercise      Do you exercise?: Some    Type and how often: Various      Do you have a Living Will? No      Do you have a DNR form?         If not, would you like to discuss one?       Do you have signed POA/HPOA forms? No      Do you have difficulty bathing or dressing yourself? No      Do you have difficulty preparing food or eating? No      Do you have difficulty managing medications? No      Do you have difficulty managing your finances? No      Do you have difficulty affording your medications? No                  Social Determinants of Radio broadcast assistant Strain: Not on file  Food Insecurity: Not on file  Transportation Needs: Not on file  Physical Activity: Not on file  Stress: Not on file  Social Connections: Not on file  Intimate Partner Violence: Not on file     BP 120/78   Pulse (!) 58   Ht 6' (1.829 m)   Wt 197 lb (89.4 kg)   SpO2 98%   BMI 26.72 kg/m   Physical Exam:  Well appearing NAD HEENT: Unremarkable Neck:  No JVD, no thyromegally Lymphatics:  No adenopathy Back:  No CVA tenderness Lungs:  Clear with no wheezes HEART:  Regular rate rhythm, no murmurs, no rubs, no clicks Abd:  soft, positive bowel sounds, no organomegally, no rebound, no guarding Ext:  2 plus pulses, no edema, no cyanosis, no clubbing Skin:  No rashes no nodules Neuro:  CN II through XII intact, motor grossly  intact  EKG - atrial fib with ventricular pacing  DEVICE  Normal device function.  See PaceArt for details.   Assess/Plan: 1. Atrial fib - his VR is well controlled.  2. CHB - he has minimal  escape. 3. PPM - he has chronic elevated ventricular pacing threshold.  Carleene Overlie Sian Rockers,MD

## 2020-08-19 NOTE — Patient Instructions (Signed)
Medication Instructions:  None Today *If you need a refill on your cardiac medications before your next appointment, please call your pharmacy*   Lab Work: None Today If you have labs (blood work) drawn today and your tests are completely normal, you will receive your results only by:  [place] (if you have MyChart) OR  A paper copy in the mail If you have any lab test that is abnormal or we need to change your treatment, we will call you to review the results.   Testing/Procedures: None Today   Follow-Up: At [facility], you and your health needs are our priority.  As part of our continuing mission to provide you with exceptional heart care, we have created designated Provider Care Teams.  These Care Teams include your primary Cardiologist (physician) and Advanced Practice Providers (APPs -  Physician Assistants and Nurse Practitioners) who all work together to provide you with the care you need, when you need it.  We recommend signing up for the patient portal called "MyChart".  Sign up information is provided on this After Visit Summary.  MyChart is used to connect with patients for Virtual Visits (Telemedicine).  Patients are able to view lab/test results, encounter notes, upcoming appointments, etc.  Non-urgent messages can be sent to your provider as well.   To learn more about what you can do with MyChart, go to [email or web address].    Your next appointment:   6 month(s)  The format for your next appointment:   In Person  Provider:   [name], MD [facility]   Other Instructions None Today  Thank you for choosing [facility] !

## 2020-08-19 NOTE — Telephone Encounter (Signed)
Lab apnt scheduled.

## 2020-08-20 ENCOUNTER — Telehealth: Payer: Self-pay | Admitting: Pharmacist

## 2020-08-20 MED ORDER — RIVAROXABAN 15 MG PO TABS: 15.0000 mg | ORAL_TABLET | Freq: Every day | ORAL | 1 refills | Status: DC

## 2020-08-20 NOTE — Telephone Encounter (Signed)
Xarelto refill last sent in 06/22/20, pt was overdue for annual lab work to ensure he remained on correct dose. Since then, SCr has been checked twice on 10/29 and 12/7. SCr 1.43 - 1.45 >> CrCl 82mL/min. Pt on incorrect dose of Xarelto, currently taking 20mg  daily but qualifies for 15mg  daily dosing.  I have called pt and advised him of need for Xarelto dose decrease based on renal function. New rx has been sent to pharmacy. Pt and his wife are aware of dose change.

## 2020-09-11 ENCOUNTER — Other Ambulatory Visit: Payer: Self-pay | Admitting: Internal Medicine

## 2020-09-11 DIAGNOSIS — G8929 Other chronic pain: Secondary | ICD-10-CM

## 2020-09-11 DIAGNOSIS — J479 Bronchiectasis, uncomplicated: Secondary | ICD-10-CM

## 2020-09-11 DIAGNOSIS — M5442 Lumbago with sciatica, left side: Secondary | ICD-10-CM

## 2020-09-14 ENCOUNTER — Telehealth: Payer: Self-pay

## 2020-09-14 MED ORDER — FUROSEMIDE 40 MG PO TABS: 40.0000 mg | ORAL_TABLET | Freq: Every day | ORAL | 3 refills | Status: DC

## 2020-09-14 NOTE — Telephone Encounter (Signed)
Patient called device clinic wanting a refill on lasix. I let patient know I will let nurse know since we dont do refills and patient is expecting a call back

## 2020-09-14 NOTE — Telephone Encounter (Signed)
refilled lasix 40 mg qd to [facility] home shipping

## 2020-10-13 ENCOUNTER — Other Ambulatory Visit: Payer: Self-pay

## 2020-10-13 DIAGNOSIS — E039 Hypothyroidism, unspecified: Secondary | ICD-10-CM

## 2020-10-13 DIAGNOSIS — D539 Nutritional anemia, unspecified: Secondary | ICD-10-CM

## 2020-10-14 LAB — CBC WITH DIFFERENTIAL/PLATELET
Absolute Monocytes: 963 cells/uL — ABNORMAL HIGH (ref 200–950)
Basophils Absolute: 50 cells/uL (ref 0–200)
Basophils Relative: 0.9 %
Eosinophils Absolute: 149 cells/uL (ref 15–500)
Eosinophils Relative: 2.7 %
HCT: 39 % (ref 38.5–50.0)
Hemoglobin: 13.1 g/dL — ABNORMAL LOW (ref 13.2–17.1)
Lymphs Abs: 1799 cells/uL (ref 850–3900)
MCH: 34.2 pg — ABNORMAL HIGH (ref 27.0–33.0)
MCHC: 33.6 g/dL (ref 32.0–36.0)
MCV: 101.8 fL — ABNORMAL HIGH (ref 80.0–100.0)
MPV: 10.6 fL (ref 7.5–12.5)
Monocytes Relative: 17.5 %
Neutro Abs: 2541 cells/uL (ref 1500–7800)
Neutrophils Relative %: 46.2 %
Platelets: 126 10*3/uL — ABNORMAL LOW (ref 140–400)
RBC: 3.83 10*6/uL — ABNORMAL LOW (ref 4.20–5.80)
RDW: 12.5 % (ref 11.0–15.0)
Total Lymphocyte: 32.7 %
WBC: 5.5 10*3/uL (ref 3.8–10.8)

## 2020-10-14 LAB — TSH: TSH: 3.19 mIU/L (ref 0.40–4.50)

## 2020-10-14 LAB — VITAMIN B12: Vitamin B-12: 609 pg/mL (ref 200–1100)

## 2020-10-15 ENCOUNTER — Ambulatory Visit: Payer: BC Managed Care – PPO | Admitting: Pulmonary Disease

## 2020-10-15 ENCOUNTER — Encounter: Payer: Self-pay | Admitting: Pulmonary Disease

## 2020-10-15 ENCOUNTER — Other Ambulatory Visit: Payer: Self-pay

## 2020-10-15 VITALS — BP 118/74 | HR 74 | Temp 97.6°F | Ht 72.0 in | Wt 196.6 lb

## 2020-10-15 DIAGNOSIS — J479 Bronchiectasis, uncomplicated: Secondary | ICD-10-CM | POA: Diagnosis not present

## 2020-10-15 DIAGNOSIS — J438 Other emphysema: Secondary | ICD-10-CM | POA: Diagnosis not present

## 2020-10-15 DIAGNOSIS — K219 Gastro-esophageal reflux disease without esophagitis: Secondary | ICD-10-CM | POA: Diagnosis not present

## 2020-10-15 MED ORDER — ALBUTEROL SULFATE (2.5 MG/3ML) 0.083% IN NEBU: 2.5000 mg | INHALATION_SOLUTION | Freq: Four times a day (QID) | RESPIRATORY_TRACT | 0 refills | Status: DC | PRN

## 2020-10-15 NOTE — Patient Instructions (Addendum)
Gad you are doing well  Try the albuterol via the nebulizer to see if it helps with cough at night  I will see you back in 3 months with Dr. Silas Flood

## 2020-10-15 NOTE — Progress Notes (Signed)
Synopsis: Referred in November 2018 for acute ectasis by Robert Dad, MD.  Previously patient of Dr. Lake [name] and Dr. Carlis [name].  Subjective:   PATIENT ID: Robert [name]. GENDER: male DOB: [birth year], MRN: [id number]  Chief Complaint  Patient presents with  . Follow-up    3 month f/u for bronchiectasis. States he has been doing well since last visit.     [name] is an 87 year old gentleman who presents for follow-up.  He is accompanied today by his wife.  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  Doing well today. Exacerbations responded well to Augmentin in past. Has chronic low back pain that limits mobility.PCP adjusted synthroid and TSH better, more energetic per wife. Cough seems better as well, usually at night. HFA albuterla does not seem to help. Using HTS BID. Discussed role of increasing airway clearance during exacerbations.  Reviewed CXR 07/03/20 with RLL large infiltrate on my interpretation.    Past Medical History:  Diagnosis Date  . Abnormal CT of spine 10/24/2018   Per [place] new patient packet  . Atrial fibrillation ([place])   . Back pain    Per [place] new patient packet  . Bradycardia    Per [place] new patient packet  . Bronchiectasis ([place])    Per [place] new patient packet  . COPD, mild ([place])    Per [place] new patient packet  . Hx of colonoscopy 11/14/2007   Per [place] new patient packet  . Hx of CT scan of chest 08/23/2018   Per [place] new patient packet  . Hypothyroidism   . Lung cancer [facility])    Per [place] new patient packet  . Neck pain    Per [place] new patient packet  . 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[facility])    s/p XRT and surgical removal 1990  . Pneumonia 2015  . Symptomatic bradycardia    s/p PPM 1995     Family History  Problem Relation Age of Onset  . Arrhythmia Father        Atrial fibrillation  . Diabetes Brother        Insulin dependent  . Breast cancer Sister   . Breast cancer Sister      Past Surgical History:   Procedure Laterality Date  . CATARACT EXTRACTION, BILATERAL    . COLONOSCOPY  11/14/2007   Per [place] new patient packet  . LUNG REMOVAL, PARTIAL    . PACEMAKER INSERTION  1995  . PPM GENERATOR CHANGEOUT N/A 07/23/2018   Procedure: PPM GENERATOR CHANGEOUT;  Surgeon: Evans Lance, MD;  Location: [place] [facility];  Service: Cardiovascular;  Laterality: N/A;    Social History   Socioeconomic History  . Marital status: Married    Spouse name: Not on file  . Number of children: 2  . Years of education: Not on file  . Highest education level: Not on file  Occupational History  . Occupation: Retired  Tobacco Use  . Smoking status: Former Smoker    Packs/day: 1.00    Years: 40.00    Pack years: 40.00    Types: Cigarettes    Quit date: 09/05/1988    Years since quitting: 32.1  . Smokeless tobacco: Never Used  Vaping Use  . Vaping Use: Never used  Substance and Sexual Activity  . Alcohol use: Yes    Alcohol/week: 7.0 standard drinks    Types: 7 Standard drinks or equivalent per week    Comment: 1 drink  daily  . Drug use: No  . Sexual activity: Not on file  Other Topics Concern  . Not on file  Social History Narrative   Diet      Do you drink/eat things with caffeine Yes      Marital Status Married What year were you married? 1957      Do you live in a house, apartment, assisted living, condo, trailer, etc.? Apartment toenhome      Is it one or more stories? One      How many persons live in your home? 2         Do you have any pets in your home?(please list) No      Highest level of education completed: Bachelors degree      Current or past profession: Psychologist, sport and exercise      Do you exercise?: Some    Type and how often: Various      Do you have a Living Will? No      Do you have a DNR form?         If not, would you like to discuss one?       Do you have signed POA/HPOA forms? No      Do you have difficulty bathing or dressing yourself? No      Do you have  difficulty preparing food or eating? No      Do you have difficulty managing medications? No      Do you have difficulty managing your finances? No      Do you have difficulty affording your medications? No                  Social Determinants of Radio broadcast assistant Strain: Not on file  Food Insecurity: Not on file  Transportation Needs: Not on file  Physical Activity: Not on file  Stress: Not on file  Social Connections: Not on file  Intimate Partner Violence: Not on file     No Known Allergies   Immunization History  Administered Date(s) Administered  . Influenza Split 06/06/2017  . Influenza, High Dose Seasonal PF 07/06/2019, 06/17/2020  . Influenza,inj,quad, With Preservative 06/05/2018  . PFIZER(Purple Top)SARS-COV-2 Vaccination 09/29/2019, 10/21/2019  . Pneumococcal Conjugate-13 06/15/2016  . Pneumococcal-Unspecified 03/11/2019    Outpatient Medications Prior to Visit  Medication Sig Dispense Refill  . Cholecalciferol (VITAMIN D) 50 MCG (2000 UT) tablet Take 2,000 Units by mouth daily.    Marland Kitchen docusate sodium (COLACE) 100 MG capsule Take 100 mg by mouth 2 (two) times daily.    . furosemide (LASIX) 40 MG tablet Take 1 tablet (40 mg total) by mouth daily. 90 tablet 3  . HYDROcodone-acetaminophen (NORCO) 10-325 MG tablet     . levothyroxine (SYNTHROID) 125 MCG tablet Take 1 tablet (125 mcg total) by mouth daily. 90 tablet 3  . Magnesium 500 MG CAPS Take by mouth.    . Multiple Vitamin (MULTIVITAMIN) tablet Take 1 tablet by mouth daily.    . psyllium (REGULOID) 0.52 g capsule Take 0.52 g by mouth daily.    . Rivaroxaban (XARELTO) 15 MG TABS tablet Take 1 tablet (15 mg total) by mouth daily with supper. 90 tablet 1  . sodium chloride HYPERTONIC 3 % nebulizer solution SMARTSIG:Via Inhaler    . vitamin C (ASCORBIC ACID) 500 MG tablet Take 500 mg by mouth daily.    Marland Kitchen HYDROcodone-Acetaminophen 10-325 MG/15ML SOLN Take 1 tablet by mouth 3 (three) times daily as  needed  for moderate pain.      No facility-administered medications prior to visit.      Objective:   Vitals:   10/15/20 0858  BP: 118/74  Pulse: 74  Temp: 97.6 F (36.4 C)  TempSrc: Temporal  SpO2: 98%  Weight: 196 lb 9.6 oz (89.2 kg)  Height: 6' (1.829 m)   98% on  RA BMI Readings from Last 3 Encounters:  10/15/20 26.66 kg/m  08/19/20 26.72 kg/m  08/07/20 26.20 kg/m   Wt Readings from Last 3 Encounters:  10/15/20 196 lb 9.6 oz (89.2 kg)  08/19/20 197 lb (89.4 kg)  08/07/20 193 lb 3.2 oz (87.6 kg)    Physical examination: General: Well-appearing, no acute distress Respiratory: Clear aspiration bilaterally, no crackles or wheeze Cardiovascular: Regular rate and rhythm, no murmurs   CBC    Component Value Date/Time   WBC 5.5 10/13/2020 0700   RBC 3.83 (L) 10/13/2020 0700   HGB 13.1 (L) 10/13/2020 0700   HGB 14.0 07/10/2018 1135   HCT 39.0 10/13/2020 0700   HCT 41.0 07/10/2018 1135   PLT 126 (L) 10/13/2020 0700   PLT 150 07/10/2018 1135   MCV 101.8 (H) 10/13/2020 0700   MCV 97 07/10/2018 1135   MCH 34.2 (H) 10/13/2020 0700   MCHC 33.6 10/13/2020 0700   RDW 12.5 10/13/2020 0700   RDW 12.5 07/10/2018 1135   LYMPHSABS 1,799 10/13/2020 0700   LYMPHSABS 1.3 11/23/2016 1326   MONOABS 1.2 (H) 07/03/2020 1225   EOSABS 149 10/13/2020 0700   EOSABS 0.2 11/23/2016 1326   BASOSABS 50 10/13/2020 0700   BASOSABS 0.0 11/23/2016 1326    Micro: 08/07/2017 AFB sputum-negative 08/07/2017 respiratory-normal flora 06/29/2020: OP flora  Chest Imaging- films reviewed and as per EMR Pulmonary Functions Testing Results: Spirometry 2018 reviewed and interpreted as suggestive of mild restriction vs gas trapping       Assessment & Plan:     ICD-10-CM   1. Bronchiectasis without complication (HCC)  J57.0 albuterol (PROVENTIL) (2.5 MG/3ML) 0.083% nebulizer solution  2. Other emphysema ([place])  J43.8   3. Gastroesophageal reflux disease without esophagitis  K21.9      Multilobar bronchiectasis-possibly related to recurrent aspiration given distribution lower lobes.  Historically responds to Augmentin.  -Continue hypertonic saline nebs twice daily: Stressed importance of increased frequency when feeling ill and on antibiotics -Please avoid ICS given increased risk of infection. 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  Previous tobacco abuse before quitting in 1990. -No concerning symptoms -No longer requires lung cancer screening for previous tobacco use.   RTC in 3 months with Dr. Silas Flood.  MDM: Reviewed prior pulmonary notes Reviewed CXR 06/2020 Reviewed labs 08/2020, 10/2020 New prescription   Current Outpatient Medications:  .  albuterol (PROVENTIL) (2.5 MG/3ML) 0.083% nebulizer solution, Take 3 mLs (2.5 mg total) by nebulization every 6 (six) hours as needed for wheezing or shortness of breath (cough)., Disp: 90 mL, Rfl: 0 .  Cholecalciferol (VITAMIN D) 50 MCG (2000 UT) tablet, Take 2,000 Units by mouth daily., Disp: , Rfl:  .  docusate sodium (COLACE) 100 MG capsule, Take 100 mg by mouth 2 (two) times daily., Disp: , Rfl:  .  furosemide (LASIX) 40  MG tablet, Take 1 tablet (40 mg total) by mouth daily., Disp: 90 tablet, Rfl: 3 .  HYDROcodone-acetaminophen (NORCO) 10-325 MG tablet, , Disp: , Rfl:  .  levothyroxine (SYNTHROID) 125 MCG tablet, Take 1 tablet (125 mcg total) by mouth daily., Disp: 90 tablet, Rfl: 3 .  Magnesium 500 MG CAPS, Take by mouth., Disp: , Rfl:  .  Multiple Vitamin (MULTIVITAMIN) tablet, Take 1 tablet by mouth daily., Disp: , Rfl:  .  psyllium (REGULOID) 0.52 g capsule, Take 0.52 g by mouth daily., Disp:  , Rfl:  .  Rivaroxaban (XARELTO) 15 MG TABS tablet, Take 1 tablet (15 mg total) by mouth daily with supper., Disp: 90 tablet, Rfl: 1 .  sodium chloride HYPERTONIC 3 % nebulizer solution, SMARTSIG:Via Inhaler, Disp: , Rfl:  .  vitamin C (ASCORBIC ACID) 500 MG tablet, Take 500 mg by mouth daily., Disp: , Rfl:

## 2020-11-04 ENCOUNTER — Ambulatory Visit (INDEPENDENT_AMBULATORY_CARE_PROVIDER_SITE_OTHER): Payer: BC Managed Care – PPO

## 2020-11-04 DIAGNOSIS — I495 Sick sinus syndrome: Secondary | ICD-10-CM

## 2020-11-06 LAB — CUP PACEART REMOTE DEVICE CHECK
Battery Remaining Longevity: 68 mo
Battery Voltage: 2.98 V
Brady Statistic RV Percent Paced: 96.06 %
Date Time Interrogation Session: 20220228231222
Implantable Lead Implant Date: 19950526
Implantable Lead Implant Date: 19950526
Implantable Lead Location: 753859
Implantable Lead Location: 753860
Implantable Lead Serial Number: 55140
Implantable Lead Serial Number: 55516
Implantable Pulse Generator Implant Date: 20191118
Lead Channel Impedance Value: 285 Ohm
Lead Channel Impedance Value: 380 Ohm
Lead Channel Pacing Threshold Amplitude: 2.5 V
Lead Channel Pacing Threshold Pulse Width: 0.4 ms
Lead Channel Sensing Intrinsic Amplitude: 6.375 mV
Lead Channel Sensing Intrinsic Amplitude: 6.375 mV
Lead Channel Setting Pacing Amplitude: 3 V
Lead Channel Setting Pacing Pulse Width: 1 ms
Lead Channel Setting Sensing Sensitivity: 1.2 mV

## 2020-11-10 ENCOUNTER — Telehealth: Payer: Self-pay

## 2020-11-10 NOTE — Telephone Encounter (Signed)
The pt states he has received a new app. I called tech support to help the patient with his app. I gave the patient a home remote monitor. They will pick it up at lunch time.

## 2020-11-13 NOTE — Progress Notes (Signed)
Remote pacemaker transmission.   

## 2020-11-19 ENCOUNTER — Other Ambulatory Visit: Payer: Self-pay

## 2020-11-19 ENCOUNTER — Non-Acute Institutional Stay: Payer: BC Managed Care – PPO | Admitting: Nurse Practitioner

## 2020-11-19 ENCOUNTER — Encounter: Payer: Self-pay | Admitting: Nurse Practitioner

## 2020-11-19 DIAGNOSIS — I5032 Chronic diastolic (congestive) heart failure: Secondary | ICD-10-CM

## 2020-11-19 DIAGNOSIS — N1831 Chronic kidney disease, stage 3a: Secondary | ICD-10-CM

## 2020-11-19 DIAGNOSIS — E039 Hypothyroidism, unspecified: Secondary | ICD-10-CM | POA: Insufficient documentation

## 2020-11-19 DIAGNOSIS — N183 Chronic kidney disease, stage 3 unspecified: Secondary | ICD-10-CM | POA: Insufficient documentation

## 2020-11-19 DIAGNOSIS — I4819 Other persistent atrial fibrillation: Secondary | ICD-10-CM

## 2020-11-19 DIAGNOSIS — I5033 Acute on chronic diastolic (congestive) heart failure: Secondary | ICD-10-CM | POA: Insufficient documentation

## 2020-11-19 DIAGNOSIS — J479 Bronchiectasis, uncomplicated: Secondary | ICD-10-CM | POA: Diagnosis not present

## 2020-11-19 DIAGNOSIS — N1832 Chronic kidney disease, stage 3b: Secondary | ICD-10-CM | POA: Insufficient documentation

## 2020-11-19 NOTE — Progress Notes (Signed)
Location:   clinic [place]   Place of Service:  Clinic (12) Provider: Marlana Latus NP  Code Status: DNR Goals of Care: [place] 07/23/2018  Does Patient Have a Medical Advance Directive? No  Would patient like information on creating a medical advance directive? No - Patient declined     Chief Complaint  Patient presents with  . Medical Management of Chronic Issues    4 month follow up.    HPI: Patient is a 87 y.o. male seen today for medical management of chronic diseases.      SSS f/u cardiology, PPM medtronic  Chronic diastolic congestive heart failure/peripheral edema,  takes Furosemide, Bun/creat 25/1.45, eGFR 42 08/11/20  CKD Bun/creat 25/1.45 08/11/20  Bronchiectasis/hx of lung cancer s/p lobectomy , f/u pulmonology, prn Albuterol Neb   Permanent Afib Dr. Lovena Le on Xarelto, Hgb 13.1 10/13/20, LDL 69 08/11/21, TSH 3.19 10/13/20  Hypothyroidism, taking Levothyroxine, TSH 3.15 10/13/20  Chronic left sided lower back pain with left sided sciatica, f/u Dr. Nelva Bush, inj, takes [place] [facility].  shingrix think about. Past Medical History:  Diagnosis Date  . Abnormal CT of spine 10/24/2018   Per [place] new patient packet  . Atrial fibrillation ([place])   . Back pain    Per [place] new patient packet  . Bradycardia    Per [place] new patient packet  . Bronchiectasis ([place])    Per [place] new patient packet  . COPD, mild ([place])    Per [place] new patient packet  . Hx of colonoscopy 11/14/2007   Per [place] new patient packet  . Hx of CT scan of chest 08/23/2018   Per [place] new patient packet  . Hypothyroidism   . Lung cancer [facility])    Per [place] new patient packet  . Neck pain    Per [place] new patient packet  . 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[facility])    s/p XRT and surgical removal 1990  . Pneumonia 2015  . Symptomatic bradycardia    s/p PPM 1995    Past Surgical History:  Procedure Laterality Date  . CATARACT EXTRACTION, BILATERAL    . COLONOSCOPY  11/14/2007   Per [place] new patient packet   . LUNG REMOVAL, PARTIAL    . PACEMAKER INSERTION  1995  . PPM GENERATOR CHANGEOUT N/A 07/23/2018   Procedure: PPM GENERATOR CHANGEOUT;  Surgeon: Evans Lance, MD;  Location: [place] [facility];  Service: Cardiovascular;  Laterality: N/A;    No Known Allergies  Allergies as of 11/19/2020   No Known Allergies     Medication List       Accurate as of November 19, 2020 11:59 PM. If you have any questions, ask your nurse or doctor.        albuterol (2.5 MG/3ML) 0.083% nebulizer solution Commonly known as: PROVENTIL Take 3 mLs (2.5 mg total) by nebulization every 6 (six) hours as needed for wheezing or shortness of breath (cough).   docusate sodium 100 MG capsule Commonly known as: COLACE Take 100 mg by mouth 2 (two) times daily.   furosemide 40 MG tablet Commonly known as: LASIX Take 1 tablet (40 mg total) by mouth daily.   HYDROcodone-acetaminophen 10-325 MG tablet Commonly known as: NORCO 3 (three) times daily as needed.   levothyroxine 125 MCG tablet Commonly known as: SYNTHROID Take 1 tablet (125 mcg total) by mouth daily.   Magnesium 500 MG Caps Take by mouth.   multivitamin tablet Take 1 tablet by mouth  daily.   psyllium 0.52 g capsule Commonly known as: REGULOID Take 0.52 g by mouth daily.   Rivaroxaban 15 MG Tabs tablet Commonly known as: Xarelto Take 1 tablet (15 mg total) by mouth daily with supper.   sodium chloride HYPERTONIC 3 % nebulizer solution SMARTSIG:Via Inhaler   vitamin C 500 MG tablet Commonly known as: ASCORBIC ACID Take 500 mg by mouth daily.   Vitamin D 50 MCG (2000 UT) tablet Take 2,000 Units by mouth daily.       Review of Systems:  Review of Systems  Constitutional: Negative for activity change, appetite change, fatigue, fever and unexpected weight change.  HENT: Negative for congestion, hearing loss and voice change.   Respiratory: Negative for cough, shortness of breath and wheezing.   Cardiovascular: Positive for leg  swelling. Negative for chest pain and palpitations.  Gastrointestinal: Positive for constipation. Negative for abdominal pain, nausea and vomiting.       Occasionally  Genitourinary: Negative for difficulty urinating, dysuria, frequency and urgency.       Night 1-2x  Musculoskeletal: Positive for arthralgias. Negative for gait problem.  Skin: Negative for color change.  Neurological: Negative for speech difficulty, weakness, light-headedness and headaches.  Psychiatric/Behavioral: Negative for behavioral problems, hallucinations and sleep disturbance. The patient is not nervous/anxious.     Health Maintenance  Topic Date Due  . TETANUS/TDAP  Never done  . COVID-19 Vaccine (3 - Booster for Pfizer series) 04/19/2020  . INFLUENZA VACCINE  Completed  . PNA vac Low Risk Adult  Completed  . HPV VACCINES  Aged Out    Physical Exam: Vitals:   11/19/20 1413  BP: 124/78  Pulse: 75  Temp: (!) 97.3 F (36.3 C)  SpO2: 97%  Weight: 197 lb 3.2 oz (89.4 kg)  Height: 6' (1.829 m)   Body mass index is 26.75 kg/m. Physical Exam Vitals and nursing note reviewed.  Constitutional:      Appearance: Normal appearance.  HENT:     Head: Normocephalic and atraumatic.     Right Ear: Tympanic membrane normal.     Left Ear: Tympanic membrane normal.     Nose: Nose normal.     Mouth/Throat:     Mouth: Mucous membranes are moist.  Eyes:     Extraocular Movements: Extraocular movements intact.     Conjunctiva/sclera: Conjunctivae normal.     Pupils: Pupils are equal, round, and reactive to light.  Cardiovascular:     Rate and Rhythm: Normal rate and regular rhythm.     Heart sounds: No murmur heard.     Comments: Pacemaker.  Pulmonary:     Effort: Pulmonary effort is normal.     Breath sounds: Rales present. No wheezing or rhonchi.     Comments: Posterior right upper and mid lungs.  Abdominal:     General: Bowel sounds are normal.     Palpations: Abdomen is soft.     Tenderness: There is  no right CVA tenderness, left CVA tenderness, guarding or rebound.  Musculoskeletal:     Cervical back: Normal range of motion and neck supple.     Right lower leg: Edema present.     Left lower leg: Edema present.     Comments: trace  Skin:    General: Skin is warm and dry.  Neurological:     General: No focal deficit present.     Mental Status: He is alert and oriented to person, place, and time. Mental status is at baseline.       Motor: No weakness.     Coordination: Coordination normal.     Gait: Gait normal.  Psychiatric:        Mood and Affect: Mood normal.        Behavior: Behavior normal.        Thought Content: Thought content normal.     Labs reviewed: Basic Metabolic Panel: Recent Labs    07/03/20 1225 08/11/20 0700 10/13/20 0700  NA 134* 140  --   K 4.3 4.6  --   CL 99 103  --   CO2 26 22  --   GLUCOSE 92 90  --   BUN 26* 25  --   CREATININE 1.43 1.45*  --   CALCIUM 9.7 9.7  --   TSH  --  16.47* 3.19   Liver Function Tests: Recent Labs    07/03/20 1225 08/11/20 0700  AST 81* 22  ALT 52 10  ALKPHOS 114  --   BILITOT 0.9 0.9  PROT 7.9 7.4  ALBUMIN 3.9  --    No results for input(s): LIPASE, AMYLASE in the last 8760 hours. No results for input(s): AMMONIA in the last 8760 hours. CBC: Recent Labs    07/03/20 1225 08/11/20 0700 10/13/20 0700  WBC 7.5 6.3 5.5  NEUTROABS 5.3  --  2,541  HGB 12.4* 12.4* 13.1*  HCT 36.6* 36.6* 39.0  MCV 102.9* 103.1* 101.8*  PLT 195.0 143 126*   Lipid Panel: Recent Labs    08/11/20 0700  CHOL 144  HDL 59  LDLCALC 69  TRIG 78  CHOLHDL 2.4   No results found for: HGBA1C  Procedures since last visit: CUP PACEART REMOTE DEVICE CHECK  Result Date: 11/06/2020 Scheduled remote reviewed. Normal device function.  Next remote 91 days. HB   Assessment/Plan  Atrial fibrillation Permanent Afib Dr. Lovena Le on Xarelto, Hgb 13.1 10/13/20, LDL 69 08/11/21, TSH 3.19 10/13/20   Hypothyroidism,  unspecified Hypothyroidism, taking Levothyroxine, TSH 3.15 10/13/20  Bronchiectasis without complication ([place]) Bronchiectasis/hx of lung cancer s/p lobectomy , f/u pulmonology, prn Albuterol Neb   Kidney disease, chronic, stage III (moderate, EGFR 30-59 ml/min) (HCC) Bun/creat 25/1.45 08/11/20   Chronic diastolic heart failure (HCC) Chronic diastolic congestive heart failure/peripheral edema,  takes Furosemide, Bun/creat 25/1.45, eGFR 42 08/11/20    Labs/tests ordered:  Wants labs at cardiology.   Next appt:  6 months.

## 2020-11-19 NOTE — Assessment & Plan Note (Signed)
Hypothyroidism, taking Levothyroxine, TSH 3.15 10/13/20

## 2020-11-19 NOTE — Assessment & Plan Note (Signed)
Chronic diastolic congestive heart failure/peripheral edema,  takes Furosemide, Bun/creat 25/1.45, eGFR 42 08/11/20

## 2020-11-19 NOTE — Assessment & Plan Note (Signed)
Permanent Afib Dr. Lovena Le on Xarelto, Hgb 13.1 10/13/20, LDL 69 08/11/21, TSH 3.19 10/13/20

## 2020-11-19 NOTE — Assessment & Plan Note (Signed)
Bun/creat 25/1.45 08/11/20

## 2020-11-19 NOTE — Assessment & Plan Note (Signed)
Bronchiectasis/hx of lung cancer s/p lobectomy , f/u pulmonology, prn Albuterol Neb

## 2020-11-20 ENCOUNTER — Encounter: Payer: Self-pay | Admitting: Nurse Practitioner

## 2020-12-03 ENCOUNTER — Telehealth: Payer: Self-pay | Admitting: Pulmonary Disease

## 2020-12-03 IMAGING — CT CT L SPINE W/O CM
3 of 4 series · 12 of 33 positions shown, 14 images · non-contrast
Comparison: None.

CLINICAL DATA: Recent severe left low back, left flank, and left
lower extremity pain. Improved symptoms with medical therapy.

EXAM:
CT LUMBAR SPINE WITHOUT CONTRAST
TECHNIQUE: Multidetector CT imaging of the lumbar spine was performed without
intravenous contrast administration. Multiplanar CT image
reconstructions were also generated.

[Series 4: l-spine 2.00 br40 s3 lspine st · axial · 0.37mm/px · z∈[+1375,+1541]mm · 4 of 125 slices shown, 5 images]
[im 21/125  soft-tissue]
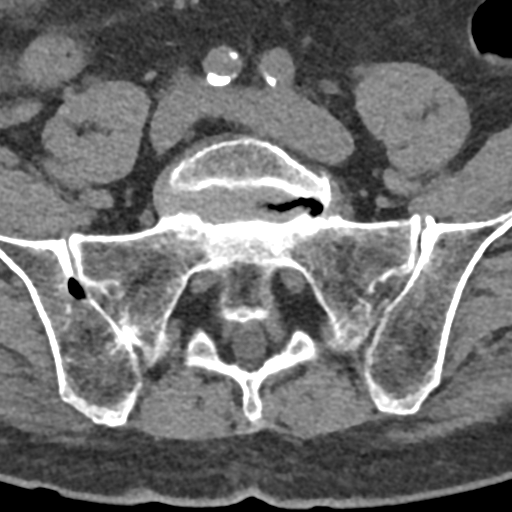
[im 21/125  bone]
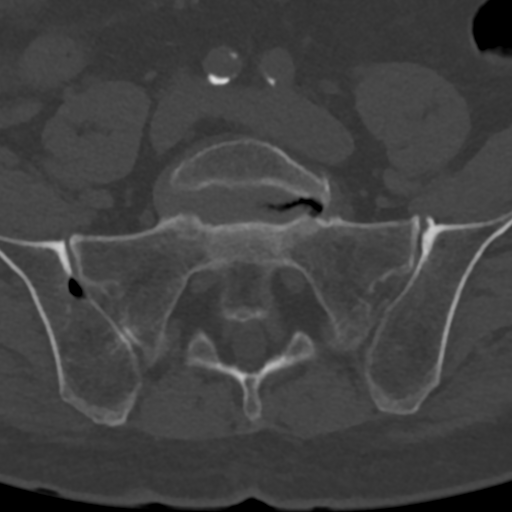
[im 42/125  bone]
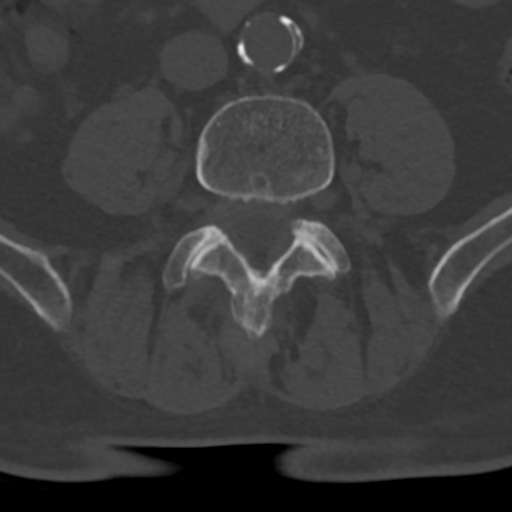
[im 83/125  bone]
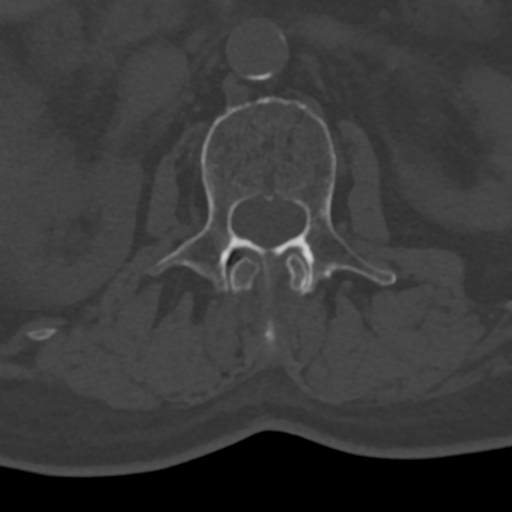
[im 104/125  bone]
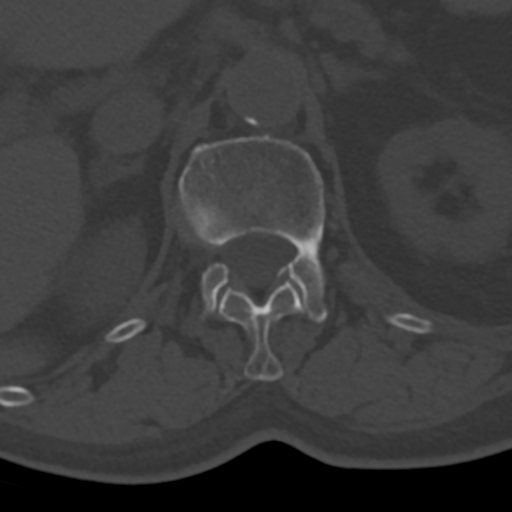

[Series 6: l-spine 2.00 br60 s3 sag sag bone · sagittal · 0.33mm/px · 5 of 96 slices shown, 6 images]
[im 32/96  bone]
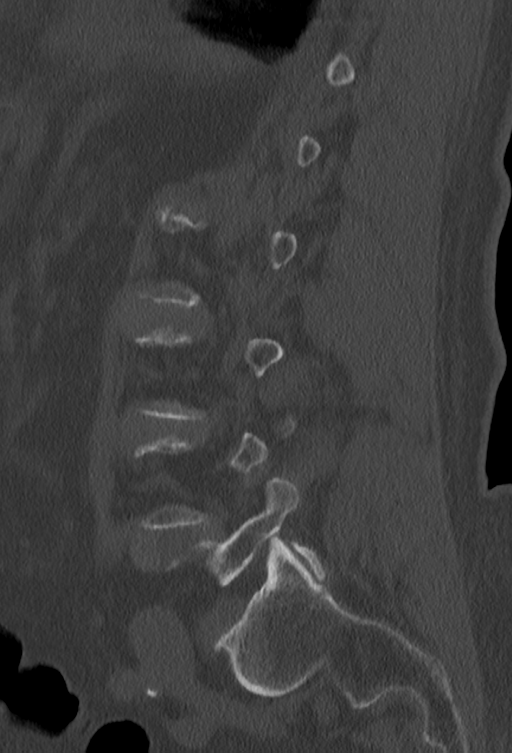
[im 40/96  bone]
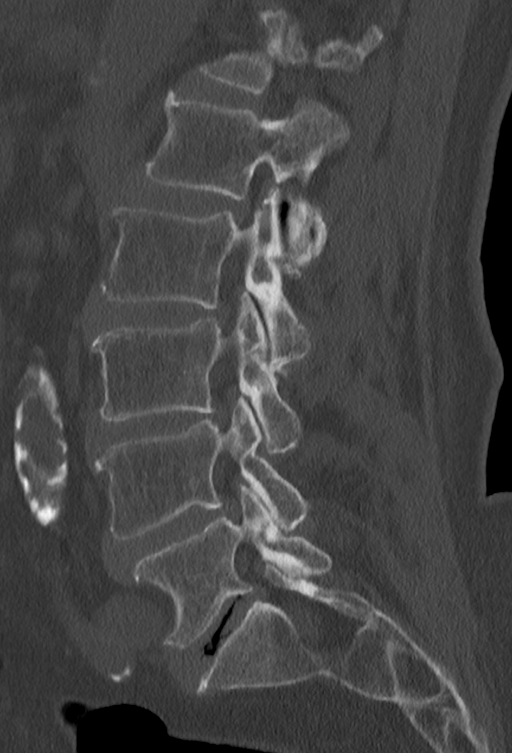
[im 48/96  soft-tissue]
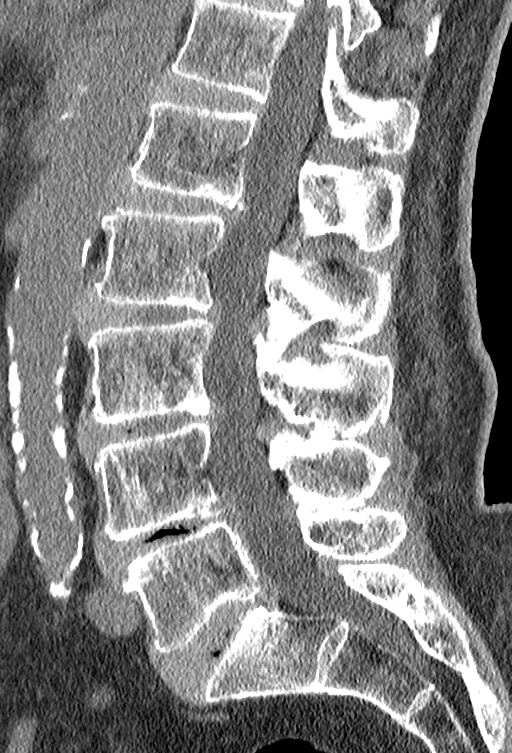
[im 48/96  bone]
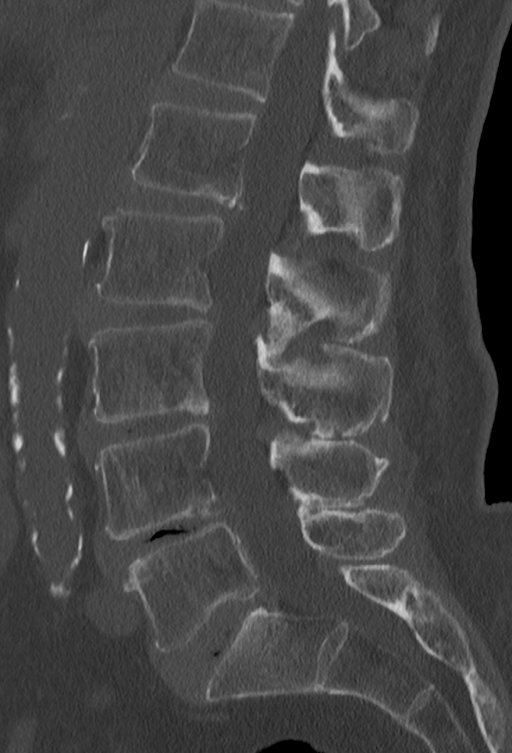
[im 56/96  bone]
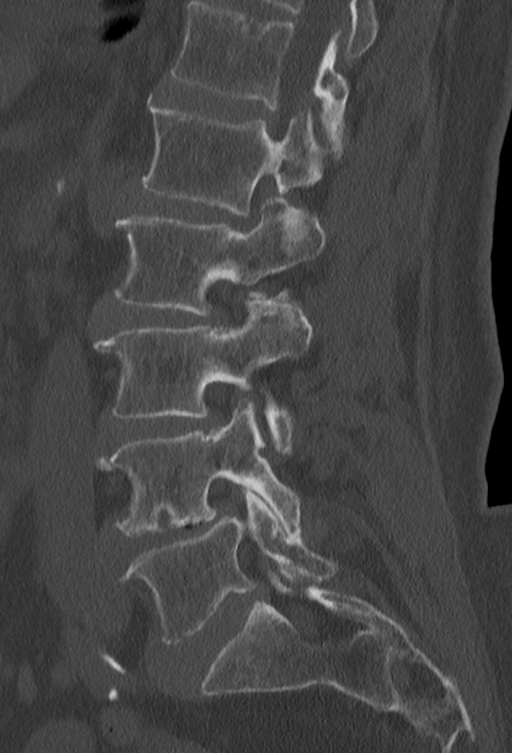
[im 64/96  bone]
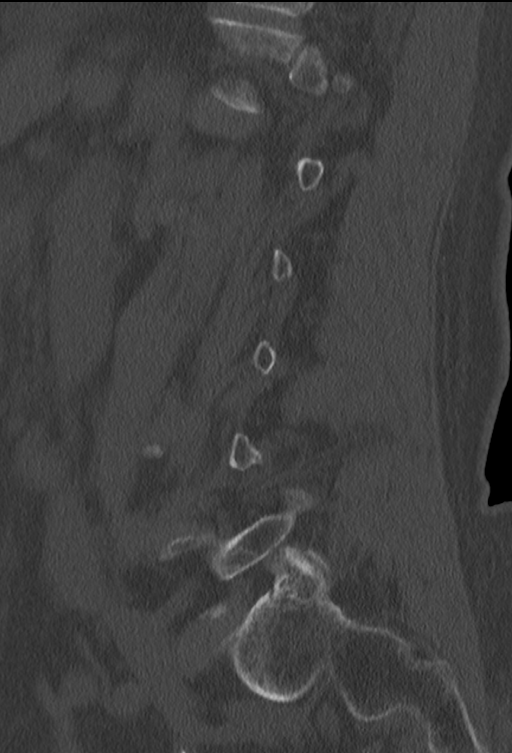

[Series 8: l-spine 2.00 br60 s3 cor cor bone · coronal · 0.38mm/px · 3 of 85 slices shown]
[im 17/85  bone]
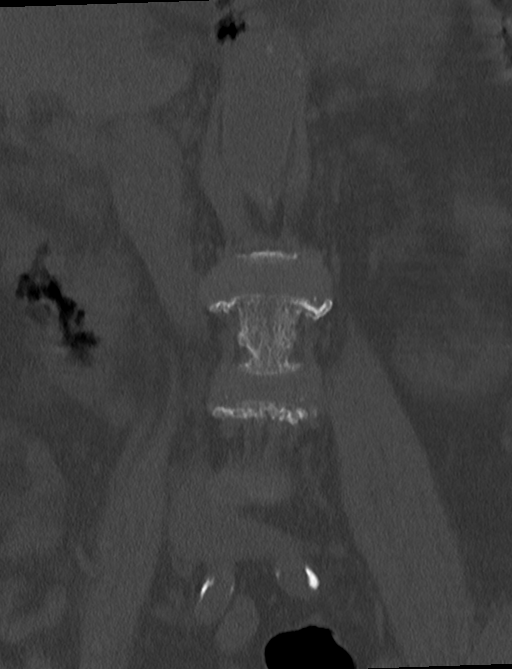
[im 34/85  bone]
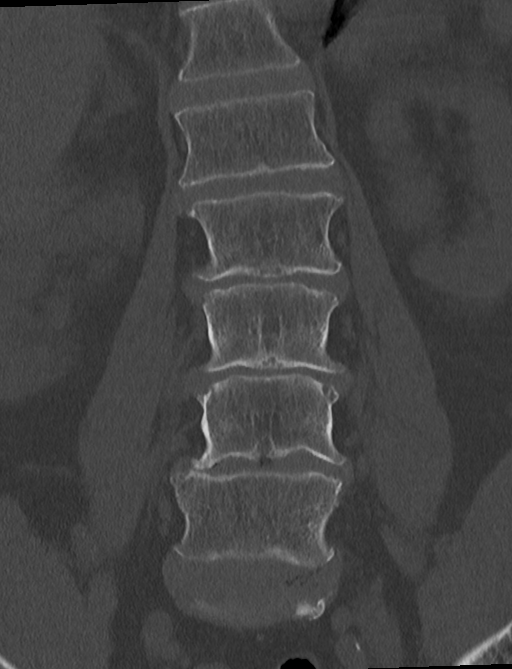
[im 51/85  bone]
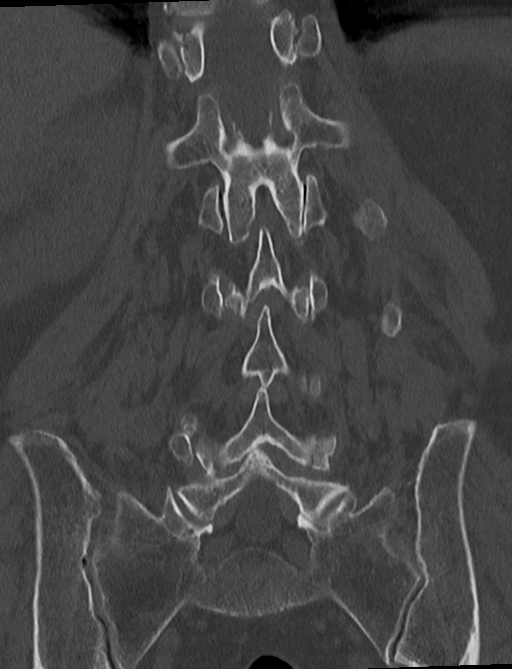

[12 of 33 positions shown; findings below may reference images not displayed]

FINDINGS: Segmentation: 5 lumbar type vertebrae.

Alignment: Mild lumbar levoscoliosis. 3 mm retrolisthesis of L1 on
L2.

Vertebrae: Multiple Schmorl's nodes most notably involving the T12
superior endplate and L3 and L4 inferior endplates. No fracture or
suspicious osseous lesion.

Paraspinal and other soft tissues: Small left pleural effusion.
Aortic atherosclerosis. 1.4 cm hypoattenuating right adrenal nodule
compatible with adenoma.

Disc levels:

Moderate disc space narrowing from L2-3 to L4-5. Vacuum disc at L4-5
and L5-S1.

L1-2: Retrolisthesis, disc bulging, and mild facet and ligamentum
flavum hypertrophy result in bilateral lateral recess stenosis
without significant generalized spinal or neural foraminal stenosis.

L2-3: Disc bulging and moderate facet and ligamentum flavum
hypertrophy result in mild spinal stenosis, bilateral lateral recess
stenosis, and borderline to mild bilateral neural foraminal
stenosis.

L3-4: Disc bulging and mild-to-moderate facet and ligamentum flavum
hypertrophy result in mild spinal stenosis, bilateral lateral recess
stenosis, mild bilateral neural foraminal stenosis.

L4-5: Disc bulging and moderate facet and ligamentum flavum
hypertrophy result in mild spinal stenosis, bilateral lateral recess
stenosis, and mild right greater than left neural foraminal
stenosis.

L5-S1: A shallow left central disc protrusion is in close proximity
to the S1 nerve roots without evidence of significant lateral recess
or spinal stenosis. Disc bulging results in mild bilateral neural
foraminal stenosis.
IMPRESSION: 1. No acute osseous abnormality identified in the lumbar spine.
2. Multilevel disc and facet degeneration with mild spinal and
neural foraminal stenosis as above.
3. Small left central disc protrusion at L5-S1 without associated
stenosis.
4. Small left pleural effusion.
5. 1.4 cm right adrenal adenoma.
6.  Aortic Atherosclerosis ([id number]).

## 2020-12-03 MED ORDER — PREDNISONE 10 MG PO TABS: ORAL_TABLET | ORAL | 0 refills | Status: AC

## 2020-12-03 MED ORDER — AMOXICILLIN-POT CLAVULANATE 875-125 MG PO TABS: 1.0000 | ORAL_TABLET | Freq: Two times a day (BID) | ORAL | 0 refills | Status: DC

## 2020-12-03 NOTE — Telephone Encounter (Signed)
Primary Pulmonologist: Dr Silas Flood Last office visit and with whom: 10/15/2020 with Dr Silas Flood What do we see them for (pulmonary problems): Bronchiectasis without complication Last OV assessment/plan:  1. Bronchiectasis without complication (HCC)  Z12.4 albuterol (PROVENTIL) (2.5 MG/3ML) 0.083% nebulizer solution  2. Other emphysema ([place])  J43.8   3. Gastroesophageal reflux disease without esophagitis  K21.9     Multilobar bronchiectasis-possibly related to recurrent aspiration given distribution lower lobes.  Historically responds to Augmentin.  -Continue hypertonic saline nebs twice daily: Stressed importance of increased frequency when feeling ill and on antibiotics -Please avoid ICS given increased risk of infection. 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  Previous tobacco abuse before quitting in 1990. -No concerning symptoms -No longer requires lung cancer screening for previous tobacco use.   RTC in 3 months with Dr. Silas Flood.  Was appointment offered to patient (explain)?  No   Reason for call:  Called and spoke with patient who stated he started having a productive cough with yellowish-brownish phlegm on Tuesday. Denies any shortness of breath or fever. Does have some wheezing. Has been using his nebulizer medications. Never started using stiolto. Patient states he notice things get worse when he lays down. Patient states he has not had to use O2.  Dr Chase Caller can you please advise since Dr Silas Flood is not available.   No Known Allergies  Immunization History  Administered Date(s) Administered  . Influenza Split 06/06/2017  . Influenza,  High Dose Seasonal PF 07/06/2019, 06/17/2020  . Influenza,inj,quad, With Preservative 06/05/2018  . PFIZER(Purple Top)SARS-COV-2 Vaccination 09/29/2019, 10/21/2019  . Pneumococcal Conjugate-13 06/15/2016  . Pneumococcal-Unspecified 03/11/2019

## 2020-12-03 NOTE — Telephone Encounter (Signed)
Called and spoke with pt's spouse Romie Minus letting her know the info stated by MR and let her know that we were going to send Rx for prednisone and augmentin to pharmacy for pt. Romie Minus verbalized understanding. Verified preferred pharmacy and sent meds in for pt. Nothing further needed.

## 2020-12-03 NOTE — Telephone Encounter (Signed)
Increase hypertonic saline nebulizer to 3-4 times a day Can do Augmentin 875 mg twice daily [you to check with him if this is the dose that he has done in the past] x 5 day  -Monitor for diarrhea  Please take prednisone 40 mg x1 day, then 30 mg x1 day, then 20 mg x1 day, then 10 mg x1 day, and then 5 mg x1 day and stop  Go to ER if worse

## 2021-01-13 ENCOUNTER — Encounter: Payer: Self-pay | Admitting: Pulmonary Disease

## 2021-01-13 ENCOUNTER — Ambulatory Visit: Payer: BC Managed Care – PPO | Admitting: Pulmonary Disease

## 2021-01-13 ENCOUNTER — Other Ambulatory Visit: Payer: Self-pay

## 2021-01-13 VITALS — BP 128/82 | HR 74 | Temp 98.2°F | Ht 72.0 in | Wt 195.6 lb

## 2021-01-13 DIAGNOSIS — J449 Chronic obstructive pulmonary disease, unspecified: Secondary | ICD-10-CM

## 2021-01-13 DIAGNOSIS — J479 Bronchiectasis, uncomplicated: Secondary | ICD-10-CM | POA: Diagnosis not present

## 2021-01-13 DIAGNOSIS — J471 Bronchiectasis with (acute) exacerbation: Secondary | ICD-10-CM | POA: Diagnosis not present

## 2021-01-13 MED ORDER — ALBUTEROL SULFATE (2.5 MG/3ML) 0.083% IN NEBU
2.5000 mg | INHALATION_SOLUTION | Freq: Four times a day (QID) | RESPIRATORY_TRACT | 11 refills | Status: DC | PRN
Start: 2021-01-13 — End: 2021-11-16

## 2021-01-13 MED ORDER — TRELEGY ELLIPTA 100-62.5-25 MCG/INH IN AEPB: 1.0000 | INHALATION_SPRAY | Freq: Every day | RESPIRATORY_TRACT | 0 refills | Status: DC

## 2021-01-13 MED ORDER — AMOXICILLIN-POT CLAVULANATE 875-125 MG PO TABS: 1.0000 | ORAL_TABLET | Freq: Two times a day (BID) | ORAL | 0 refills | Status: DC

## 2021-01-13 MED ORDER — PREDNISONE 10 MG PO TABS: 20.0000 mg | ORAL_TABLET | Freq: Every day | ORAL | 0 refills | Status: AC

## 2021-01-13 NOTE — Progress Notes (Signed)
Patient seen in the office today and instructed on use of Trelegy 165mcg.  Patient expressed understanding and demonstrated technique.  Benetta Spar [facility] 01/13/2021

## 2021-01-13 NOTE — Addendum Note (Signed)
Addended by: Valerie Salts on: 01/13/2021 02:27 PM   Modules accepted: Orders

## 2021-01-13 NOTE — Progress Notes (Signed)
Synopsis: Referred in November 2018 for bronchiectasis by Wardell Honour, MD.  Previously patient of Dr. Lake Bells and Dr. Carlis Abbott.  Subjective:   PATIENT ID: Robert [name]. GENDER: male DOB: [birth year], MRN: [id number]  Chief Complaint  Patient presents with  . Follow-up    3 mo f/u. States his breathing has been stable since last visit. Productive cough with yellowish, greenish phlegm. SOB has remained stable. Denies any chest pain.     Robert [name] is an 87 year old gentleman who presents for follow-up.  He is accompanied today by his wife.  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  Doing well today. Treated for bronchiectasis exacerbation 12/03/20.  Had worsening sputum production, minimal respiratory symptoms otherwise.  Received 5 days of Augmentin a few days of steroid taper with vast improvement in symptoms.  He has worsening cough really only at night when he lies supine.  Sometimes coughing fits to the point of turning blue.  During the day while upright does not have similar symptoms.  Notes that the cough seems much worse over the last few weeks in the setting of increased pollen.  It seems he never really started using Stiolto, is using albuterol.  He was using it as needed but now using it regularly twice a day for symptoms described above.  Reviewed CXR 07/03/20 with RLL large infiltrate on my interpretation.    Past Medical History:  Diagnosis Date  . Abnormal CT of spine 10/24/2018   Per [place] new patient packet  . Atrial fibrillation ([place])   . Back pain    Per [place] new patient packet  . Bradycardia    Per [place] new patient packet  . Bronchiectasis ([place])    Per [place] new patient packet  . COPD, mild ([place])    Per [place] new patient packet  . Hx of colonoscopy 11/14/2007   Per [place] new patient packet  . Hx of CT scan of chest 08/23/2018   Per [place] new patient packet  . Hypothyroidism   . Lung cancer [facility])    Per [place] new  patient packet  . Neck pain    Per [place] new patient packet  . 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[facility])    s/p XRT and surgical removal 1990  . Pneumonia 2015  . Symptomatic bradycardia    s/p PPM 1995     Family History  Problem Relation Age of Onset  . Arrhythmia Father        Atrial fibrillation  . Diabetes Brother        Insulin dependent  . Breast cancer Sister   . Breast cancer Sister      Past Surgical History:  Procedure Laterality Date  . CATARACT EXTRACTION, BILATERAL    . COLONOSCOPY  11/14/2007   Per [place] new patient packet  . LUNG REMOVAL, PARTIAL    . PACEMAKER INSERTION  1995  . PPM GENERATOR CHANGEOUT N/A 07/23/2018   Procedure: PPM GENERATOR CHANGEOUT;  Surgeon: Evans Lance, MD;  Location: [facility];  Service: Cardiovascular;  Laterality: N/A;    Social History   Socioeconomic History  . Marital status: Married    Spouse name: Not on file  . Number of children: 2  . Years of education: Not on file  . Highest education level: Not on file  Occupational History  . Occupation: Retired  Tobacco Use  . Smoking status: Former Smoker    Packs/day: 1.00  Years: 40.00    Pack years: 40.00    Types: Cigarettes    Quit date: 09/05/1988    Years since quitting: 32.3  . Smokeless tobacco: Never Used  Vaping Use  . Vaping Use: Never used  Substance and Sexual Activity  . Alcohol use: Yes    Alcohol/week: 7.0 standard drinks    Types: 7 Standard drinks or equivalent per week    Comment: 1 drink daily  . Drug use: No  . Sexual activity: Not on file  Other Topics Concern  . Not on file  Social History Narrative   Diet      Do you drink/eat things with caffeine Yes      Marital Status Married What year were you married? 1957      Do you live in a house, apartment, assisted living, condo, trailer, etc.? Apartment toenhome      Is it one or more stories? One      How many persons live in your home? 2         Do you have any pets in your home?(please list)  No      Highest level of education completed: Bachelors degree      Current or past profession: Psychologist, sport and exercise      Do you exercise?: Some    Type and how often: Various      Do you have a Living Will? No      Do you have a DNR form?         If not, would you like to discuss one?       Do you have signed POA/HPOA forms? No      Do you have difficulty bathing or dressing yourself? No      Do you have difficulty preparing food or eating? No      Do you have difficulty managing medications? No      Do you have difficulty managing your finances? No      Do you have difficulty affording your medications? No                  Social Determinants of Radio broadcast assistant Strain: Not on file  Food Insecurity: Not on file  Transportation Needs: Not on file  Physical Activity: Not on file  Stress: Not on file  Social Connections: Not on file  Intimate Partner Violence: Not on file     No Known Allergies   Immunization History  Administered Date(s) Administered  . Influenza Split 06/06/2017  . Influenza, High Dose Seasonal PF 07/06/2019, 06/17/2020  . Influenza,inj,quad, With Preservative 06/05/2018  . PFIZER(Purple Top)SARS-COV-2 Vaccination 09/29/2019, 10/21/2019  . Pneumococcal Conjugate-13 06/15/2016  . Pneumococcal-Unspecified 03/11/2019    Outpatient Medications Prior to Visit  Medication Sig Dispense Refill  . Cholecalciferol (VITAMIN D) 50 MCG (2000 UT) tablet Take 2,000 Units by mouth daily.    Marland Kitchen docusate sodium (COLACE) 100 MG capsule Take 100 mg by mouth 2 (two) times daily.    . furosemide (LASIX) 40 MG tablet Take 1 tablet (40 mg total) by mouth daily. 90 tablet 3  . HYDROcodone-acetaminophen (NORCO) 10-325 MG tablet 3 (three) times daily as needed.    Marland Kitchen levothyroxine (SYNTHROID) 125 MCG tablet Take 1 tablet (125 mcg total) by mouth daily. 90 tablet 3  . Magnesium 500 MG CAPS Take by mouth.    . Multiple Vitamin (MULTIVITAMIN) tablet Take 1 tablet by  mouth daily.    . psyllium (REGULOID)  0.52 g capsule Take 0.52 g by mouth daily.    . Rivaroxaban (XARELTO) 15 MG TABS tablet Take 1 tablet (15 mg total) by mouth daily with supper. 90 tablet 1  . sodium chloride HYPERTONIC 3 % nebulizer solution SMARTSIG:Via Inhaler    . vitamin C (ASCORBIC ACID) 500 MG tablet Take 500 mg by mouth daily.    Marland Kitchen albuterol (PROVENTIL) (2.5 MG/3ML) 0.083% nebulizer solution Take 3 mLs (2.5 mg total) by nebulization every 6 (six) hours as needed for wheezing or shortness of breath (cough). 90 mL 0  . amoxicillin-clavulanate (AUGMENTIN) 875-125 MG tablet Take 1 tablet by mouth 2 (two) times daily. 10 tablet 0   No facility-administered medications prior to visit.      Objective:   Vitals:   01/13/21 1346  BP: 128/82  Pulse: 74  Temp: 98.2 F (36.8 C)  TempSrc: Temporal  SpO2: 98%  Weight: 195 lb 9.6 oz (88.7 kg)  Height: 6' (1.829 m)   98% on  RA BMI Readings from Last 3 Encounters:  01/13/21 26.53 kg/m  11/19/20 26.75 kg/m  10/15/20 26.66 kg/m   Wt Readings from Last 3 Encounters:  01/13/21 195 lb 9.6 oz (88.7 kg)  11/19/20 197 lb 3.2 oz (89.4 kg)  10/15/20 196 lb 9.6 oz (89.2 kg)    Physical examination: General: Well-appearing, no acute distress Respiratory: Clear aspiration bilaterally, no crackles or wheeze Cardiovascular: Regular rate and rhythm, no murmurs   CBC    Component Value Date/Time   WBC 5.5 10/13/2020 0700   RBC 3.83 (L) 10/13/2020 0700   HGB 13.1 (L) 10/13/2020 0700   HGB 14.0 07/10/2018 1135   HCT 39.0 10/13/2020 0700   HCT 41.0 07/10/2018 1135   PLT 126 (L) 10/13/2020 0700   PLT 150 07/10/2018 1135   MCV 101.8 (H) 10/13/2020 0700   MCV 97 07/10/2018 1135   MCH 34.2 (H) 10/13/2020 0700   MCHC 33.6 10/13/2020 0700   RDW 12.5 10/13/2020 0700   RDW 12.5 07/10/2018 1135   LYMPHSABS 1,799 10/13/2020 0700   LYMPHSABS 1.3 11/23/2016 1326   MONOABS 1.2 (H) 07/03/2020 1225   EOSABS 149 10/13/2020 0700   EOSABS  0.2 11/23/2016 1326   BASOSABS 50 10/13/2020 0700   BASOSABS 0.0 11/23/2016 1326    Micro: 08/07/2017 AFB sputum-negative 08/07/2017 respiratory-normal flora 06/29/2020: OP flora  Chest Imaging- films reviewed and as per EMR Pulmonary Functions Testing Results: Spirometry 2018 reviewed and interpreted as suggestive of mild restriction vs gas trapping      Assessment & Plan:     ICD-10-CM   1. Bronchiectasis with (acute) exacerbation (HCC)  J47.1 predniSONE (DELTASONE) 10 MG tablet    amoxicillin-clavulanate (AUGMENTIN) 875-125 MG tablet  2. Chronic obstructive pulmonary disease, unspecified COPD type (HCC)  J44.9 predniSONE (DELTASONE) 10 MG tablet    amoxicillin-clavulanate (AUGMENTIN) 875-125 MG tablet  3. Bronchiectasis without complication (HCC)  G95.6 albuterol (PROVENTIL) (2.5 MG/3ML) 0.083% nebulizer solution    Multilobar bronchiectasis-possibly related to recurrent aspiration given distribution lower lobes.  Recently treated for exacerbation but truly a presumed aspiration pneumonia given right lower lobe infiltrate with Augmentin and great improvement in acute symptoms. 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 -Trelegy as above given multiple steroid courses for breathing  History of lung cancer- RUL pancoast tumor.  Previous tobacco abuse before quitting in 1990. -No concerning symptoms -No longer requires lung cancer screening for previous tobacco use.   RTC in 3  months with Dr. Silas Flood.  Lanier Clam, MD

## 2021-01-13 NOTE — Patient Instructions (Addendum)
Nice to see you again  I provided a course of prednisone and Augmentin for you to have on hand if your cough and shortness of breath were to worsen.  Please call the office and let us know when you start this medication if you need to.  I worry some of your worsening symptoms with the pollen could be related to something like asthma, may be made worse by your history of smoking in the past.  For this reason, I recommend starting a new inhaler called Trelegy.  Use 1 puff once a day.  Please rinse your mouth out after every use.  If the sample help, let me know and I will send in a new prescription for this.  I refilled the albuterol nebulizer solution.  Return to clinic in 3 months or sooner as needed with Dr. Silas Flood

## 2021-01-20 ENCOUNTER — Telehealth: Payer: Self-pay | Admitting: Pulmonary Disease

## 2021-01-20 NOTE — Telephone Encounter (Signed)
Called and spoke with pt's wife Romie Minus letting her know that it would be okay for pt to take the prednisone while on Trelegy and she verbalized understanding.  Romie Minus said that Dr. Silas Flood wanted to be made aware if pt did have to begin the prednisone due to the Rx being sent in for pt to use prn and she said that pt is probably going to be beginning the prednisone tonight 5/18.   Romie Minus states that pt has been coughing a lot getting up clear phlegm but has had some yellow tinge at times.  States that he also is wheezing, has no energy due to the amount of coughing he has been doing and also due to not able to get any sleep as the cough is worse at night.  Pt had not had any fever as last temp was 97.6.  Sending to Dr. Silas Flood as an Juluis Rainier that pt is probably going to be beginning the prednisone tonight 5/18. Nothing further needed.

## 2021-01-30 ENCOUNTER — Other Ambulatory Visit: Payer: Self-pay | Admitting: Internal Medicine

## 2021-02-02 NOTE — Telephone Encounter (Signed)
Pt's age 87, wt 88.7 kg, SCr 1.45, CrCl 42.48, last ov w/ GT 08/19/20.

## 2021-02-03 ENCOUNTER — Ambulatory Visit (INDEPENDENT_AMBULATORY_CARE_PROVIDER_SITE_OTHER): Payer: BC Managed Care – PPO

## 2021-02-03 DIAGNOSIS — I495 Sick sinus syndrome: Secondary | ICD-10-CM | POA: Diagnosis not present

## 2021-02-04 LAB — CUP PACEART REMOTE DEVICE CHECK
Battery Remaining Longevity: 64 mo
Battery Voltage: 2.97 V
Brady Statistic RV Percent Paced: 95.9 %
Date Time Interrogation Session: 20220601030239
Implantable Lead Implant Date: 19950526
Implantable Lead Implant Date: 19950526
Implantable Lead Location: 753859
Implantable Lead Location: 753860
Implantable Lead Serial Number: 55140
Implantable Lead Serial Number: 55516
Implantable Pulse Generator Implant Date: 20191118
Lead Channel Impedance Value: 285 Ohm
Lead Channel Impedance Value: 380 Ohm
Lead Channel Pacing Threshold Amplitude: 2.375 V
Lead Channel Pacing Threshold Pulse Width: 0.4 ms
Lead Channel Sensing Intrinsic Amplitude: 6.125 mV
Lead Channel Sensing Intrinsic Amplitude: 6.125 mV
Lead Channel Setting Pacing Amplitude: 3 V
Lead Channel Setting Pacing Pulse Width: 1 ms
Lead Channel Setting Sensing Sensitivity: 1.2 mV

## 2021-02-09 ENCOUNTER — Other Ambulatory Visit: Payer: Self-pay

## 2021-02-09 ENCOUNTER — Ambulatory Visit: Payer: BC Managed Care – PPO | Admitting: Internal Medicine

## 2021-02-09 ENCOUNTER — Encounter: Payer: Self-pay | Admitting: Internal Medicine

## 2021-02-09 VITALS — BP 112/74 | HR 79 | Ht 72.0 in | Wt 188.0 lb

## 2021-02-09 DIAGNOSIS — I495 Sick sinus syndrome: Secondary | ICD-10-CM | POA: Diagnosis not present

## 2021-02-09 DIAGNOSIS — Z950 Presence of cardiac pacemaker: Secondary | ICD-10-CM

## 2021-02-09 DIAGNOSIS — I482 Chronic atrial fibrillation, unspecified: Secondary | ICD-10-CM | POA: Diagnosis not present

## 2021-02-09 NOTE — Patient Instructions (Signed)
Medication Instructions:  Your physician recommends that you continue on your current medications as directed. Please refer to the Current Medication list given to you today.  Labwork: None ordered.  Testing/Procedures: None ordered.  Follow-Up: Your physician wants you to follow-up in: one year with [name], MD or one of the following Advanced Practice Providers on your designated Care Team:    Chanetta Marshall, NP  Tommye Standard, PA-C  Legrand Como "Jonni Sanger" [place], [place]  Remote monitoring is used to monitor your Pacemaker from home. This monitoring reduces the number of office visits required to check your device to one time per year. It allows [place] to keep an eye on the functioning of your device to ensure it is working properly. You are scheduled for a device check from home on 05/05/2021. You may send your transmission at any time that day. If you have a wireless device, the transmission will be sent automatically. After your physician reviews your transmission, you will receive a postcard with your next transmission date.  Any Other Special Instructions Will Be Listed Below (If Applicable).  If you need a refill on your cardiac medications before your next appointment, please call your pharmacy.

## 2021-02-09 NOTE — Progress Notes (Signed)
HPI Robert [name] returns today for followup. He is a pleasant 87 yo man with a h/o CHB, sinus node dysfnction and atrial fib. He is s/p PPM insertion. He has chronically elevated RV pacing threshold. He has an occluded left subclavian vein. He has remote lung CA. In the interim, he notes some dyspnea with exertion. No chest pain. Mild peripheral edema. He had no trouble healing after his PM gen change out. He has class 2 CHF symptoms.  No Known Allergies   Current Outpatient Medications  Medication Sig Dispense Refill  . albuterol (PROVENTIL) (2.5 MG/3ML) 0.083% nebulizer solution Take 3 mLs (2.5 mg total) by nebulization every 6 (six) hours as needed for wheezing or shortness of breath (cough). 90 mL 11  . Cholecalciferol (VITAMIN D) 50 MCG (2000 UT) tablet Take 2,000 Units by mouth daily.    Marland Kitchen docusate sodium (COLACE) 100 MG capsule Take 100 mg by mouth 2 (two) times daily.    . furosemide (LASIX) 40 MG tablet Take 1 tablet (40 mg total) by mouth daily. 90 tablet 3  . HYDROcodone-acetaminophen (NORCO) 10-325 MG tablet 3 (three) times daily as needed.    Marland Kitchen levothyroxine (SYNTHROID) 125 MCG tablet Take 1 tablet (125 mcg total) by mouth daily. 90 tablet 3  . Magnesium 500 MG CAPS Take by mouth.    . Multiple Vitamin (MULTIVITAMIN) tablet Take 1 tablet by mouth daily.    . psyllium (REGULOID) 0.52 g capsule Take 0.52 g by mouth daily.    . sodium chloride HYPERTONIC 3 % nebulizer solution SMARTSIG:Via Inhaler    . vitamin C (ASCORBIC ACID) 500 MG tablet Take 500 mg by mouth daily.    Alveda Reasons 15 MG TABS tablet TAKE 1 TABLET BY MOUTH EVERY DAY WITH SUPPER 90 tablet 1   No current facility-administered medications for this visit.     Past Medical History:  Diagnosis Date  . Abnormal CT of spine 10/24/2018   Per [place] new patient packet  . Atrial fibrillation ([place])   . Back pain    Per [place] new patient packet  . Bradycardia    Per [place] new patient packet  . Bronchiectasis ([place])     Per [place] new patient packet  . COPD, mild ([place])    Per [place] new patient packet  . Hx of colonoscopy 11/14/2007   Per [place] new patient packet  . Hx of CT scan of chest 08/23/2018   Per [place] new patient packet  . Hypothyroidism   . Lung cancer [facility])    Per [place] new patient packet  . Neck pain    Per [place] new patient packet  . 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[facility])    s/p XRT and surgical removal 1990  . Pneumonia 2015  . Symptomatic bradycardia    s/p PPM 1995    ROS:   All systems reviewed and negative except as noted in the HPI.   Past Surgical History:  Procedure Laterality Date  . CATARACT EXTRACTION, BILATERAL    . COLONOSCOPY  11/14/2007   Per [place] new patient packet  . LUNG REMOVAL, PARTIAL    . PACEMAKER INSERTION  1995  . PPM GENERATOR CHANGEOUT N/A 07/23/2018   Procedure: PPM GENERATOR CHANGEOUT;  Surgeon: Evans Lance, MD;  Location: [facility];  Service: Cardiovascular;  Laterality: N/A;     Family History  Problem Relation Age of Onset  . Arrhythmia Father        Atrial fibrillation  .  Diabetes Brother        Insulin dependent  . Breast cancer Sister   . Breast cancer Sister      Social History   Socioeconomic History  . Marital status: Married    Spouse name: Not on file  . Number of children: 2  . Years of education: Not on file  . Highest education level: Not on file  Occupational History  . Occupation: Retired  Tobacco Use  . Smoking status: Former Smoker    Packs/day: 1.00    Years: 40.00    Pack years: 40.00    Types: Cigarettes    Quit date: 09/05/1988    Years since quitting: 32.4  . Smokeless tobacco: Never Used  Vaping Use  . Vaping Use: Never used  Substance and Sexual Activity  . Alcohol use: Yes    Alcohol/week: 7.0 standard drinks    Types: 7 Standard drinks or equivalent per week    Comment: 1 drink daily  . Drug use: No  . Sexual activity: Not on file  Other Topics Concern  . Not on file  Social History Narrative   Diet       Do you drink/eat things with caffeine Yes      Marital Status Married What year were you married? 1957      Do you live in a house, apartment, assisted living, condo, trailer, etc.? Apartment toenhome      Is it one or more stories? One      How many persons live in your home? 2         Do you have any pets in your home?(please list) No      Highest level of education completed: Bachelors degree      Current or past profession: Psychologist, sport and exercise      Do you exercise?: Some    Type and how often: Various      Do you have a Living Will? No      Do you have a DNR form?         If not, would you like to discuss one?       Do you have signed POA/HPOA forms? No      Do you have difficulty bathing or dressing yourself? No      Do you have difficulty preparing food or eating? No      Do you have difficulty managing medications? No      Do you have difficulty managing your finances? No      Do you have difficulty affording your medications? No                  Social Determinants of Radio broadcast assistant Strain: Not on file  Food Insecurity: Not on file  Transportation Needs: Not on file  Physical Activity: Not on file  Stress: Not on file  Social Connections: Not on file  Intimate Partner Violence: Not on file     BP 112/74   Pulse 79   Ht 6' (1.829 m)   Wt 188 lb (85.3 kg)   SpO2 98%   BMI 25.50 kg/m   Physical Exam:  Well appearing NAD HEENT: Unremarkable Neck:  No JVD, no thyromegally Lymphatics:  No adenopathy Back:  No CVA tenderness Lungs:  Clear with no wheezes; decreased breath sounds in the right upper airways. HEART:  Regular rate rhythm, no murmurs, no rubs, no clicks Abd:  soft, positive bowel sounds, no organomegally, no rebound,  no guarding Ext:  2 plus pulses, no edema, no cyanosis, no clubbing Skin:  No rashes no nodules Neuro:  CN II through XII intact, motor grossly intact  DEVICE  Normal device function.  See PaceArt for details.    Assess/Plan: 1. Atrial fib - his VR is well controlled. He is tolerating xarelto for stroke prevention. 2. CHB - he has minimal escape. He is asymptomatic, s/p PPM insertion. 3. PPM - he has chronic elevated ventricular pacing threshold but it remains stable. His battery longevity is over 5 years. 4. Diastolic heart failure - I reminded he and his wife of the importance of a low sodium diet.He will continue lasix.   Carleene Overlie Leomar Westberg,MD

## 2021-02-26 NOTE — Progress Notes (Signed)
Remote pacemaker transmission.   

## 2021-04-16 ENCOUNTER — Telehealth: Payer: Medicare Other

## 2021-04-16 NOTE — Telephone Encounter (Signed)
[name]  Incoming call received from patients spouse to ask if Dr.Miller will approve/fill out a form for patient to get a handicap placard. Reason for request patient is limited in his mobility and not able to walk far without becoming fatigued.  Dr.Miller please advise, if you agree to request I will fill out handicap placard and have [name] or [name] sign  Patients last appointment was with [facility] on 11/19/2020 and pending appointment is with Dr.Miller on 05/26/2021  Thanks, S.Chrae B/CMA

## 2021-04-19 NOTE — Telephone Encounter (Signed)
Mrs.Coleman returned call earlier and stated she would like the medical assistant to bring the paperwork to the Thursday clinic and she will stop by and pick it up.   Form was placed on Dr.Miller's desk for completion/signature and then to be given to Lesly Rubenstein to make a copy for scanning and to take the original to [facility] on Thursday 04/22/2021

## 2021-04-19 NOTE — Telephone Encounter (Signed)
Message was sent by Dr.Miller via a routing comment:  You 12 hours ago (8:25 PM)   Okay to complete handicap sticker   Left message on voicemail for patient to return call when available , reason for call: give patient a status update in letting him know that [place] agreed to request and to confirm how patient would like to retrieve handicap placard once Dr.Miller signs tomorrow.  Awaiting return call.

## 2021-04-21 NOTE — Telephone Encounter (Signed)
I checked with Lesly Rubenstein and she has the placard to take to the facility tomorrow

## 2021-05-04 ENCOUNTER — Ambulatory Visit: Payer: BC Managed Care – PPO | Admitting: Pulmonary Disease

## 2021-05-05 ENCOUNTER — Ambulatory Visit (INDEPENDENT_AMBULATORY_CARE_PROVIDER_SITE_OTHER): Payer: BC Managed Care – PPO

## 2021-05-05 DIAGNOSIS — I495 Sick sinus syndrome: Secondary | ICD-10-CM

## 2021-05-11 ENCOUNTER — Telehealth: Payer: Self-pay

## 2021-05-11 ENCOUNTER — Other Ambulatory Visit: Payer: Self-pay | Admitting: Family Medicine

## 2021-05-11 DIAGNOSIS — N1831 Chronic kidney disease, stage 3a: Secondary | ICD-10-CM

## 2021-05-11 LAB — CUP PACEART REMOTE DEVICE CHECK
Battery Remaining Longevity: 75 mo
Battery Voltage: 2.97 V
Brady Statistic RV Percent Paced: 96.53 %
Date Time Interrogation Session: 20220831063902
Implantable Lead Implant Date: 19950526
Implantable Lead Implant Date: 19950526
Implantable Lead Location: 753859
Implantable Lead Location: 753860
Implantable Lead Serial Number: 55140
Implantable Lead Serial Number: 55516
Implantable Pulse Generator Implant Date: 20191118
Lead Channel Impedance Value: 266 Ohm
Lead Channel Impedance Value: 361 Ohm
Lead Channel Pacing Threshold Amplitude: 2.375 V
Lead Channel Pacing Threshold Pulse Width: 0.4 ms
Lead Channel Sensing Intrinsic Amplitude: 6.125 mV
Lead Channel Sensing Intrinsic Amplitude: 6.125 mV
Lead Channel Setting Pacing Amplitude: 2.5 V
Lead Channel Setting Pacing Pulse Width: 1 ms
Lead Channel Setting Sensing Sensitivity: 1.2 mV

## 2021-05-11 NOTE — Progress Notes (Signed)
Appt soon ; labs ordered C/W medical issues managed

## 2021-05-11 NOTE — Telephone Encounter (Signed)
Patient's wife called stating they would like for patient to get updated labs done before his 6 month follow up with Dr. Sabra Heck on 09/21. Lab app made for 09/20, needing labs dropped for patient. Please advise. Thank you. Message routed to Dr. Sabra Heck.

## 2021-05-12 ENCOUNTER — Other Ambulatory Visit: Payer: Self-pay | Admitting: Family Medicine

## 2021-05-17 ENCOUNTER — Encounter: Payer: Self-pay | Admitting: Pulmonary Disease

## 2021-05-17 ENCOUNTER — Ambulatory Visit: Payer: BC Managed Care – PPO | Admitting: Pulmonary Disease

## 2021-05-17 ENCOUNTER — Other Ambulatory Visit: Payer: Self-pay

## 2021-05-17 VITALS — BP 114/70 | HR 86 | Temp 97.5°F | Ht 72.0 in | Wt 177.4 lb

## 2021-05-17 DIAGNOSIS — J479 Bronchiectasis, uncomplicated: Secondary | ICD-10-CM

## 2021-05-17 MED ORDER — PREDNISONE 20 MG PO TABS: 20.0000 mg | ORAL_TABLET | Freq: Every day | ORAL | 0 refills | Status: DC

## 2021-05-17 MED ORDER — ALBUTEROL SULFATE HFA 108 (90 BASE) MCG/ACT IN AERS: 2.0000 | INHALATION_SPRAY | Freq: Four times a day (QID) | RESPIRATORY_TRACT | 6 refills | Status: AC | PRN

## 2021-05-17 MED ORDER — AMOXICILLIN-POT CLAVULANATE 875-125 MG PO TABS: 1.0000 | ORAL_TABLET | Freq: Two times a day (BID) | ORAL | 0 refills | Status: AC

## 2021-05-17 NOTE — Patient Instructions (Signed)
Nice to see you again  I refilled the albuterol and 5 day supply of Augmentin and prednisone to have on hand.  Return to clinic in 6 months or sooner as needed

## 2021-05-17 NOTE — Progress Notes (Signed)
Synopsis: Referred in November 2018 for bronchiectasis by Wardell Honour, MD.  Previously patient of Dr. Lake Bells and Dr. Carlis Abbott.  Subjective:   PATIENT ID: Robert [name]. GENDER: male DOB: [birth year], MRN: [id number]  Chief Complaint  Patient presents with   Follow-up    Pt states no concerns.    Robert [name] is 87 y.o. gentleman who presents for follow-up.  He is accompanied today by his wife.  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  Doing well today. Treated for bronchiectasis exacerbation 5/22.  Had worsening sputum production, minimal respiratory symptoms otherwise.  Received 5 days of Augmentin a 5 days of prednisone 20 mg daily with vast improvement in symptoms.  He is doing ok otherwise. No complaints. Stopped Trelegy as did not find very helpful.  Reviewed CXR 07/03/20 with RLL large infiltrate on my interpretation.    Past Medical History:  Diagnosis Date   Abnormal CT of spine 10/24/2018   Per [facility] new patient packet   Atrial fibrillation ([place])    Back pain    Per [place] new patient packet   Bradycardia    Per [facility] new patient packet   Bronchiectasis [facility])    Per [place] new patient packet   COPD, mild ([place])    Per [place] new patient packet   Hx of colonoscopy 11/14/2007   Per [place] new patient packet   Hx of CT scan of chest 08/23/2018   Per [place] new patient packet   Hypothyroidism    Lung cancer [facility])    Per [place] new patient packet   Neck pain    Per [place] new patient packet   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[facility])    s/p XRT and surgical removal 1990   Pneumonia 2015   Symptomatic bradycardia    s/p PPM 1995     Family History  Problem Relation Age of Onset   Arrhythmia Father        Atrial fibrillation   Diabetes Brother        Insulin dependent   Breast cancer Sister    Breast cancer Sister      Past Surgical History:  Procedure Laterality Date   CATARACT EXTRACTION, BILATERAL     COLONOSCOPY  11/14/2007   Per [place]  new patient packet   LUNG REMOVAL, PARTIAL     PACEMAKER [place] N/A 07/23/2018   Procedure: PPM GENERATOR CHANGEOUT;  Surgeon: Evans Lance, MD;  Location: [place] [facility];  Service: Cardiovascular;  Laterality: N/A;    Social History   Socioeconomic History   Marital status: Married    Spouse name: Not on file   Number of children: 2   Years of education: Not on file   Highest education level: Not on file  Occupational History   Occupation: Retired  Tobacco Use   Smoking status: Former    Packs/day: 1.00    Years: 40.00    Pack years: 40.00    Types: Cigarettes    Quit date: 09/05/1988    Years since quitting: 32.7   Smokeless tobacco: Never  Vaping Use   Vaping Use: Never used  Substance and Sexual Activity   Alcohol use: Yes    Alcohol/week: 7.0 standard drinks    Types: 7 Standard drinks or equivalent per week    Comment: 1 drink daily   Drug use: No   Sexual activity: Not on file  Other  Topics Concern   Not on file  Social History Narrative   Diet      Do you drink/eat things with caffeine Yes      Marital Status Married What year were you married? 1957      Do you live in a house, apartment, assisted living, condo, trailer, etc.? Apartment toenhome      Is it one or more stories? One      How many persons live in your home? 2         Do you have any pets in your home?(please list) No      Highest level of education completed: Bachelors degree      Current or past profession: Psychologist, sport and exercise      Do you exercise?: Some    Type and how often: Various      Do you have a Living Will? No      Do you have a DNR form?         If not, would you like to discuss one?       Do you have signed POA/HPOA forms? No      Do you have difficulty bathing or dressing yourself? No      Do you have difficulty preparing food or eating? No      Do you have difficulty managing medications? No      Do you have difficulty managing  your finances? No      Do you have difficulty affording your medications? No                  Social Determinants of Health   Financial Resource Strain: Not on file  Food Insecurity: Not on file  Transportation Needs: Not on file  Physical Activity: Not on file  Stress: Not on file  Social Connections: Not on file  Intimate Partner Violence: Not on file     No Known Allergies   Immunization History  Administered Date(s) Administered   Influenza Split 06/06/2017   Influenza, High Dose Seasonal PF 07/06/2019, 06/17/2020   Influenza,inj,quad, With Preservative 06/05/2018   PFIZER(Purple Top)SARS-COV-2 Vaccination 09/29/2019, 10/21/2019   Pneumococcal Conjugate-13 06/15/2016   Pneumococcal-Unspecified 03/11/2019    Outpatient Medications Prior to Visit  Medication Sig Dispense Refill   albuterol (PROVENTIL) (2.5 MG/3ML) 0.083% nebulizer solution Take 3 mLs (2.5 mg total) by nebulization every 6 (six) hours as needed for wheezing or shortness of breath (cough). 90 mL 11   Cholecalciferol (VITAMIN D) 50 MCG (2000 UT) tablet Take 2,000 Units by mouth daily.     docusate sodium (COLACE) 100 MG capsule Take 100 mg by mouth 2 (two) times daily.     furosemide (LASIX) 40 MG tablet Take 1 tablet (40 mg total) by mouth daily. 90 tablet 3   HYDROcodone-acetaminophen (NORCO) 10-325 MG tablet 3 (three) times daily as needed.     levothyroxine (SYNTHROID) 125 MCG tablet Take 1 tablet (125 mcg total) by mouth daily. 90 tablet 3   Magnesium 500 MG CAPS Take by mouth.     Multiple Vitamin (MULTIVITAMIN) tablet Take 1 tablet by mouth daily.     psyllium (REGULOID) 0.52 g capsule Take 0.52 g by mouth daily.     sodium chloride HYPERTONIC 3 % nebulizer solution SMARTSIG:Via Inhaler     vitamin C (ASCORBIC ACID) 500 MG tablet Take 500 mg by mouth daily.     XARELTO 15 MG TABS tablet TAKE 1 TABLET BY MOUTH EVERY DAY WITH SUPPER 90  tablet 1   No facility-administered medications prior to visit.       Objective:   Vitals:   05/17/21 1355  BP: 114/70  Pulse: 86  Temp: (!) 97.5 F (36.4 C)  TempSrc: Oral  SpO2: 94%  Weight: 177 lb 6.4 oz (80.5 kg)  Height: 6' (1.829 m)   94% on  RA BMI Readings from Last 3 Encounters:  05/17/21 24.06 kg/m  02/09/21 25.50 kg/m  01/13/21 26.53 kg/m   Wt Readings from Last 3 Encounters:  05/17/21 177 lb 6.4 oz (80.5 kg)  02/09/21 188 lb (85.3 kg)  01/13/21 195 lb 9.6 oz (88.7 kg)    Physical examination: General: Well-appearing, no acute distress Respiratory: Clear aspiration bilaterally, no crackles or wheeze Cardiovascular: Regular rate and rhythm, no murmurs   CBC    Component Value Date/Time   WBC 5.5 10/13/2020 0700   RBC 3.83 (L) 10/13/2020 0700   HGB 13.1 (L) 10/13/2020 0700   HGB 14.0 07/10/2018 1135   HCT 39.0 10/13/2020 0700   HCT 41.0 07/10/2018 1135   PLT 126 (L) 10/13/2020 0700   PLT 150 07/10/2018 1135   MCV 101.8 (H) 10/13/2020 0700   MCV 97 07/10/2018 1135   MCH 34.2 (H) 10/13/2020 0700   MCHC 33.6 10/13/2020 0700   RDW 12.5 10/13/2020 0700   RDW 12.5 07/10/2018 1135   LYMPHSABS 1,799 10/13/2020 0700   LYMPHSABS 1.3 11/23/2016 1326   MONOABS 1.2 (H) 07/03/2020 1225   EOSABS 149 10/13/2020 0700   EOSABS 0.2 11/23/2016 1326   BASOSABS 50 10/13/2020 0700   BASOSABS 0.0 11/23/2016 1326    Micro: 08/07/2017 AFB sputum-negative 08/07/2017 respiratory-normal flora 06/29/2020: OP flora  Chest Imaging- films reviewed and as per EMR Pulmonary Functions Testing Results: Spirometry 2018 reviewed and interpreted as suggestive of mild restriction vs gas trapping      Assessment & Plan:     ICD-10-CM   1. Bronchiectasis without complication ([name])  T24.5        Multilobar bronchiectasis-possibly related to recurrent aspiration given distribution lower lobes.  Responds to steroids, Augmentin. 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 -Trialed Trelegy but perceived no benefit, resume in future if exacerbations recur  History of lung cancer- RUL pancoast tumor.  Previous tobacco abuse before quitting in 1990. -No concerning symptoms -No longer requires lung cancer screening for previous tobacco use.   RTC in 6 months with Dr. Silas Flood.  Robert Clam, MD

## 2021-05-18 NOTE — Progress Notes (Signed)
Remote pacemaker transmission.   

## 2021-05-21 ENCOUNTER — Encounter: Payer: Self-pay | Admitting: Internal Medicine

## 2021-05-23 ENCOUNTER — Other Ambulatory Visit: Payer: Self-pay | Admitting: Internal Medicine

## 2021-05-25 ENCOUNTER — Non-Acute Institutional Stay: Payer: Medicare Other

## 2021-05-25 DIAGNOSIS — N1831 Chronic kidney disease, stage 3a: Secondary | ICD-10-CM

## 2021-05-26 ENCOUNTER — Encounter: Payer: Self-pay | Admitting: Family Medicine

## 2021-05-26 ENCOUNTER — Other Ambulatory Visit: Payer: Self-pay

## 2021-05-26 ENCOUNTER — Non-Acute Institutional Stay (INDEPENDENT_AMBULATORY_CARE_PROVIDER_SITE_OTHER): Payer: BC Managed Care – PPO | Admitting: Family Medicine

## 2021-05-26 VITALS — BP 112/60 | HR 76 | Temp 97.8°F | Ht 72.0 in | Wt 176.8 lb

## 2021-05-26 DIAGNOSIS — I482 Chronic atrial fibrillation, unspecified: Secondary | ICD-10-CM | POA: Diagnosis not present

## 2021-05-26 DIAGNOSIS — E039 Hypothyroidism, unspecified: Secondary | ICD-10-CM

## 2021-05-26 DIAGNOSIS — J471 Bronchiectasis with (acute) exacerbation: Secondary | ICD-10-CM

## 2021-05-26 LAB — TEST AUTHORIZATION

## 2021-05-26 LAB — BASIC METABOLIC PANEL WITH GFR
BUN/Creatinine Ratio: 18 (calc) (ref 6–22)
BUN: 26 mg/dL — ABNORMAL HIGH (ref 7–25)
CO2: 25 mmol/L (ref 20–32)
Calcium: 9.8 mg/dL (ref 8.6–10.3)
Chloride: 103 mmol/L (ref 98–110)
Creat: 1.47 mg/dL — ABNORMAL HIGH (ref 0.70–1.22)
Glucose, Bld: 75 mg/dL (ref 65–99)
Potassium: 4.3 mmol/L (ref 3.5–5.3)
Sodium: 138 mmol/L (ref 135–146)
eGFR: 45 mL/min/{1.73_m2} — ABNORMAL LOW (ref >=60)

## 2021-05-26 LAB — TSH: TSH: 0.42 mIU/L (ref 0.40–4.50)

## 2021-05-26 NOTE — Progress Notes (Signed)
Provider:  Alain Honey, MD  Careteam: Patient Care Team: Wardell Honour, MD as PCP - General (Family Medicine) Evans Lance, MD as PCP - Cardiology (Cardiology) Evans Lance, MD (Cardiology) Katy Apo, MD as Consulting Physician (Ophthalmology) Juanito Doom, MD as Consulting Physician (Pulmonary Disease)  PLACE OF SERVICE:  [place] Directive information    No Known Allergies  Chief Complaint  Patient presents with   Medical Management of Chronic Issues    Patient presents today for a 6 month follow-up and lab review.   Quality Metric Gaps    TDAP, Zoster, Flu and Covid vaccines     HPI: Patient is a 87 y.o. male .  Seen today for medical management of chronic problems including atrial fibrillation bronchiectasis stage III kidney disease and hypothyroidism.  Had BMP done last week which showed stable kidney disease.  Wife who accompanies him today, thought that TSH was supposed to be measured.  His thyroid replacement was increased about 6 months ago after TSH was elevated at 16. Has had recent appointments with cardiology and pulmonology with changes in his current regimen  Review of Systems:  Review of Systems  Constitutional:  Positive for weight loss.  Respiratory: Negative.    Genitourinary: Negative.   All other systems reviewed and are negative.  Past Medical History:  Diagnosis Date   Abnormal CT of spine 10/24/2018   Per [place] new patient packet   Atrial fibrillation ([place])    Back pain    Per [place] new patient packet   Bradycardia    Per [facility] packet   Bronchiectasis [facility])    Per [place] new patient packet   COPD, mild ([place])    Per [place] new patient packet   Hx of colonoscopy 11/14/2007   Per [place] new patient packet   Hx of CT scan of chest 08/23/2018   Per [place] new patient packet   Hypothyroidism    Lung cancer [facility])    Per [place] new patient packet   Neck pain    Per [place] new patient packet   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[facility])     s/p XRT and surgical removal 1990   Pneumonia 2015   Symptomatic bradycardia    s/p PPM 1995   Past Surgical History:  Procedure Laterality Date   CATARACT EXTRACTION, BILATERAL     COLONOSCOPY  11/14/2007   Per [place] new patient packet   LUNG REMOVAL, PARTIAL     PACEMAKER [place] N/A 07/23/2018   Procedure: PPM GENERATOR CHANGEOUT;  Surgeon: Evans Lance, MD;  Location: [place] [facility];  Service: Cardiovascular;  Laterality: N/A;   Social History:   reports that he quit smoking about 32 years ago. His smoking use included cigarettes. He has a 40.00 pack-year smoking history. He has never used smokeless tobacco. He reports current alcohol use of about 7.0 standard drinks per week. He reports that he does not use drugs.  Family History  Problem Relation Age of Onset   Arrhythmia Father        Atrial fibrillation   Diabetes Brother        Insulin dependent   Breast cancer Sister    Breast cancer Sister     Medications: Patient's Medications  New Prescriptions   No medications on file  Previous Medications   ALBUTEROL (PROVENTIL) (2.5 MG/3ML) 0.083% NEBULIZER SOLUTION    Take 3 mLs (2.5 mg total) by  nebulization every 6 (six) hours as needed for wheezing or shortness of breath (cough).   ALBUTEROL (VENTOLIN HFA) 108 (90 BASE) MCG/ACT INHALER    Inhale 2 puffs into the lungs every 6 (six) hours as needed for wheezing or shortness of breath.   CHOLECALCIFEROL (VITAMIN D) 50 MCG (2000 UT) TABLET    Take 2,000 Units by mouth daily.   DOCUSATE SODIUM (COLACE) 100 MG CAPSULE    Take 100 mg by mouth 2 (two) times daily.   FUROSEMIDE (LASIX) 40 MG TABLET    Take 1 tablet (40 mg total) by mouth daily.   HYDROCODONE-ACETAMINOPHEN (NORCO) 10-325 MG TABLET    3 (three) times daily as needed.   LEVOTHYROXINE (SYNTHROID) 125 MCG TABLET    TAKE 1 TABLET BY MOUTH EVERY DAY   MAGNESIUM 500 MG CAPS    Take by mouth.   MULTIPLE VITAMIN (MULTIVITAMIN)  TABLET    Take 1 tablet by mouth daily.   PREDNISONE (DELTASONE) 20 MG TABLET    Take 1 tablet (20 mg total) by mouth daily with breakfast.   PSYLLIUM (REGULOID) 0.52 G CAPSULE    Take 0.52 g by mouth daily.   SODIUM CHLORIDE HYPERTONIC 3 % NEBULIZER SOLUTION    SMARTSIG:Via Inhaler   VITAMIN C (ASCORBIC ACID) 500 MG TABLET    Take 500 mg by mouth daily.   XARELTO 15 MG TABS TABLET    TAKE 1 TABLET BY MOUTH EVERY DAY WITH SUPPER  Modified Medications   No medications on file  Discontinued Medications   No medications on file    Physical Exam:  There were no vitals filed for this visit. There is no height or weight on file to calculate BMI. Wt Readings from Last 3 Encounters:  05/17/21 177 lb 6.4 oz (80.5 kg)  02/09/21 188 lb (85.3 kg)  01/13/21 195 lb 9.6 oz (88.7 kg)    Physical Exam Vitals and nursing note reviewed.  Constitutional:      Appearance: Normal appearance.  HENT:     Head: Normocephalic.  Cardiovascular:     Rate and Rhythm: Normal rate and regular rhythm.     Pulses: Normal pulses.  Pulmonary:     Effort: Pulmonary effort is normal.     Breath sounds: Wheezing present.  Neurological:     General: No focal deficit present.     Mental Status: He is alert and oriented to person, place, and time.     Comments: Get some basic memory test today.  He recalls 3 words at 5 minutes was able to spell world backwards as well as count backwards by 7 from 100.  Psychiatric:        Mood and Affect: Mood normal.        Behavior: Behavior normal.        Thought Content: Thought content normal.    Labs reviewed: Basic Metabolic Panel: Recent Labs    07/03/20 1225 08/11/20 0700 10/13/20 0700 05/24/21 0925  NA 134* 140  --  138  K 4.3 4.6  --  4.3  CL 99 103  --  103  CO2 26 22  --  25  GLUCOSE 92 90  --  75  BUN 26* 25  --  26*  CREATININE 1.43 1.45*  --  1.47*  CALCIUM 9.7 9.7  --  9.8  TSH  --  16.47* 3.19  --    Liver Function Tests: Recent Labs     07/03/20 1225 08/11/20 0700  AST  81* 22  ALT 52 10  ALKPHOS 114  --   BILITOT 0.9 0.9  PROT 7.9 7.4  ALBUMIN 3.9  --    No results for input(s): LIPASE, AMYLASE in the last 8760 hours. No results for input(s): AMMONIA in the last 8760 hours. CBC: Recent Labs    07/03/20 1225 08/11/20 0700 10/13/20 0700  WBC 7.5 6.3 5.5  NEUTROABS 5.3  --  2,541  HGB 12.4* 12.4* 13.1*  HCT 36.6* 36.6* 39.0  MCV 102.9* 103.1* 101.8*  PLT 195.0 143 126*   Lipid Panel: Recent Labs    08/11/20 0700  CHOL 144  HDL 59  LDLCALC 69  TRIG 78  CHOLHDL 2.4   TSH: Recent Labs    08/11/20 0700 10/13/20 0700  TSH 16.47* 3.19   A1C: No results found for: HGBA1C   Assessment/Plan  1. Hypothyroidism, unspecified type Last TSH was therapeutic we will repeat today at wife's request - TSH  2. Chronic atrial fibrillation (HCC) Patient seems to be in sinus rhythm today  3. Bronchiectasis with (acute) exacerbation ([place]) Has a standing prescription for Augmentin and prednisone that he takes with exacerbations   Alain Honey, MD [place] [phone]

## 2021-05-27 ENCOUNTER — Other Ambulatory Visit: Payer: Self-pay

## 2021-05-27 DIAGNOSIS — E039 Hypothyroidism, unspecified: Secondary | ICD-10-CM

## 2021-06-08 ENCOUNTER — Telehealth: Payer: Self-pay | Admitting: Pulmonary Disease

## 2021-06-08 NOTE — Telephone Encounter (Signed)
Spoke with the pt's spouse  She states pt had been coughing up brown/yellow, thick sputum for about 2 wks  He started having fever 06/04/21 so he started the prednisone and augmentin we gave to keep on hand  Sputum is clearing up now and no fever since 06/04/21  His breathing is doing well  She just wanted to keep Dr Silas Flood informed  Advised to call back if needed and will close this encounter and forward to MD as Juluis Rainier

## 2021-07-24 ENCOUNTER — Other Ambulatory Visit: Payer: Self-pay | Admitting: Internal Medicine

## 2021-07-26 NOTE — Telephone Encounter (Signed)
Pt last saw Dr Lovena Le 02/09/21, last labs 05/24/21 Creat 1.47, age 87, weight 80.2kg, CrCl 37.89, based on CrCl pt is on appropriate dosage of Xarelto 15mg  QD for afib. Will refill rx.

## 2021-08-04 ENCOUNTER — Ambulatory Visit (INDEPENDENT_AMBULATORY_CARE_PROVIDER_SITE_OTHER): Payer: BC Managed Care – PPO

## 2021-08-04 DIAGNOSIS — I495 Sick sinus syndrome: Secondary | ICD-10-CM | POA: Diagnosis not present

## 2021-08-04 LAB — CUP PACEART REMOTE DEVICE CHECK
Battery Remaining Longevity: 69 mo
Battery Voltage: 2.97 V
Brady Statistic RV Percent Paced: 97.41 %
Date Time Interrogation Session: 20221129201610
Implantable Lead Implant Date: 19950526
Implantable Lead Implant Date: 19950526
Implantable Lead Location: 753859
Implantable Lead Location: 753860
Implantable Lead Serial Number: 55140
Implantable Lead Serial Number: 55516
Implantable Pulse Generator Implant Date: 20191118
Lead Channel Impedance Value: 247 Ohm
Lead Channel Impedance Value: 361 Ohm
Lead Channel Pacing Threshold Amplitude: 2.5 V
Lead Channel Pacing Threshold Pulse Width: 0.4 ms
Lead Channel Sensing Intrinsic Amplitude: 5.125 mV
Lead Channel Sensing Intrinsic Amplitude: 5.125 mV
Lead Channel Setting Pacing Amplitude: 2.5 V
Lead Channel Setting Pacing Pulse Width: 1 ms
Lead Channel Setting Sensing Sensitivity: 1.2 mV

## 2021-08-06 ENCOUNTER — Other Ambulatory Visit: Payer: Self-pay | Admitting: Internal Medicine

## 2021-08-12 ENCOUNTER — Ambulatory Visit: Payer: BC Managed Care – PPO | Admitting: Pulmonary Disease

## 2021-08-12 ENCOUNTER — Telehealth: Payer: Self-pay | Admitting: Pulmonary Disease

## 2021-08-12 ENCOUNTER — Other Ambulatory Visit: Payer: Self-pay

## 2021-08-12 ENCOUNTER — Encounter: Payer: Self-pay | Admitting: Pulmonary Disease

## 2021-08-12 ENCOUNTER — Ambulatory Visit (INDEPENDENT_AMBULATORY_CARE_PROVIDER_SITE_OTHER): Payer: BC Managed Care – PPO

## 2021-08-12 VITALS — BP 120/68 | HR 67 | Temp 98.0°F | Ht 72.0 in | Wt 179.4 lb

## 2021-08-12 DIAGNOSIS — J449 Chronic obstructive pulmonary disease, unspecified: Secondary | ICD-10-CM | POA: Diagnosis not present

## 2021-08-12 DIAGNOSIS — R059 Cough, unspecified: Secondary | ICD-10-CM | POA: Diagnosis not present

## 2021-08-12 DIAGNOSIS — K219 Gastro-esophageal reflux disease without esophagitis: Secondary | ICD-10-CM

## 2021-08-12 DIAGNOSIS — R918 Other nonspecific abnormal finding of lung field: Secondary | ICD-10-CM | POA: Diagnosis not present

## 2021-08-12 DIAGNOSIS — J479 Bronchiectasis, uncomplicated: Secondary | ICD-10-CM | POA: Diagnosis not present

## 2021-08-12 DIAGNOSIS — J438 Other emphysema: Secondary | ICD-10-CM

## 2021-08-12 MED ORDER — TRELEGY ELLIPTA 100-62.5-25 MCG/ACT IN AEPB: 1.0000 | INHALATION_SPRAY | Freq: Every day | RESPIRATORY_TRACT | 2 refills | Status: DC

## 2021-08-12 MED ORDER — AMOXICILLIN-POT CLAVULANATE 875-125 MG PO TABS: 1.0000 | ORAL_TABLET | Freq: Two times a day (BID) | ORAL | 0 refills | Status: AC

## 2021-08-12 MED ORDER — PREDNISONE 20 MG PO TABS: 20.0000 mg | ORAL_TABLET | Freq: Every day | ORAL | 0 refills | Status: AC

## 2021-08-12 NOTE — Patient Instructions (Signed)
Nice to see you again  I recommend trying Trelegy, 1 puff daily.  Rinse mouth out after every use.  Lets see if this helps with your cough particularly at night.  I would use it for at least 1 month before passing judgment.  I sent a new prescription for prednisone 20 mg daily for 5 days and Augmentin 1 pill twice a day for 10 days in case you need these in the coming weeks or days if the cough or fever returns.  We will get a chest x-ray today.  Return to clinic in 3 months or sooner as needed with Dr. Silas Flood

## 2021-08-12 NOTE — Telephone Encounter (Signed)
Spoke with Rubi with [facility] regarding the CXR from today:  Persistent mass-like opacity within the right lower lobe.  Dr. Silas Flood, please advise.  Thank you.

## 2021-08-12 NOTE — Progress Notes (Addendum)
Synopsis: Referred in November 2018 for bronchiectasis by Wardell Honour, MD.  Previously patient of Dr. Lake Bells and Dr. Carlis Abbott.  Subjective:   PATIENT ID: Robert [name]. GENDER: male DOB: 1929-07-30, MRN: [id number]  Chief Complaint  Patient presents with   Follow-up    Pt states that his breathing is about the same but has not been great recently. More thick phlegm.     Robert [name] is 87 y.o. gentleman who presents for follow-up.  He is accompanied today by his wife.  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  Doing well today. Treated for bronchiectasis exacerbation 06/04/21.  Had worsening sputum production, fever.  Received 5 days of Augmentin a 5 days of prednisone 20 mg daily with vast improvement in symptoms.  He is doing ok otherwise.  Notes chronic issue with developed cough after drinking thin liquids.  Not worsening.  Over the last week or so, worsening nocturnal cough.  Seems to improve with albuterol administration.  Discussed resuming Trelegy acknowledging he did not find it very helpful in the past.  More he has repeat exacerbations and nocturnal cough have worsened since stopping Trelegy some months ago.  Reviewed CXR 07/03/20 with RLL large infiltrate felt to be related to aspiration event on my interpretation. Repeat Xray ordered never done.    Past Medical History:  Diagnosis Date   Abnormal CT of spine 10/24/2018   Per [facility] new patient packet   Atrial fibrillation ([place])    Back pain    Per [place] new patient packet   Bradycardia    Per [facility] new patient packet   Bronchiectasis [facility])    Per [place] new patient packet   COPD, mild ([place])    Per [place] new patient packet   Hx of colonoscopy 11/14/2007   Per [facility] new patient packet   Hx of CT scan of chest 08/23/2018   Per [place] new patient packet   Hypothyroidism    Lung cancer [facility])    Per [place] new patient packet   Neck pain    Per [place] new patient packet   Pancoast  tumor [facility])    s/p XRT and surgical removal 1990   Pneumonia 2015   Symptomatic bradycardia    s/p PPM 1995     Family History  Problem Relation Age of Onset   Arrhythmia Father        Atrial fibrillation   Diabetes Brother        Insulin dependent   Breast cancer Sister    Breast cancer Sister      Past Surgical History:  Procedure Laterality Date   CATARACT EXTRACTION, BILATERAL     COLONOSCOPY  11/14/2007   Per [place] new patient packet   LUNG REMOVAL, PARTIAL     PACEMAKER [place] N/A 07/23/2018   Procedure: PPM GENERATOR CHANGEOUT;  Surgeon: Evans Lance, MD;  Location: [place] [facility];  Service: Cardiovascular;  Laterality: N/A;    Social History   Socioeconomic History   Marital status: Married    Spouse name: Not on file   Number of children: 2   Years of education: Not on file   Highest education level: Not on file  Occupational History   Occupation: Retired  Tobacco Use   Smoking status: Former    Packs/day: 1.00    Years: 40.00    Pack years: 40.00    Types:  Cigarettes    Quit date: 09/05/1988    Years since quitting: 32.9   Smokeless tobacco: Never  Vaping Use   Vaping Use: Never used  Substance and Sexual Activity   Alcohol use: Yes    Alcohol/week: 7.0 standard drinks    Types: 7 Standard drinks or equivalent per week    Comment: 1 drink daily   Drug use: No   Sexual activity: Not on file  Other Topics Concern   Not on file  Social History Narrative   Diet      Do you drink/eat things with caffeine Yes      Marital Status Married What year were you married? 1957      Do you live in a house, apartment, assisted living, condo, trailer, etc.? Apartment toenhome      Is it one or more stories? One      How many persons live in your home? 2         Do you have any pets in your home?(please list) No      Highest level of education completed: Bachelors degree      Current or past profession: Research officer, trade union      Do you exercise?: Some    Type and how often: Various      Do you have a Living Will? No      Do you have a DNR form?         If not, would you like to discuss one?       Do you have signed POA/HPOA forms? No      Do you have difficulty bathing or dressing yourself? No      Do you have difficulty preparing food or eating? No      Do you have difficulty managing medications? No      Do you have difficulty managing your finances? No      Do you have difficulty affording your medications? No                  Social Determinants of Health   Financial Resource Strain: Not on file  Food Insecurity: Not on file  Transportation Needs: Not on file  Physical Activity: Not on file  Stress: Not on file  Social Connections: Not on file  Intimate Partner Violence: Not on file     No Known Allergies   Immunization History  Administered Date(s) Administered   Influenza Split 06/06/2017   Influenza, High Dose Seasonal PF 07/06/2019, 06/17/2020, 06/21/2021   Influenza,inj,quad, With Preservative 06/05/2018   PFIZER(Purple Top)SARS-COV-2 Vaccination 09/29/2019, 10/21/2019   Pneumococcal Conjugate-13 06/15/2016   Pneumococcal-Unspecified 03/11/2019    Outpatient Medications Prior to Visit  Medication Sig Dispense Refill   albuterol (PROVENTIL) (2.5 MG/3ML) 0.083% nebulizer solution Take 3 mLs (2.5 mg total) by nebulization every 6 (six) hours as needed for wheezing or shortness of breath (cough). 90 mL 11   albuterol (VENTOLIN HFA) 108 (90 Base) MCG/ACT inhaler Inhale 2 puffs into the lungs every 6 (six) hours as needed for wheezing or shortness of breath. 8 g 6   Cholecalciferol (VITAMIN D) 50 MCG (2000 UT) tablet Take 2,000 Units by mouth daily.     docusate sodium (COLACE) 100 MG capsule Take 100 mg by mouth 2 (two) times daily.     furosemide (LASIX) 40 MG tablet TAKE 1 TABLET BY MOUTH DAILY 90 tablet 1   HYDROcodone-acetaminophen (NORCO) 10-325 MG tablet 3  (three) times daily as needed.  levothyroxine (SYNTHROID) 125 MCG tablet TAKE 1 TABLET BY MOUTH EVERY DAY 90 tablet 3   Magnesium 500 MG CAPS Take by mouth.     Multiple Vitamin (MULTIVITAMIN) tablet Take 1 tablet by mouth daily.     psyllium (REGULOID) 0.52 g capsule Take 0.52 g by mouth daily.     sodium chloride HYPERTONIC 3 % nebulizer solution SMARTSIG:Via Inhaler     vitamin C (ASCORBIC ACID) 500 MG tablet Take 500 mg by mouth daily.     XARELTO 15 MG TABS tablet TAKE 1 TABLET BY MOUTH EVERY DAY WITH SUPPER 90 tablet 1   No facility-administered medications prior to visit.      Objective:   Vitals:   08/12/21 0957  BP: 120/68  Pulse: 67  Temp: 98 F (36.7 C)  TempSrc: Oral  SpO2: 98%  Weight: 179 lb 6.4 oz (81.4 kg)  Height: 6' (1.829 m)   98% on  RA BMI Readings from Last 3 Encounters:  08/12/21 24.33 kg/m  05/26/21 23.98 kg/m  05/17/21 24.06 kg/m   Wt Readings from Last 3 Encounters:  08/12/21 179 lb 6.4 oz (81.4 kg)  05/26/21 176 lb 12.8 oz (80.2 kg)  05/17/21 177 lb 6.4 oz (80.5 kg)    Physical examination: General: Well-appearing, no acute distress Respiratory: Clear aspiration bilaterally, no crackles or wheeze Cardiovascular: Regular rate and rhythm, no murmurs   CBC    Component Value Date/Time   WBC 5.5 10/13/2020 0700   RBC 3.83 (L) 10/13/2020 0700   HGB 13.1 (L) 10/13/2020 0700   HGB 14.0 07/10/2018 1135   HCT 39.0 10/13/2020 0700   HCT 41.0 07/10/2018 1135   PLT 126 (L) 10/13/2020 0700   PLT 150 07/10/2018 1135   MCV 101.8 (H) 10/13/2020 0700   MCV 97 07/10/2018 1135   MCH 34.2 (H) 10/13/2020 0700   MCHC 33.6 10/13/2020 0700   RDW 12.5 10/13/2020 0700   RDW 12.5 07/10/2018 1135   LYMPHSABS 1,799 10/13/2020 0700   LYMPHSABS 1.3 11/23/2016 1326   MONOABS 1.2 (H) 07/03/2020 1225   EOSABS 149 10/13/2020 0700   EOSABS 0.2 11/23/2016 1326   BASOSABS 50 10/13/2020 0700   BASOSABS 0.0 11/23/2016 1326    Micro: 08/07/2017 AFB  sputum-negative 08/07/2017 respiratory-normal flora 06/29/2020: OP flora  Chest Imaging- films reviewed and as per EMR Pulmonary Functions Testing Results: Spirometry 2018 reviewed and interpreted as suggestive of mild restriction vs gas trapping      Assessment & Plan:     ICD-10-CM   1. Bronchiectasis without complication ([place])  C37.6 DG Chest 2 View    2. Other emphysema ([place])  J43.8 DG Chest 2 View    3. Gastroesophageal reflux disease without esophagitis  K21.9         Multilobar bronchiectasis-possibly related to recurrent aspiration given distribution lower lobes.  Responds to steroids, Augmentin. 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 - Resume Trelegy given worsening cough, frequent exacerbations --Prednisone 20 mg  daily x5 days, prescription in hand in case has exacerbation to treat potential concomitant exacerbation of emphysema  History of lung cancer- RUL pancoast tumor.  Previous tobacco abuse before quitting in 1990. -No concerning symptoms -No longer requires lung cancer screening for previous tobacco use.   RTC in 3 months with Dr. Silas Flood.  Lanier Clam, MD

## 2021-08-12 NOTE — Telephone Encounter (Signed)
Spoke with wife.  Chest x-ray shows persistent but improved density right-sided infiltrate.  Present for over a year.  Doubt malignant.  Radiology recommends CT.  Discussed this with wife.  We agreed to move forward with CT scan for further evaluation.  Would allow [place] to see if there are additional parenchymal changes from prior CT scan in 2019.  Order for CT scan was placed.

## 2021-08-13 NOTE — Progress Notes (Signed)
Remote pacemaker transmission.   

## 2021-09-01 ENCOUNTER — Other Ambulatory Visit: Payer: Self-pay | Admitting: *Deleted

## 2021-09-01 DIAGNOSIS — E039 Hypothyroidism, unspecified: Secondary | ICD-10-CM

## 2021-09-02 ENCOUNTER — Other Ambulatory Visit: Payer: Medicare Other

## 2021-09-02 ENCOUNTER — Other Ambulatory Visit: Payer: Self-pay

## 2021-09-03 LAB — TSH: TSH: 2.19 mIU/L (ref 0.40–4.50)

## 2021-09-10 ENCOUNTER — Ambulatory Visit
Admission: RE | Admit: 2021-09-10 | Discharge: 2021-09-10 | Disposition: A | Discharge status: Home / Self Care | Payer: BC Managed Care – PPO | Source: Ambulatory Visit | Attending: Pulmonary Disease | Admitting: Pulmonary Disease

## 2021-09-10 DIAGNOSIS — R918 Other nonspecific abnormal finding of lung field: Secondary | ICD-10-CM

## 2021-10-07 ENCOUNTER — Encounter: Payer: Self-pay | Admitting: Physician Assistant

## 2021-10-14 ENCOUNTER — Other Ambulatory Visit: Payer: Self-pay

## 2021-10-14 ENCOUNTER — Ambulatory Visit: Payer: BC Managed Care – PPO | Admitting: Pulmonary Disease

## 2021-10-14 ENCOUNTER — Encounter: Payer: Self-pay | Admitting: Pulmonary Disease

## 2021-10-14 VITALS — BP 118/74 | HR 80 | Ht 72.0 in | Wt 178.0 lb

## 2021-10-14 DIAGNOSIS — J479 Bronchiectasis, uncomplicated: Secondary | ICD-10-CM | POA: Diagnosis not present

## 2021-10-14 DIAGNOSIS — R911 Solitary pulmonary nodule: Secondary | ICD-10-CM | POA: Diagnosis not present

## 2021-10-14 MED ORDER — AMOXICILLIN-POT CLAVULANATE 875-125 MG PO TABS: 1.0000 | ORAL_TABLET | Freq: Two times a day (BID) | ORAL | 0 refills | Status: AC

## 2021-10-14 NOTE — Patient Instructions (Addendum)
Nice to see you again  I ordered a PET scan - should be scheduled in the next week or 2  I sent a prescription for Augmentin to [facility] - go ahead and take that to see if it helps with cough  Use Trelegy 1 puff every day for 1 months to see if helps with cough  Return to clinic in 3 months or sooner as needed based on PET scan results

## 2021-10-14 NOTE — Progress Notes (Signed)
Synopsis: Referred in November 2018 for bronchiectasis by Wardell Honour, MD.  Previously patient of Dr. Lake Bells and Dr. Carlis Abbott.  Subjective:   PATIENT ID: Robert [name]. GENDER: male DOB: 18-Sep-1928, MRN: [id number]  Chief Complaint  Patient presents with   Follow-up    3 mo f/u, states he has been coughing up more phlegm. Light green phlegm.     Robert [name] is 11.age. gentleman who presents for follow-up.  He is accompanied today by his wife.  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  Doing well today. Treated for bronchiectasis exacerbation 12//22.  Had worsening sputum production..  Received 10 days of Augmentin a 5 days of prednisone 20 mg daily with vast improvement in symptoms.  However he has increasing frequency exacerbations over the last year or so.  Every 3 to 4 months.  Still with worsening cough overall.  Have asked him to resume Trelegy and he has yet to do this.  Had chest x-ray at last visit that showed right middle lobe infiltrate.  This prompted CT scan which shows right middle lobe scarring versus atelectasis on my interpretation.  Concern for possible malignancy on radiology read discussed at length with patient and wife today.  After shared decision decided to move forward with PET scan.     Past Medical History:  Diagnosis Date   Abnormal CT of spine 10/24/2018   Per [facility] new patient packet   Atrial fibrillation ([place])    Back pain    Per [place] new patient packet   Bradycardia    Per [facility] new patient packet   Bronchiectasis [facility])    Per [place] new patient packet   COPD, mild ([place])    Per [place] new patient packet   Hx of colonoscopy 11/14/2007   Per [place] new patient packet   Hx of CT scan of chest 08/23/2018   Per [place] new patient packet   Hypothyroidism    Lung cancer [facility])    Per [place] new patient packet   Neck pain    Per [place] new patient packet   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[facility])    s/p XRT and surgical removal 1990    Pneumonia 2015   Symptomatic bradycardia    s/p PPM 1995     Family History  Problem Relation Age of Onset   Arrhythmia Father        Atrial fibrillation   Diabetes Brother        Insulin dependent   Breast cancer Sister    Breast cancer Sister      Past Surgical History:  Procedure Laterality Date   CATARACT EXTRACTION, BILATERAL     COLONOSCOPY  11/14/2007   Per [place] new patient packet   LUNG REMOVAL, PARTIAL     PACEMAKER [place] N/A 07/23/2018   Procedure: PPM GENERATOR CHANGEOUT;  Surgeon: Evans Lance, MD;  Location: [place] [facility];  Service: Cardiovascular;  Laterality: N/A;    Social History   Socioeconomic History   Marital status: Married    Spouse name: Not on file   Number of children: 2   Years of education: Not on file   Highest education level: Not on file  Occupational History   Occupation: Retired  Tobacco Use   Smoking status: Former    Packs/day: 1.00    Years: 40.00    Pack years: 40.00    Types: Cigarettes  Quit date: 09/05/1988    Years since quitting: 33.1   Smokeless tobacco: Never  Vaping Use   Vaping Use: Never used  Substance and Sexual Activity   Alcohol use: Yes    Alcohol/week: 7.0 standard drinks    Types: 7 Standard drinks or equivalent per week    Comment: 1 drink daily   Drug use: No   Sexual activity: Not on file  Other Topics Concern   Not on file  Social History Narrative   Diet      Do you drink/eat things with caffeine Yes      Marital Status Married What year were you married? 1957      Do you live in a house, apartment, assisted living, condo, trailer, etc.? Apartment toenhome      Is it one or more stories? One      How many persons live in your home? 2         Do you have any pets in your home?(please list) No      Highest level of education completed: Bachelors degree      Current or past profession: Psychologist, sport and exercise      Do you exercise?: Some    Type and how  often: Various      Do you have a Living Will? No      Do you have a DNR form?         If not, would you like to discuss one?       Do you have signed POA/HPOA forms? No      Do you have difficulty bathing or dressing yourself? No      Do you have difficulty preparing food or eating? No      Do you have difficulty managing medications? No      Do you have difficulty managing your finances? No      Do you have difficulty affording your medications? No                  Social Determinants of Health   Financial Resource Strain: Not on file  Food Insecurity: Not on file  Transportation Needs: Not on file  Physical Activity: Not on file  Stress: Not on file  Social Connections: Not on file  Intimate Partner Violence: Not on file     No Known Allergies   Immunization History  Administered Date(s) Administered   Influenza Split 06/06/2017   Influenza, High Dose Seasonal PF 07/06/2019, 06/17/2020, 06/21/2021   Influenza,inj,quad, With Preservative 06/05/2018   PFIZER(Purple Top)SARS-COV-2 Vaccination 09/29/2019, 10/21/2019   Pneumococcal Conjugate-13 06/15/2016   Pneumococcal-Unspecified 03/11/2019    Outpatient Medications Prior to Visit  Medication Sig Dispense Refill   albuterol (PROVENTIL) (2.5 MG/3ML) 0.083% nebulizer solution Take 3 mLs (2.5 mg total) by nebulization every 6 (six) hours as needed for wheezing or shortness of breath (cough). 90 mL 11   albuterol (VENTOLIN HFA) 108 (90 Base) MCG/ACT inhaler Inhale 2 puffs into the lungs every 6 (six) hours as needed for wheezing or shortness of breath. 8 g 6   Cholecalciferol (VITAMIN D) 50 MCG (2000 UT) tablet Take 2,000 Units by mouth daily.     docusate sodium (COLACE) 100 MG capsule Take 100 mg by mouth 2 (two) times daily.     furosemide (LASIX) 40 MG tablet TAKE 1 TABLET BY MOUTH DAILY 90 tablet 1   HYDROcodone-acetaminophen (NORCO) 10-325 MG tablet 3 (three) times daily as needed.     levothyroxine (SYNTHROID)  125 MCG tablet TAKE 1 TABLET BY MOUTH EVERY DAY 90 tablet 3   Magnesium 500 MG CAPS Take by mouth.     Multiple Vitamin (MULTIVITAMIN) tablet Take 1 tablet by mouth daily.     psyllium (REGULOID) 0.52 g capsule Take 0.52 g by mouth daily.     sodium chloride HYPERTONIC 3 % nebulizer solution SMARTSIG:Via Inhaler     vitamin C (ASCORBIC ACID) 500 MG tablet Take 500 mg by mouth daily.     XARELTO 15 MG TABS tablet TAKE 1 TABLET BY MOUTH EVERY DAY WITH SUPPER 90 tablet 1   Fluticasone-Umeclidin-Vilant (TRELEGY ELLIPTA) 100-62.5-25 MCG/ACT AEPB Inhale 1 puff into the lungs daily. 60 each 2   No facility-administered medications prior to visit.      Objective:   Vitals:   10/14/21 1338  BP: 118/74  Pulse: 80  SpO2: 97%  Weight: 178 lb (80.7 kg)  Height: 6' (1.829 m)   97% on  RA BMI Readings from Last 3 Encounters:  10/14/21 24.14 kg/m  08/12/21 24.33 kg/m  05/26/21 23.98 kg/m   Wt Readings from Last 3 Encounters:  10/14/21 178 lb (80.7 kg)  08/12/21 179 lb 6.4 oz (81.4 kg)  05/26/21 176 lb 12.8 oz (80.2 kg)    Physical examination: General: Well-appearing, no acute distress Respiratory: Clear to auscultation bilaterally, no crackles or wheeze Cardiovascular: Regular rate and rhythm, no murmurs   CBC    Component Value Date/Time   WBC 5.5 10/13/2020 0700   RBC 3.83 (L) 10/13/2020 0700   HGB 13.1 (L) 10/13/2020 0700   HGB 14.0 07/10/2018 1135   HCT 39.0 10/13/2020 0700   HCT 41.0 07/10/2018 1135   PLT 126 (L) 10/13/2020 0700   PLT 150 07/10/2018 1135   MCV 101.8 (H) 10/13/2020 0700   MCV 97 07/10/2018 1135   MCH 34.2 (H) 10/13/2020 0700   MCHC 33.6 10/13/2020 0700   RDW 12.5 10/13/2020 0700   RDW 12.5 07/10/2018 1135   LYMPHSABS 1,799 10/13/2020 0700   LYMPHSABS 1.3 11/23/2016 1326   MONOABS 1.2 (H) 07/03/2020 1225   EOSABS 149 10/13/2020 0700   EOSABS 0.2 11/23/2016 1326   BASOSABS 50 10/13/2020 0700   BASOSABS 0.0 11/23/2016 1326    Micro: 08/07/2017  AFB sputum-negative 08/07/2017 respiratory-normal flora 06/29/2020: OP flora  Chest Imaging- films reviewed and as per EMR Pulmonary Functions Testing Results: Spirometry 2018 reviewed and interpreted as suggestive of mild restriction vs gas trapping      Assessment & Plan:     ICD-10-CM   1. Lung nodule  R91.1 NM PET Image Initial (PI) Skull Base To Thigh    2. Bronchiectasis without complication (HCC)  V67.2      Lung Nodule: Right middle lobe infiltrate favored to be atelectasis with scarring in the setting of recurrent pneumonia/exacerbations.  Some concern for lung cancer given his history of the same.  Favor 66-month CT follow-up.  Patient and wife after shared decision making with me, decision of PET scan now.  This was ordered.  Multilobar bronchiectasis-possibly related to recurrent aspiration given distribution lower lobes.  Responds to steroids, Augmentin. 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 - Instructed to resume Trelegy   History of lung cancer- RUL pancoast tumor.  Previous tobacco abuse before quitting in 1990. -No concerning symptoms   RTC in 3 months with Dr. Silas Flood.  Lanier Clam, MD

## 2021-10-18 ENCOUNTER — Ambulatory Visit: Payer: BC Managed Care – PPO | Admitting: Physician Assistant

## 2021-10-25 ENCOUNTER — Other Ambulatory Visit: Payer: Self-pay

## 2021-10-25 ENCOUNTER — Ambulatory Visit (HOSPITAL_COMMUNITY)
Admission: RE | Admit: 2021-10-25 | Discharge: 2021-10-25 | Disposition: A | Discharge status: Home / Self Care | Payer: BC Managed Care – PPO | Source: Ambulatory Visit | Attending: Pulmonary Disease | Admitting: Pulmonary Disease

## 2021-10-25 DIAGNOSIS — R911 Solitary pulmonary nodule: Secondary | ICD-10-CM | POA: Insufficient documentation

## 2021-10-25 LAB — GLUCOSE, CAPILLARY: Glucose-Capillary: 88 mg/dL (ref 70–99)

## 2021-10-25 MED ORDER — FLUDEOXYGLUCOSE F - 18 (FDG) INJECTION
8.8000 | Freq: Once | INTRAVENOUS | Status: AC
  Administered 2021-10-25: 8.6 via INTRAVENOUS

## 2021-11-03 ENCOUNTER — Ambulatory Visit (INDEPENDENT_AMBULATORY_CARE_PROVIDER_SITE_OTHER): Payer: BC Managed Care – PPO

## 2021-11-03 DIAGNOSIS — I495 Sick sinus syndrome: Secondary | ICD-10-CM

## 2021-11-03 LAB — CUP PACEART REMOTE DEVICE CHECK
Battery Remaining Longevity: 65 mo
Battery Voltage: 2.96 V
Brady Statistic RV Percent Paced: 97.34 %
Date Time Interrogation Session: 20230228200902
Implantable Lead Implant Date: 19950526
Implantable Lead Implant Date: 19950526
Implantable Lead Location: 753859
Implantable Lead Location: 753860
Implantable Lead Serial Number: 55140
Implantable Lead Serial Number: 55516
Implantable Pulse Generator Implant Date: 20191118
Lead Channel Impedance Value: 266 Ohm
Lead Channel Impedance Value: 380 Ohm
Lead Channel Pacing Threshold Amplitude: 2.5 V
Lead Channel Pacing Threshold Pulse Width: 0.4 ms
Lead Channel Sensing Intrinsic Amplitude: 6.5 mV
Lead Channel Sensing Intrinsic Amplitude: 6.5 mV
Lead Channel Setting Pacing Amplitude: 2.5 V
Lead Channel Setting Pacing Pulse Width: 1 ms
Lead Channel Setting Sensing Sensitivity: 1.2 mV

## 2021-11-11 ENCOUNTER — Ambulatory Visit: Payer: BC Managed Care – PPO | Admitting: Pulmonary Disease

## 2021-11-11 NOTE — Progress Notes (Signed)
Remote pacemaker transmission.   

## 2021-11-15 ENCOUNTER — Other Ambulatory Visit: Payer: Self-pay | Admitting: Pulmonary Disease

## 2021-11-15 DIAGNOSIS — J479 Bronchiectasis, uncomplicated: Secondary | ICD-10-CM

## 2021-11-17 ENCOUNTER — Non-Acute Institutional Stay (INDEPENDENT_AMBULATORY_CARE_PROVIDER_SITE_OTHER): Payer: BC Managed Care – PPO | Admitting: Family Medicine

## 2021-11-17 ENCOUNTER — Encounter: Payer: Self-pay | Admitting: Family Medicine

## 2021-11-17 ENCOUNTER — Other Ambulatory Visit: Payer: Self-pay

## 2021-11-17 VITALS — BP 120/70 | Temp 97.5°F | Ht 72.0 in | Wt 174.6 lb

## 2021-11-17 DIAGNOSIS — E039 Hypothyroidism, unspecified: Secondary | ICD-10-CM | POA: Diagnosis not present

## 2021-11-17 DIAGNOSIS — N1831 Chronic kidney disease, stage 3a: Secondary | ICD-10-CM | POA: Diagnosis not present

## 2021-11-17 DIAGNOSIS — I482 Chronic atrial fibrillation, unspecified: Secondary | ICD-10-CM

## 2021-11-17 DIAGNOSIS — Z7901 Long term (current) use of anticoagulants: Secondary | ICD-10-CM

## 2021-11-18 NOTE — Progress Notes (Signed)
? ? ?Provider:  ?Alain Honey, MD ? ?Careteam: ?Patient Care Team: ?Wardell Honour, MD as PCP - General (Family Medicine) ?Evans Lance, MD as PCP - Cardiology (Cardiology) ?Evans Lance, MD (Cardiology) ?Katy Apo, MD as Consulting Physician (Ophthalmology) ?Juanito Doom, MD as Consulting Physician (Pulmonary Disease) ? ?PLACE OF SERVICE:  ?[facility]  ?Advanced Directive information ?  ? ?No Known Allergies ? ?Chief Complaint  ?Patient presents with  ? Medical Management of Chronic Issues  ?  Patient presents today for a 6 month follow-up. ?  ? Quality Metric Gaps  ?  Zoster,TDAP,pneumonia, COVID booster  ? ? ? ?HPI: Patient is a 88 y.o. male patient is here for medical management of chronic problems including atrial fibrillation COPD, hypothyroidism, and osteoarthritis.  Recently had CT of chest as well as PET scan.  Has history of lung cancer 30 years ago and findings on recent scans were unremarkable. ?Even though he has atrial fibrillation and is anticoagulated he is on no rate controlling medicine.  I find this a little unusual and have asked him to approach their cardiologist at upcoming visit in several months with needs or not of rate controlling med. ?He is having some mild memory issues.  He is accompanied by his wife today who answers some of the questions but I think generally he is appropriate in his responses and think any memory issue is just related to age and not underlying dementia is. ? ?Review of Systems:  ?Review of Systems  ?Constitutional: Negative.   ?HENT: Negative.    ?Respiratory: Negative.    ?Cardiovascular:  Positive for leg swelling. Negative for palpitations.  ?Neurological: Negative.   ?Psychiatric/Behavioral: Negative.    ?All other systems reviewed and are negative. ? ?Past Medical History:  ?Diagnosis Date  ? Abnormal CT of spine 10/24/2018  ? Per [place] new patient packet  ? Atrial fibrillation ([place])   ? Back pain   ? Per [place] new patient packet  ? Bradycardia    ? Per [place] new patient packet  ? Bronchiectasis ([place])   ? Per [place] new patient packet  ? COPD, mild ([place])   ? Per [place] new patient packet  ? Hx of colonoscopy 11/14/2007  ? Per [place] new patient packet  ? Hx of CT scan of chest 08/23/2018  ? Per [place] new patient packet  ? Hypothyroidism   ? Lung cancer ([place])   ? Per [place] new patient packet  ? Neck pain   ? Per [place] new patient packet  ? 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 s/p XRT and surgical removal 1990  ? Pneumonia 2015  ? Symptomatic bradycardia   ? s/p PPM 1995  ? ?Past Surgical History:  ?Procedure Laterality Date  ? CATARACT EXTRACTION, BILATERAL    ? COLONOSCOPY  11/14/2007  ? Per [place] new patient packet  ? LUNG REMOVAL, PARTIAL    ? PACEMAKER INSERTION  1995  ? PPM GENERATOR CHANGEOUT N/A 07/23/2018  ? Procedure: PPM GENERATOR CHANGEOUT;  Surgeon: Evans Lance, MD;  Location: [facility];  Service: Cardiovascular;  Laterality: N/A;  ? ?Social History: ?  reports that he quit smoking about 33 years ago. His smoking use included cigarettes. He has a 40.00 pack-year smoking history. He has never used smokeless tobacco. He reports current alcohol use of about 7.0 standard drinks per week. He reports that he does not use drugs. ? ?Family History  ?Problem Relation Age of Onset  ? Arrhythmia Father   ?  Atrial fibrillation  ? Diabetes Brother   ?     Insulin dependent  ? Breast cancer Sister   ? Breast cancer Sister   ? ? ?Medications: ?Patient's Medications  ?New Prescriptions  ? No medications on file  ?Previous Medications  ? ALBUTEROL (PROVENTIL) (2.5 MG/3ML) 0.083% NEBULIZER SOLUTION    USE 1 VIAL IN NEBULIZER EVERY 6 HOURS AS NEEDED FOR WHEEZING FOR SHORTNESS OF BREATH  ? ALBUTEROL (VENTOLIN HFA) 108 (90 BASE) MCG/ACT INHALER    Inhale 2 puffs into the lungs every 6 (six) hours as needed for wheezing or shortness of breath.  ? CHOLECALCIFEROL (VITAMIN D) 50 MCG (2000 UT) TABLET    Take 2,000 Units by mouth daily.  ? DOCUSATE SODIUM (COLACE) 100 MG CAPSULE     Take 100 mg by mouth 2 (two) times daily.  ? FUROSEMIDE (LASIX) 40 MG TABLET    TAKE 1 TABLET BY MOUTH DAILY  ? HYDROCODONE-ACETAMINOPHEN (NORCO) 10-325 MG TABLET    3 (three) times daily as needed.  ? LEVOTHYROXINE (SYNTHROID) 125 MCG TABLET    TAKE 1 TABLET BY MOUTH EVERY DAY  ? MAGNESIUM 500 MG CAPS    Take by mouth.  ? MULTIPLE VITAMIN (MULTIVITAMIN) TABLET    Take 1 tablet by mouth daily.  ? PSYLLIUM (REGULOID) 0.52 G CAPSULE    Take 0.52 g by mouth daily.  ? TRELEGY ELLIPTA 100-62.5-25 MCG/ACT AEPB    Inhale 1 puff into the lungs daily.  ? VITAMIN C (ASCORBIC ACID) 500 MG TABLET    Take 500 mg by mouth daily.  ? XARELTO 15 MG TABS TABLET    TAKE 1 TABLET BY MOUTH EVERY DAY WITH SUPPER  ?Modified Medications  ? No medications on file  ?Discontinued Medications  ? SODIUM CHLORIDE HYPERTONIC 3 % NEBULIZER SOLUTION    SMARTSIG:Via Inhaler  ? ? ?Physical Exam: ? ?Vitals:  ? 11/17/21 1326  ?BP: 120/70  ?Temp: (!) 97.5 ?F (36.4 ?C)  ?Weight: 174 lb 9.6 oz (79.2 kg)  ?Height: 6' (1.829 m)  ? ?Body mass index is 23.68 kg/m?. ?Wt Readings from Last 3 Encounters:  ?11/17/21 174 lb 9.6 oz (79.2 kg)  ?10/14/21 178 lb (80.7 kg)  ?08/12/21 179 lb 6.4 oz (81.4 kg)  ? ? ?Physical Exam ?Vitals and nursing note reviewed.  ?Constitutional:   ?   Appearance: Normal appearance.  ?Cardiovascular:  ?   Rate and Rhythm: Normal rate and regular rhythm.  ?Pulmonary:  ?   Effort: Pulmonary effort is normal.  ?   Breath sounds: Normal breath sounds.  ?Musculoskeletal:     ?   General: Normal range of motion.  ?Skin: ?   General: Skin is warm and dry.  ?Neurological:  ?   General: No focal deficit present.  ?   Mental Status: He is alert and oriented to person, place, and time.  ? ? ?Labs reviewed: ?Basic Metabolic Panel: ?Recent Labs  ?  05/24/21 ?0925 09/02/21 ?1749  ?NA 138  --   ?K 4.3  --   ?CL 103  --   ?CO2 25  --   ?GLUCOSE 75  --   ?BUN 26*  --   ?CREATININE 1.47*  --   ?CALCIUM 9.8  --   ?TSH 0.42 2.19  ? ?Liver Function  Tests: ?No results for input(s): AST, ALT, ALKPHOS, BILITOT, PROT, ALBUMIN in the last 8760 hours. ?No results for input(s): LIPASE, AMYLASE in the last 8760 hours. ?No results for input(s): AMMONIA in the  last 8760 hours. ?CBC: ?No results for input(s): WBC, NEUTROABS, HGB, HCT, MCV, PLT in the last 8760 hours. ?Lipid Panel: ?No results for input(s): CHOL, HDL, LDLCALC, TRIG, CHOLHDL, LDLDIRECT in the last 8760 hours. ?TSH: ?Recent Labs  ?  05/24/21 ?0925 09/02/21 ?0705  ?TSH 0.42 2.19  ? ?A1C: ?No results found for: HGBA1C ? ? ?Assessment/Plan ? ?1. Chronic anticoagulation ?As long as there is risk of A-fib I think use of anticoagulant is appropriate.  He has not had any falls which would put him at high risk with anticoagulation. ? ?2. Hypothyroidism, unspecified type ?Last TSH was 3 months ago and was in the therapeutic range ? ?3. Stage 3a chronic kidney disease ([place]) ?Most recent creatinine 1.47.  We will continue to monitor this.  Again today stressed importance of good hydration. ? ?4. Chronic atrial fibrillation (HCC) ?As noted above he is not on any rate controlling medicine and I would wonder about need for that.  He does have a pacemaker but that was apparently for sick sinus syndrome. ? ? ?Alain Honey, MD ?[facility] ?[phone]  ? ?

## 2021-12-17 ENCOUNTER — Telehealth: Payer: Self-pay | Admitting: Pulmonary Disease

## 2021-12-17 DIAGNOSIS — J479 Bronchiectasis, uncomplicated: Secondary | ICD-10-CM

## 2021-12-17 MED ORDER — ALBUTEROL SULFATE (2.5 MG/3ML) 0.083% IN NEBU: INHALATION_SOLUTION | RESPIRATORY_TRACT | 5 refills | Status: AC

## 2021-12-17 NOTE — Telephone Encounter (Signed)
Rx for pt's albuterol neb sol has been [facility] to [facility] off of [address] for pt. Attempted to call pt's spouse Romie Minus to let her know this had been done but unable to reach. Left a detailed message letting her know this was done. Nothing further needed. ?

## 2022-01-11 ENCOUNTER — Encounter: Payer: Self-pay | Admitting: Pulmonary Disease

## 2022-01-11 ENCOUNTER — Ambulatory Visit (INDEPENDENT_AMBULATORY_CARE_PROVIDER_SITE_OTHER): Payer: BC Managed Care – PPO | Admitting: Pulmonary Disease

## 2022-01-11 VITALS — BP 110/64 | HR 66 | Temp 97.9°F | Ht 72.0 in | Wt 176.4 lb

## 2022-01-11 DIAGNOSIS — R053 Chronic cough: Secondary | ICD-10-CM

## 2022-01-11 DIAGNOSIS — R911 Solitary pulmonary nodule: Secondary | ICD-10-CM

## 2022-01-11 DIAGNOSIS — J479 Bronchiectasis, uncomplicated: Secondary | ICD-10-CM | POA: Diagnosis not present

## 2022-01-11 MED ORDER — TRELEGY ELLIPTA 100-62.5-25 MCG/ACT IN AEPB: 1.0000 | INHALATION_SPRAY | Freq: Every day | RESPIRATORY_TRACT | 11 refills | Status: AC

## 2022-01-11 MED ORDER — AMOXICILLIN-POT CLAVULANATE 875-125 MG PO TABS: 1.0000 | ORAL_TABLET | Freq: Two times a day (BID) | ORAL | 0 refills | Status: AC

## 2022-01-11 NOTE — Progress Notes (Signed)
? ?Synopsis: Referred in November 2018 for bronchiectasis by Wardell Honour, MD.  Previously patient of Dr. Lake Bells and Dr. Carlis Abbott. ? ?Subjective:  ? ?PATIENT ID: Robert [name]. GENDER: male DOB: [birth year], MRN: [id number] ? ?Chief Complaint  ?Patient presents with  ? Follow-up  ?  Pt is wanting to discuss getting a possible antibiotic today he states that he is having more of a productive cough and this occurs for him often.   ? ? ?Robert [name] is 88 y.o. gentleman who presents for follow-up.  He is accompanied today by his wife.  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   ? ?Had asked him to use Trelegy daily as prescribed to see if would improve cough that seems intermittently worse over last several months.  He and wife both endorse that this has improved frequency and severity of cough.  Overall and pleased with this.  We reviewed the results of his PET scan performed a couple months ago.  This was a follow-up spot on the right.  This was overall reassuring without significant PET activity on my review and interpretation.  As discussed at our last visit, likely reflects chronic scarring, inflammation/inflammatory changes related to bronchiectasis.  After shared decision making, no further follow-up warranted. ? ? ? ? ?Past Medical History:  ?Diagnosis Date  ? Abnormal CT of spine 10/24/2018  ? Per [place] new patient packet  ? Atrial fibrillation ([place])   ? Back pain   ? Per [place] new patient packet  ? Bradycardia   ? Per [place] new patient packet  ? Bronchiectasis ([place])   ? Per [place] new patient packet  ? COPD, mild ([place])   ? Per [place] new patient packet  ? Hx of colonoscopy 11/14/2007  ? Per [place] new patient packet  ? Hx of CT scan of chest 08/23/2018  ? Per [place] new patient packet  ? Hypothyroidism   ? Lung cancer ([place])   ? Per [place] new patient packet  ? Neck pain   ? Per [place] new patient packet  ? 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 s/p XRT and surgical removal 1990  ?  Pneumonia 2015  ? Symptomatic bradycardia   ? s/p PPM 1995  ?  ? ?Family History  ?Problem Relation Age of Onset  ? Arrhythmia Father   ?     Atrial fibrillation  ? Diabetes Brother   ?     Insulin dependent  ? Breast cancer Sister   ? Breast cancer Sister   ?  ? ?Past Surgical History:  ?Procedure Laterality Date  ? CATARACT EXTRACTION, BILATERAL    ? COLONOSCOPY  11/14/2007  ? Per [place] new patient packet  ? LUNG REMOVAL, PARTIAL    ? PACEMAKER INSERTION  1995  ? PPM GENERATOR CHANGEOUT N/A 07/23/2018  ? Procedure: PPM GENERATOR CHANGEOUT;  Surgeon: Evans Lance, MD;  Location: [place];  Service: Cardiovascular;  Laterality: N/A;  ? ? ?Social History  ? ?Socioeconomic History  ? Marital status: Married  ?  Spouse name: Not on file  ? Number of children: 2  ? Years of education: Not on file  ? Highest education level: Not on file  ?Occupational History  ? Occupation: Retired  ?Tobacco Use  ? Smoking status: Former  ?  Packs/day: 1.00  ?  Years: 40.00  ?  Pack years: 40.00  ?  Types: Cigarettes  ?  Quit date: 09/05/1988  ?  Years since quitting: 33.3  ? Smokeless tobacco: Never  ?Vaping Use  ? Vaping Use: Never used  ?Substance and Sexual Activity  ? Alcohol use: Yes  ?  Alcohol/week: 7.0 standard drinks  ?  Types: 7 Standard drinks or equivalent per week  ?  Comment: 1 drink daily  ? Drug use: No  ? Sexual activity: Not on file  ?Other Topics Concern  ? Not on file  ?Social History Narrative  ? Diet  ?   ? Do you drink/eat things with caffeine Yes  ?   ? Marital Status Married What year were you married? 1957  ?   ? Do you live in a house, apartment, assisted living, condo, trailer, etc.? Apartment toenhome  ?   ? Is it one or more stories? One  ?   ? How many persons live in your home? 2     ?   ? Do you have any pets in your home?(please list) No  ?   ? Highest level of education completed: Bachelors degree  ?   ? Current or past profession: Psychologist, sport and exercise  ?   ? Do you exercise?: Some    Type and how  often: Various  ?   ? Do you have a Living Will? No  ?   ? Do you have a DNR form?         If not, would you like to discuss one?   ?   ? Do you have signed POA/HPOA forms? No  ?   ? Do you have difficulty bathing or dressing yourself? No  ?   ? Do you have difficulty preparing food or eating? No  ?   ? Do you have difficulty managing medications? No  ?   ? Do you have difficulty managing your finances? No  ?   ? Do you have difficulty affording your medications? No  ?   ?   ?   ?   ?   ? ?Social Determinants of Health  ? ?Financial Resource Strain: Not on file  ?Food Insecurity: Not on file  ?Transportation Needs: Not on file  ?Physical Activity: Not on file  ?Stress: Not on file  ?Social Connections: Not on file  ?Intimate Partner Violence: Not on file  ?  ? ?No Known Allergies  ? ?Immunization History  ?Administered Date(s) Administered  ? Influenza Split 06/06/2017  ? Influenza, High Dose Seasonal PF 07/06/2019, 06/17/2020, 06/21/2021  ? Influenza,inj,quad, With Preservative 06/05/2018  ? PFIZER(Purple Top)SARS-COV-2 Vaccination 09/29/2019, 10/21/2019  ? Pneumococcal Conjugate-13 06/15/2016  ? Pneumococcal-Unspecified 03/11/2019  ? ? ?Outpatient Medications Prior to Visit  ?Medication Sig Dispense Refill  ? albuterol (PROVENTIL) (2.5 MG/3ML) 0.083% nebulizer solution USE 1 VIAL IN NEBULIZER EVERY 6 HOURS AS NEEDED FOR WHEEZING FOR SHORTNESS OF BREATH 900 mL 5  ? albuterol (VENTOLIN HFA) 108 (90 Base) MCG/ACT inhaler Inhale 2 puffs into the lungs every 6 (six) hours as needed for wheezing or shortness of breath. 8 g 6  ? Cholecalciferol (VITAMIN D) 50 MCG (2000 UT) tablet Take 2,000 Units by mouth daily.    ? docusate sodium (COLACE) 100 MG capsule Take 100 mg by mouth 2 (two) times daily.    ? furosemide (LASIX) 40 MG tablet TAKE 1 TABLET BY MOUTH DAILY 90 tablet 1  ? HYDROcodone-acetaminophen (NORCO) 10-325 MG tablet 3 (three) times daily as needed.    ? levothyroxine (SYNTHROID) 125 MCG tablet TAKE 1 TABLET BY  MOUTH EVERY DAY  90 tablet 3  ? Magnesium 500 MG CAPS Take by mouth.    ? Multiple Vitamin (MULTIVITAMIN) tablet Take 1 tablet by mouth daily.    ? psyllium (REGULOID) 0.52 g capsule Take 0.52 g by mouth daily.    ? TRELEGY ELLIPTA 100-62.5-25 MCG/ACT AEPB Inhale 1 puff into the lungs daily.    ? vitamin C (ASCORBIC ACID) 500 MG tablet Take 500 mg by mouth daily.    ? XARELTO 15 MG TABS tablet TAKE 1 TABLET BY MOUTH EVERY DAY WITH SUPPER 90 tablet 1  ? ?No facility-administered medications prior to visit.  ? ? ? ? ?Objective:  ? ?There were no vitals filed for this visit. ? ?  on  RA ?BMI Readings from Last 3 Encounters:  ?11/17/21 23.68 kg/m?  ?10/14/21 24.14 kg/m?  ?08/12/21 24.33 kg/m?  ? ?Wt Readings from Last 3 Encounters:  ?11/17/21 174 lb 9.6 oz (79.2 kg)  ?10/14/21 178 lb (80.7 kg)  ?08/12/21 179 lb 6.4 oz (81.4 kg)  ? ? ?Physical examination: ?General: Well-appearing, no acute distress ?Respiratory: Clear to auscultation bilaterally, no crackles or wheeze ?Cardiovascular: Regular rate and rhythm, no murmurs ? ? ?CBC ?   ?Component Value Date/Time  ? WBC 5.5 10/13/2020 0700  ? RBC 3.83 (L) 10/13/2020 0700  ? HGB 13.1 (L) 10/13/2020 0700  ? HGB 14.0 07/10/2018 1135  ? HCT 39.0 10/13/2020 0700  ? HCT 41.0 07/10/2018 1135  ? PLT 126 (L) 10/13/2020 0700  ? PLT 150 07/10/2018 1135  ? MCV 101.8 (H) 10/13/2020 0700  ? MCV 97 07/10/2018 1135  ? MCH 34.2 (H) 10/13/2020 0700  ? MCHC 33.6 10/13/2020 0700  ? RDW 12.5 10/13/2020 0700  ? RDW 12.5 07/10/2018 1135  ? LYMPHSABS 1,799 10/13/2020 0700  ? LYMPHSABS 1.3 11/23/2016 1326  ? MONOABS 1.2 (H) 07/03/2020 1225  ? EOSABS 149 10/13/2020 0700  ? EOSABS 0.2 11/23/2016 1326  ? BASOSABS 50 10/13/2020 0700  ? BASOSABS 0.0 11/23/2016 1326  ? ? ?Micro: ?08/07/2017 AFB sputum-negative ?08/07/2017 respiratory-normal flora ?06/29/2020: OP flora ? ?Chest Imaging- films reviewed and as per EMR ?Pulmonary Functions Testing Results: ?Spirometry 2018 reviewed and interpreted as suggestive  of mild restriction vs gas trapping ? ? ?   ?Assessment & Plan:  ? ?No diagnosis found. ? ?Lung Nodule: Right middle lobe infiltrate favored to be atelectasis with scarring in the setting of recurrent pneumo

## 2022-01-11 NOTE — Patient Instructions (Signed)
Nice to see you again ? ?I sent a supply of Augmentin that you can use if your cough worsens or concern for infection setting in.  Please let me know if you need to take this. ? ?I refilled the Trelegy.  Continue to take daily as you are.  I am glad it seems it is helping. ? ?Return to clinic in 3 months or sooner as needed with Dr. Silas Flood ?

## 2022-01-18 ENCOUNTER — Other Ambulatory Visit: Payer: Self-pay | Admitting: Internal Medicine

## 2022-01-18 DIAGNOSIS — I482 Chronic atrial fibrillation, unspecified: Secondary | ICD-10-CM

## 2022-01-18 NOTE — Telephone Encounter (Signed)
Xarelto 15mg  refill request received. Pt is 88 years old, weight-80kg, Crea-1.47 on 05/24/2021, last seen by Dr. Lovena Le on 02/09/2021, Diagnosis-Afib, [place].44ml/min; Dose is appropriate based on dosing criteria. Will send in refill to requested pharmacy.    ?

## 2022-02-02 ENCOUNTER — Ambulatory Visit (INDEPENDENT_AMBULATORY_CARE_PROVIDER_SITE_OTHER): Payer: BC Managed Care – PPO

## 2022-02-02 DIAGNOSIS — I495 Sick sinus syndrome: Secondary | ICD-10-CM

## 2022-02-03 LAB — CUP PACEART REMOTE DEVICE CHECK
Battery Remaining Longevity: 60 mo
Battery Voltage: 2.96 V
Brady Statistic RV Percent Paced: 99.06 %
Date Time Interrogation Session: 20230530204709
Implantable Lead Implant Date: 19950526
Implantable Lead Implant Date: 19950526
Implantable Lead Location: 753859
Implantable Lead Location: 753860
Implantable Lead Serial Number: 55140
Implantable Lead Serial Number: 55516
Implantable Pulse Generator Implant Date: 20191118
Lead Channel Impedance Value: 266 Ohm
Lead Channel Impedance Value: 380 Ohm
Lead Channel Pacing Threshold Amplitude: 2.5 V
Lead Channel Pacing Threshold Pulse Width: 0.4 ms
Lead Channel Sensing Intrinsic Amplitude: 5.75 mV
Lead Channel Sensing Intrinsic Amplitude: 5.75 mV
Lead Channel Setting Pacing Amplitude: 2.5 V
Lead Channel Setting Pacing Pulse Width: 1 ms
Lead Channel Setting Sensing Sensitivity: 1.2 mV

## 2022-02-08 ENCOUNTER — Other Ambulatory Visit: Payer: Self-pay | Admitting: Internal Medicine

## 2022-02-15 NOTE — Progress Notes (Signed)
Remote pacemaker transmission.   

## 2022-02-21 ENCOUNTER — Ambulatory Visit: Payer: BC Managed Care – PPO | Admitting: Internal Medicine

## 2022-02-21 ENCOUNTER — Encounter: Payer: Self-pay | Admitting: Internal Medicine

## 2022-02-21 VITALS — BP 120/82 | HR 84 | Ht 72.0 in | Wt 181.0 lb

## 2022-02-21 DIAGNOSIS — Z950 Presence of cardiac pacemaker: Secondary | ICD-10-CM

## 2022-02-21 DIAGNOSIS — I495 Sick sinus syndrome: Secondary | ICD-10-CM | POA: Diagnosis not present

## 2022-02-21 DIAGNOSIS — I482 Chronic atrial fibrillation, unspecified: Secondary | ICD-10-CM

## 2022-02-21 LAB — CUP PACEART INCLINIC DEVICE CHECK
Battery Remaining Longevity: 63 mo
Battery Voltage: 2.96 V
Brady Statistic RV Percent Paced: 97.64 %
Date Time Interrogation Session: 20230619165822
Implantable Lead Implant Date: 19950526
Implantable Lead Implant Date: 19950526
Implantable Lead Location: 753859
Implantable Lead Location: 753860
Implantable Lead Serial Number: 55140
Implantable Lead Serial Number: 55516
Implantable Pulse Generator Implant Date: 20191118
Lead Channel Impedance Value: 247 Ohm
Lead Channel Impedance Value: 361 Ohm
Lead Channel Pacing Threshold Amplitude: 2.5 V
Lead Channel Pacing Threshold Pulse Width: 0.4 ms
Lead Channel Sensing Intrinsic Amplitude: 5.375 mV
Lead Channel Sensing Intrinsic Amplitude: 5.75 mV
Lead Channel Setting Pacing Amplitude: 2.5 V
Lead Channel Setting Pacing Pulse Width: 1 ms
Lead Channel Setting Sensing Sensitivity: 1.2 mV

## 2022-02-21 NOTE — Patient Instructions (Addendum)
Medication Instructions:  Your physician recommends that you continue on your current medications as directed. Please refer to the Current Medication list given to you today.   Dr. Lovena Le recommends you wear support stockings;  See prescription.    Labwork: None ordered.  Testing/Procedures: None ordered.  Follow-Up: Your physician wants you to follow-up in: one year with [name], MD or one of the following Advanced Practice Providers on your designated Care Team:   Tommye Standard, [place] Legrand Como "Jonni Sanger" Chalmers Cater, [place]  Remote monitoring is used to monitor your Pacemaker from home. This monitoring reduces the number of office visits required to check your device to one time per year. It allows [place] to keep an eye on the functioning of your device to ensure it is working properly. You are scheduled for a device check from home on 05/04/2022. You may send your transmission at any time that day. If you have a wireless device, the transmission will be sent automatically. After your physician reviews your transmission, you will receive a postcard with your next transmission date.  Any Other Special Instructions Will Be Listed Below (If Applicable).  If you need a refill on your cardiac medications before your next appointment, please call your pharmacy.   Important Information About Sugar

## 2022-02-21 NOTE — Progress Notes (Signed)
HPI [name] returns today for followup. He is a pleasant 88 yo man with a h/o CHB, sinus node dysfnction and atrial fib. He is s/p PPM insertion. He has chronically elevated RV pacing threshold. He has an occluded left subclavian vein. He has remote lung CA. In the interim, he notes some dyspnea with exertion. No chest pain. Mild peripheral edema. He had no trouble healing after his PM gen change out. He has class 2 CHF symptoms.  No Known Allergies   Current Outpatient Medications  Medication Sig Dispense Refill   albuterol (PROVENTIL) (2.5 MG/3ML) 0.083% nebulizer solution USE 1 VIAL IN NEBULIZER EVERY 6 HOURS AS NEEDED FOR WHEEZING FOR SHORTNESS OF BREATH 900 mL 5   albuterol (VENTOLIN HFA) 108 (90 Base) MCG/ACT inhaler Inhale 2 puffs into the lungs every 6 (six) hours as needed for wheezing or shortness of breath. 8 g 6   Cholecalciferol (VITAMIN D) 50 MCG (2000 UT) tablet Take 2,000 Units by mouth daily.     docusate sodium (COLACE) 100 MG capsule Take 100 mg by mouth 2 (two) times daily.     furosemide (LASIX) 40 MG tablet TAKE 1 TABLET BY MOUTH EVERY DAY 90 tablet 0   HYDROcodone-acetaminophen (NORCO) 10-325 MG tablet 3 (three) times daily as needed.     levothyroxine (SYNTHROID) 125 MCG tablet TAKE 1 TABLET BY MOUTH EVERY DAY 90 tablet 3   Magnesium 500 MG CAPS Take by mouth.     Multiple Vitamin (MULTIVITAMIN) tablet Take 1 tablet by mouth daily.     psyllium (REGULOID) 0.52 g capsule Take 0.52 g by mouth daily.     TRELEGY ELLIPTA 100-62.5-25 MCG/ACT AEPB Inhale 1 puff into the lungs daily. 60 each 11   vitamin C (ASCORBIC ACID) 500 MG tablet Take 500 mg by mouth daily.     XARELTO 15 MG TABS tablet TAKE 1 TABLET BY MOUTH EVERY DAY WITH SUPPER 90 tablet 1   No current facility-administered medications for this visit.     Past Medical History:  Diagnosis Date   Abnormal CT of spine 10/24/2018   Per [facility] new patient packet   Atrial fibrillation ([place])    Back pain     Per [place] new patient packet   Bradycardia    Per [facility] new patient packet   Bronchiectasis [facility])    Per [place] new patient packet   COPD, mild ([place])    Per [place] new patient packet   Hx of colonoscopy 11/14/2007   Per [place] new patient packet   Hx of CT scan of chest 08/23/2018   Per [place] new patient packet   Hypothyroidism    Lung cancer ([place])    Per [place] new patient packet   Neck pain    Per [place] new patient packet   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[facility])    s/p XRT and surgical removal 1990   Pneumonia 2015   Symptomatic bradycardia    s/p PPM 1995    ROS:   All systems reviewed and negative except as noted in the HPI.   Past Surgical History:  Procedure Laterality Date   CATARACT EXTRACTION, BILATERAL     COLONOSCOPY  11/14/2007   Per [place] new patient packet   LUNG REMOVAL, PARTIAL     PACEMAKER INSERTION  1995   [place] N/A 07/23/2018   Procedure: PPM GENERATOR CHANGEOUT;  Surgeon: Evans Lance, MD;  Location: [place] [facility];  Service: Cardiovascular;  Laterality: N/A;  Family History  Problem Relation Age of Onset   Arrhythmia Father        Atrial fibrillation   Diabetes Brother        Insulin dependent   Breast cancer Sister    Breast cancer Sister      Social History   Socioeconomic History   Marital status: Married    Spouse name: Not on file   Number of children: 2   Years of education: Not on file   Highest education level: Not on file  Occupational History   Occupation: Retired  Tobacco Use   Smoking status: Former    Packs/day: 1.00    Years: 40.00    Total pack years: 40.00    Types: Cigarettes    Quit date: 09/05/1988    Years since quitting: 33.4   Smokeless tobacco: Never  Vaping Use   Vaping Use: Never used  Substance and Sexual Activity   Alcohol use: Yes    Alcohol/week: 7.0 standard drinks of alcohol    Types: 7 Standard drinks or equivalent per week    Comment: 1 drink daily   Drug use: No   Sexual activity: Not on file   Other Topics Concern   Not on file  Social History Narrative   Diet      Do you drink/eat things with caffeine Yes      Marital Status Married What year were you married? 1957      Do you live in a house, apartment, assisted living, condo, trailer, etc.? Apartment toenhome      Is it one or more stories? One      How many persons live in your home? 2         Do you have any pets in your home?(please list) No      Highest level of education completed: Bachelors degree      Current or past profession: Psychologist, sport and exercise      Do you exercise?: Some    Type and how often: Various      Do you have a Living Will? No      Do you have a DNR form?         If not, would you like to discuss one?       Do you have signed POA/HPOA forms? No      Do you have difficulty bathing or dressing yourself? No      Do you have difficulty preparing food or eating? No      Do you have difficulty managing medications? No      Do you have difficulty managing your finances? No      Do you have difficulty affording your medications? No                  Social Determinants of Radio broadcast assistant Strain: Not on file  Food Insecurity: Not on file  Transportation Needs: Not on file  Physical Activity: Not on file  Stress: Not on file  Social Connections: Not on file  Intimate Partner Violence: Not on file     BP 120/82   Pulse 84   Ht 6' (1.829 m)   Wt 181 lb (82.1 kg)   SpO2 93%   BMI 24.55 kg/m   Physical Exam:  Well appearing NAD HEENT: Unremarkable Neck:  No JVD, no thyromegally Lymphatics:  No adenopathy Back:  No CVA tenderness Lungs:  Clear HEART:  Regular rate rhythm, no murmurs, no  rubs, no clicks Abd:  soft, positive bowel sounds, no organomegally, no rebound, no guarding Ext:  2 plus pulses, no edema, no cyanosis, no clubbing Skin:  No rashes no nodules Neuro:  CN II through XII intact, motor grossly intact  EKG  DEVICE  Normal device function.  See  PaceArt for details.   Assess/Plan:  1. Atrial fib - his VR is well controlled. He is tolerating xarelto for stroke prevention. 2. CHB - he has minimal escape. He is asymptomatic, s/p PPM insertion. 3. PPM - he has chronic elevated ventricular pacing threshold but it remains stable. His battery longevity is over 4 years. 4. Diastolic heart failure - I reminded he and his wife of the importance of a low sodium diet.He will continue lasix.    Carleene Overlie Abel Ra,MD

## 2022-03-03 ENCOUNTER — Telehealth: Payer: Self-pay | Admitting: Family Medicine

## 2022-03-03 NOTE — Telephone Encounter (Signed)
Spouse calling to see if Dr. Sabra Heck wants pt to have labs prior to appt at [facility] on 06/08/22.

## 2022-03-03 NOTE — Telephone Encounter (Signed)
Forwarded message to Dr. Sabra Heck to place if needed.  Please Advise.

## 2022-03-08 ENCOUNTER — Other Ambulatory Visit: Payer: Self-pay | Admitting: Family Medicine

## 2022-03-08 DIAGNOSIS — E039 Hypothyroidism, unspecified: Secondary | ICD-10-CM

## 2022-03-08 DIAGNOSIS — N1831 Chronic kidney disease, stage 3a: Secondary | ICD-10-CM

## 2022-03-10 ENCOUNTER — Telehealth: Payer: Self-pay | Admitting: Pulmonary Disease

## 2022-03-10 NOTE — Telephone Encounter (Signed)
Dr. Devona Konig

## 2022-04-13 ENCOUNTER — Ambulatory Visit (INDEPENDENT_AMBULATORY_CARE_PROVIDER_SITE_OTHER): Payer: BC Managed Care – PPO | Admitting: Pulmonary Disease

## 2022-04-13 ENCOUNTER — Encounter: Payer: Self-pay | Admitting: Pulmonary Disease

## 2022-04-13 VITALS — BP 124/68 | HR 90 | Temp 98.4°F | Wt 177.0 lb

## 2022-04-13 DIAGNOSIS — R413 Other amnesia: Secondary | ICD-10-CM | POA: Diagnosis not present

## 2022-04-13 DIAGNOSIS — J471 Bronchiectasis with (acute) exacerbation: Secondary | ICD-10-CM | POA: Diagnosis not present

## 2022-04-13 MED ORDER — AMOXICILLIN-POT CLAVULANATE 875-125 MG PO TABS: 1.0000 | ORAL_TABLET | Freq: Two times a day (BID) | ORAL | 0 refills | Status: AC

## 2022-04-13 MED ORDER — DOXYCYCLINE HYCLATE 100 MG PO TABS: 100.0000 mg | ORAL_TABLET | Freq: Two times a day (BID) | ORAL | 0 refills | Status: AC

## 2022-04-13 NOTE — Progress Notes (Signed)
Synopsis: Referred in November 2018 for bronchiectasis by Wardell Honour, MD.  Previously patient of Dr. Lake Bells and Dr. Carlis Abbott.  Subjective:   PATIENT ID: Robert [name]. GENDER: male DOB: 25-Dec-1928, MRN: [id number]  Chief Complaint  Patient presents with   Follow-up    Pt is here for follow up for bronchiectasis. Pt states that his mucus is more thick and dark in color. Pt is currently on trelegy and he states that it does help. And he has Albuterol as needed.     [name] is 88 y.o. gentleman who presents for follow-up.  He is accompanied today by his wife.  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    Overall doing okay.  He reports adherence with Trelegy.  He had low-grade fever and cough that worsened early July 2023.  He took his course of Augmentin twice daily for 10 days for keep on hand.  This did clear up the cough.  However over the last few days cough seems worse.  Low-grade fever over the weekend although this has abated.  But cough has persisted.  Wife expresses concern for memory loss issues.      Past Medical History:  Diagnosis Date   Abnormal CT of spine 10/24/2018   Per [facility] patient packet   Atrial fibrillation ([place])    Back pain    Per [place] new patient packet   Bradycardia    Per [facility] new patient packet   Bronchiectasis [facility])    Per [place] new patient packet   COPD, mild ([place])    Per [place] new patient packet   Hx of colonoscopy 11/14/2007   Per [place] new patient packet   Hx of CT scan of chest 08/23/2018   Per [place] new patient packet   Hypothyroidism    Lung cancer [facility])    Per [place] new patient packet   Neck pain    Per [place] new patient packet   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[facility])    s/p XRT and surgical removal 1990   Pneumonia 2015   Symptomatic bradycardia    s/p PPM 1995     Family History  Problem Relation Age of Onset   Arrhythmia Father        Atrial fibrillation   Diabetes Brother         Insulin dependent   Breast cancer Sister    Breast cancer Sister      Past Surgical History:  Procedure Laterality Date   CATARACT EXTRACTION, BILATERAL     COLONOSCOPY  11/14/2007   Per [place] new patient packet   LUNG REMOVAL, PARTIAL     PACEMAKER [place] N/A 07/23/2018   Procedure: PPM GENERATOR CHANGEOUT;  Surgeon: Evans Lance, MD;  Location: [place] CV LAB;  Service: Cardiovascular;  Laterality: N/A;    Social History   Socioeconomic History   Marital status: Married    Spouse name: Not on file   Number of children: 2   Years of education: Not on file   Highest education level: Not on file  Occupational History   Occupation: Retired  Tobacco Use   Smoking status: Former    Packs/day: 1.00    Years: 40.00    Total pack years: 40.00    Types: Cigarettes    Quit date: 09/05/1988    Years since quitting: 33.6   Smokeless tobacco: Never  Vaping Use  Vaping Use: Never used  Substance and Sexual Activity   Alcohol use: Yes    Alcohol/week: 7.0 standard drinks of alcohol    Types: 7 Standard drinks or equivalent per week    Comment: 1 drink daily   Drug use: No   Sexual activity: Not on file  Other Topics Concern   Not on file  Social History Narrative   Diet      Do you drink/eat things with caffeine Yes      Marital Status Married What year were you married? 1957      Do you live in a house, apartment, assisted living, condo, trailer, etc.? Apartment toenhome      Is it one or more stories? One      How many persons live in your home? 2         Do you have any pets in your home?(please list) No      Highest level of education completed: Bachelors degree      Current or past profession: Psychologist, sport and exercise      Do you exercise?: Some    Type and how often: Various      Do you have a Living Will? No      Do you have a DNR form?         If not, would you like to discuss one?       Do you have signed POA/HPOA forms?  No      Do you have difficulty bathing or dressing yourself? No      Do you have difficulty preparing food or eating? No      Do you have difficulty managing medications? No      Do you have difficulty managing your finances? No      Do you have difficulty affording your medications? No                  Social Determinants of Health   Financial Resource Strain: Not on file  Food Insecurity: Not on file  Transportation Needs: Not on file  Physical Activity: Not on file  Stress: Not on file  Social Connections: Not on file  Intimate Partner Violence: Not on file     No Known Allergies   Immunization History  Administered Date(s) Administered   Influenza Split 06/06/2017   Influenza, High Dose Seasonal PF 07/06/2019, 06/17/2020, 06/21/2021   Influenza,inj,quad, With Preservative 06/05/2018   PFIZER(Purple Top)SARS-COV-2 Vaccination 09/29/2019, 10/21/2019   Pneumococcal Conjugate-13 06/15/2016   Pneumococcal-Unspecified 03/11/2019    Outpatient Medications Prior to Visit  Medication Sig Dispense Refill   albuterol (PROVENTIL) (2.5 MG/3ML) 0.083% nebulizer solution USE 1 VIAL IN NEBULIZER EVERY 6 HOURS AS NEEDED FOR WHEEZING FOR SHORTNESS OF BREATH 900 mL 5   albuterol (VENTOLIN HFA) 108 (90 Base) MCG/ACT inhaler Inhale 2 puffs into the lungs every 6 (six) hours as needed for wheezing or shortness of breath. 8 g 6   Cholecalciferol (VITAMIN D) 50 MCG (2000 UT) tablet Take 2,000 Units by mouth daily.     docusate sodium (COLACE) 100 MG capsule Take 100 mg by mouth 2 (two) times daily.     furosemide (LASIX) 40 MG tablet TAKE 1 TABLET BY MOUTH EVERY DAY 90 tablet 0   HYDROcodone-acetaminophen (NORCO) 10-325 MG tablet 3 (three) times daily as needed.     levothyroxine (SYNTHROID) 125 MCG tablet TAKE 1 TABLET BY MOUTH EVERY DAY 90 tablet 3   Magnesium 500 MG CAPS Take by mouth.  Multiple Vitamin (MULTIVITAMIN) tablet Take 1 tablet by mouth daily.     psyllium (REGULOID)  0.52 g capsule Take 0.52 g by mouth daily.     TRELEGY ELLIPTA 100-62.5-25 MCG/ACT AEPB Inhale 1 puff into the lungs daily. 60 each 11   vitamin C (ASCORBIC ACID) 500 MG tablet Take 500 mg by mouth daily.     XARELTO 15 MG TABS tablet TAKE 1 TABLET BY MOUTH EVERY DAY WITH SUPPER 90 tablet 1   No facility-administered medications prior to visit.      Objective:   Vitals:   04/13/22 1000  BP: 124/68  Pulse: 90  Temp: 98.4 F (36.9 C)  TempSrc: Oral  SpO2: 94%  Weight: 177 lb (80.3 kg)    94% on  RA BMI Readings from Last 3 Encounters:  04/13/22 24.01 kg/m  02/21/22 24.55 kg/m  01/11/22 23.92 kg/m   Wt Readings from Last 3 Encounters:  04/13/22 177 lb (80.3 kg)  02/21/22 181 lb (82.1 kg)  01/11/22 176 lb 6.4 oz (80 kg)    Physical examination: General: Well-appearing, no acute distress Respiratory: Clear to auscultation bilaterally, no crackles or wheeze Cardiovascular: Regular rate and rhythm, no murmurs   CBC    Component Value Date/Time   WBC 5.5 10/13/2020 0700   RBC 3.83 (L) 10/13/2020 0700   HGB 13.1 (L) 10/13/2020 0700   HGB 14.0 07/10/2018 1135   HCT 39.0 10/13/2020 0700   HCT 41.0 07/10/2018 1135   PLT 126 (L) 10/13/2020 0700   PLT 150 07/10/2018 1135   MCV 101.8 (H) 10/13/2020 0700   MCV 97 07/10/2018 1135   MCH 34.2 (H) 10/13/2020 0700   MCHC 33.6 10/13/2020 0700   RDW 12.5 10/13/2020 0700   RDW 12.5 07/10/2018 1135   LYMPHSABS 1,799 10/13/2020 0700   LYMPHSABS 1.3 11/23/2016 1326   MONOABS 1.2 (H) 07/03/2020 1225   EOSABS 149 10/13/2020 0700   EOSABS 0.2 11/23/2016 1326   BASOSABS 50 10/13/2020 0700   BASOSABS 0.0 11/23/2016 1326    Micro: 08/07/2017 AFB sputum-negative 08/07/2017 respiratory-normal flora 06/29/2020: OP flora  Chest Imaging- films reviewed and as per EMR Pulmonary Functions Testing Results: Spirometry 2018 reviewed and interpreted as suggestive of mild restriction vs gas trapping      Assessment & Plan:      ICD-10-CM   1. Memory loss  R41.3 Ambulatory referral to Neuropsychology    2. Bronchiectasis with (acute) exacerbation (HCC)  J47.1       Lung Nodule: Right middle lobe infiltrate favored to be atelectasis with scarring in the setting of recurrent pneumonia/exacerbations.  Some concern for lung cancer given his history of the same.  PET scan 10/2021 for further characterization overall reassuring.  After shared decision making, no further imaging needed.  Multilobar bronchiectasis with acute exacerbation-possibly related to recurrent aspiration given distribution lower lobes.  Responds to steroids, Augmentin.  Worsening cough over the last 3 days with low-grade fever.  Recent Augmentin course, will trial different antibiotic given recency of Augmentin. 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 - Continue Trelegy  History of lung cancer- [facility] pancoast tumor.  Previous tobacco abuse before quitting in 1990. -No concerning symptoms   RTC in 3 months with Dr. Silas Flood.  Lanier Clam, MD

## 2022-04-13 NOTE — Patient Instructions (Signed)
Nice to see you again  Take doxycycline 100 mg tablet twice a day for 10 days, let see if this helps with the cough  I sent a prescription for Augmentin 1 tablet twice a day for 10 days to have on hand in case the cough recurs before you see me next  I sent a referral to neurology today  Return to clinic in 3 months or sooner if needed with Dr. Silas Flood

## 2022-04-28 ENCOUNTER — Telehealth: Payer: Self-pay | Admitting: Pulmonary Disease

## 2022-04-28 NOTE — Telephone Encounter (Signed)
Called wife and she states that Parv is not feeling well and has been like this for a week. Saturday was his last dose of doxycycline. And on Sunday he started with no appetite. Tempeture was noted to be 100.1. And wife states that this has been going on all week long. Cough is also productive. Mucus is light brown.   Covid test was negative yesterday.   Doing his nebs and Trelegy inhaler as directed.  Augmentin is on hand. But he wanted to make sure its ok to start it since finishing the Doxy on Saturday.    Was verbally informed by Dr Silas Flood that it was ok to start the Augmentin. And to give [place] a call next week for [place] to see how he is doing. Wife verbalized understanding. Nothing further needed

## 2022-05-03 ENCOUNTER — Telehealth: Payer: Self-pay | Admitting: *Deleted

## 2022-05-03 DIAGNOSIS — E039 Hypothyroidism, unspecified: Secondary | ICD-10-CM

## 2022-05-03 DIAGNOSIS — N1831 Chronic kidney disease, stage 3a: Secondary | ICD-10-CM

## 2022-05-03 DIAGNOSIS — I4819 Other persistent atrial fibrillation: Secondary | ICD-10-CM

## 2022-05-03 NOTE — Addendum Note (Signed)
Addended by: Georgina Snell on: 05/03/2022 04:29 PM   Modules accepted: Orders

## 2022-05-03 NOTE — Telephone Encounter (Signed)
Patient has a 6 month follow up with you on 06/15/2022 at [facility].   Wife is wanting to know if he needs labs before his appointment and if so if you would place orders for him to get them.   Please Advise.

## 2022-05-03 NOTE — Telephone Encounter (Addendum)
Please schedule him for Labs before his visit with me . I am placing orders for him. He will need a call back to let him know

## 2022-05-03 NOTE — Telephone Encounter (Signed)
Appointments were scheduled today by Ronni Rumble a message to schedule patient's for [facility] not [facility].

## 2022-05-03 NOTE — Telephone Encounter (Signed)
Patient has been seeing Dr Sabra Heck as he lives in [place]. I will let him follow with him. We need to cancel this appointment Unless he has any acute issue

## 2022-05-03 NOTE — Telephone Encounter (Signed)
Santiago Glad, receptionist, stated that both patient and wife requested to see you at [facility].   They called and canceled their appointments with Dr. Sabra Heck and requested to see you.

## 2022-05-04 ENCOUNTER — Ambulatory Visit (INDEPENDENT_AMBULATORY_CARE_PROVIDER_SITE_OTHER): Payer: BC Managed Care – PPO

## 2022-05-04 ENCOUNTER — Telehealth: Payer: Self-pay | Admitting: Pulmonary Disease

## 2022-05-04 DIAGNOSIS — I495 Sick sinus syndrome: Secondary | ICD-10-CM | POA: Diagnosis not present

## 2022-05-04 LAB — CUP PACEART REMOTE DEVICE CHECK
Battery Remaining Longevity: 60 mo
Battery Voltage: 2.97 V
Brady Statistic RV Percent Paced: 92.34 %
Date Time Interrogation Session: 20230828225704
Implantable Lead Implant Date: 19950526
Implantable Lead Implant Date: 19950526
Implantable Lead Location: 753859
Implantable Lead Location: 753860
Implantable Lead Serial Number: 55140
Implantable Lead Serial Number: 55516
Implantable Pulse Generator Implant Date: 20191118
Lead Channel Impedance Value: 247 Ohm
Lead Channel Impedance Value: 380 Ohm
Lead Channel Pacing Threshold Amplitude: 2.125 V
Lead Channel Pacing Threshold Pulse Width: 0.4 ms
Lead Channel Sensing Intrinsic Amplitude: 4.875 mV
Lead Channel Sensing Intrinsic Amplitude: 4.875 mV
Lead Channel Setting Pacing Amplitude: 2.5 V
Lead Channel Setting Pacing Pulse Width: 1 ms
Lead Channel Setting Sensing Sensitivity: 1.2 mV

## 2022-05-04 NOTE — Telephone Encounter (Signed)
Patients wife was calling to give [place] an update. Patient is doing much better. He has not had a fever in 3 days and his appetite is returning.   Just an FYI sir.

## 2022-05-06 NOTE — Telephone Encounter (Signed)
Spoke with patients spouse and scheduled fasting lab appointment for 06/09/22 @ 7:15 am for [facility] as that is where the reside.

## 2022-05-10 ENCOUNTER — Other Ambulatory Visit: Payer: Self-pay | Admitting: Family Medicine

## 2022-05-10 ENCOUNTER — Other Ambulatory Visit: Payer: Self-pay | Admitting: Internal Medicine

## 2022-05-27 NOTE — Progress Notes (Signed)
Remote pacemaker transmission.   

## 2022-06-08 ENCOUNTER — Encounter: Payer: Medicare Other | Admitting: Family Medicine

## 2022-06-09 ENCOUNTER — Other Ambulatory Visit: Payer: Medicare Other

## 2022-06-09 DIAGNOSIS — E039 Hypothyroidism, unspecified: Secondary | ICD-10-CM

## 2022-06-09 DIAGNOSIS — I4819 Other persistent atrial fibrillation: Secondary | ICD-10-CM

## 2022-06-09 DIAGNOSIS — N1831 Chronic kidney disease, stage 3a: Secondary | ICD-10-CM

## 2022-06-09 LAB — COMPLETE METABOLIC PANEL WITH GFR
AG Ratio: 1.1 (calc) (ref 1.0–2.5)
ALT: 14 U/L (ref 9–46)
AST: 24 U/L (ref 10–35)
Albumin: 3.9 g/dL (ref 3.6–5.1)
Alkaline phosphatase (APISO): 76 U/L (ref 35–144)
BUN/Creatinine Ratio: 23 (calc) — ABNORMAL HIGH (ref 6–22)
BUN: 34 mg/dL — ABNORMAL HIGH (ref 7–25)
CO2: 25 mmol/L (ref 20–32)
Calcium: 9.3 mg/dL (ref 8.6–10.3)
Chloride: 102 mmol/L (ref 98–110)
Creat: 1.5 mg/dL — ABNORMAL HIGH (ref 0.70–1.22)
Globulin: 3.6 g/dL (calc) (ref 1.9–3.7)
Glucose, Bld: 85 mg/dL (ref 65–99)
Potassium: 4.4 mmol/L (ref 3.5–5.3)
Sodium: 137 mmol/L (ref 135–146)
Total Bilirubin: 1 mg/dL (ref 0.2–1.2)
Total Protein: 7.5 g/dL (ref 6.1–8.1)
eGFR: 44 mL/min/{1.73_m2} — ABNORMAL LOW (ref >=60)

## 2022-06-09 LAB — CBC WITH DIFFERENTIAL/PLATELET
Absolute Monocytes: 691 cells/uL (ref 200–950)
Basophils Absolute: 39 cells/uL (ref 0–200)
Basophils Relative: 0.8 %
Eosinophils Absolute: 270 cells/uL (ref 15–500)
Eosinophils Relative: 5.5 %
HCT: 31.4 % — ABNORMAL LOW (ref 38.5–50.0)
Hemoglobin: 10.7 g/dL — ABNORMAL LOW (ref 13.2–17.1)
Lymphs Abs: 1539 cells/uL (ref 850–3900)
MCH: 33.3 pg — ABNORMAL HIGH (ref 27.0–33.0)
MCHC: 34.1 g/dL (ref 32.0–36.0)
MCV: 97.8 fL (ref 80.0–100.0)
MPV: 10.5 fL (ref 7.5–12.5)
Monocytes Relative: 14.1 %
Neutro Abs: 2362 cells/uL (ref 1500–7800)
Neutrophils Relative %: 48.2 %
Platelets: 119 10*3/uL — ABNORMAL LOW (ref 140–400)
RBC: 3.21 10*6/uL — ABNORMAL LOW (ref 4.20–5.80)
RDW: 15.4 % — ABNORMAL HIGH (ref 11.0–15.0)
Total Lymphocyte: 31.4 %
WBC: 4.9 10*3/uL (ref 3.8–10.8)

## 2022-06-09 LAB — TSH: TSH: 1.35 mIU/L (ref 0.40–4.50)

## 2022-06-15 ENCOUNTER — Non-Acute Institutional Stay: Payer: BC Managed Care – PPO | Admitting: Internal Medicine

## 2022-06-15 ENCOUNTER — Encounter: Payer: Self-pay | Admitting: Internal Medicine

## 2022-06-15 VITALS — BP 126/78 | HR 81 | Temp 97.0°F | Ht 72.0 in | Wt 169.7 lb

## 2022-06-15 DIAGNOSIS — N1832 Chronic kidney disease, stage 3b: Secondary | ICD-10-CM | POA: Diagnosis not present

## 2022-06-15 DIAGNOSIS — E039 Hypothyroidism, unspecified: Secondary | ICD-10-CM | POA: Diagnosis not present

## 2022-06-15 DIAGNOSIS — R4189 Other symptoms and signs involving cognitive functions and awareness: Secondary | ICD-10-CM

## 2022-06-15 DIAGNOSIS — R609 Edema, unspecified: Secondary | ICD-10-CM

## 2022-06-15 DIAGNOSIS — G8929 Other chronic pain: Secondary | ICD-10-CM

## 2022-06-15 DIAGNOSIS — I5032 Chronic diastolic (congestive) heart failure: Secondary | ICD-10-CM

## 2022-06-15 DIAGNOSIS — D631 Anemia in chronic kidney disease: Secondary | ICD-10-CM

## 2022-06-15 DIAGNOSIS — I4819 Other persistent atrial fibrillation: Secondary | ICD-10-CM

## 2022-06-15 DIAGNOSIS — J471 Bronchiectasis with (acute) exacerbation: Secondary | ICD-10-CM

## 2022-06-15 DIAGNOSIS — K5903 Drug induced constipation: Secondary | ICD-10-CM

## 2022-06-15 DIAGNOSIS — K219 Gastro-esophageal reflux disease without esophagitis: Secondary | ICD-10-CM

## 2022-06-15 DIAGNOSIS — M545 Low back pain, unspecified: Secondary | ICD-10-CM

## 2022-06-15 NOTE — Progress Notes (Signed)
Location:  [place] of Service:  Clinic (12)  Provider:   Code Status:  Goals of Care:     06/15/2022    1:10 PM  Advanced Directives  Does Patient Have a Medical Advance Directive? Yes  Type of Advance Directive [place]  Does patient want to make changes to medical advance directive? No - Patient declined  Copy of [place] in Chart? No - copy requested     Chief Complaint  Patient presents with   Medical Management of Chronic Issues    6 Month Follow up.    Quality Metric Gaps    To discuss need for tdap,shingrix,pneumonia and Flu or postpone if patient refuses. NCIR Verified.     HPI: Patient is a 88 y.o. male seen today for medical management of chronic diseases.    Patient lives in [place] with is wife in [place]  Has history of Multilobar Bronchiectasis, h/o RUL Pancoast Tumor s/p Lobectomy, Follows with Pulmonology Gets Recurrent Pneumonia and takes Augmentin which usually helps him   History of PAF, S/P PPM On Xarelto also history of Diastolic CHF doing well on Lasix Follows with Cardiology  Chronic Back pain Also history of recent T 7 Fracture and old C7 and T2 Fracture On Chronic Opioids Follows with Dr Nelva Bush  Mild Cognitive Impairment Per wife but very high functional Still drives Staying independent in his ADLS  Constipation due to  [place]  CKD Some worsening recently Anemia which is also worsening Denies noticing any blood in stools  Walking well but does not walk much stays in his apartment most of the time Has lost some weight  Per wife having some reflux like symptoms and coughs especially when he is drinking Liquid      Past Medical History:  Diagnosis Date   Abnormal CT of spine 10/24/2018   Per [place] new patient packet   Atrial fibrillation ([place])    Back pain    Per [place] new patient packet   Bradycardia    Per [place] new patient packet   Bronchiectasis [facility])     Per [place] new patient packet   COPD, mild ([place])    Per [place] new patient packet   Hx of colonoscopy 11/14/2007   Per [place] new patient packet   Hx of CT scan of chest 08/23/2018   Per [employer] new patient packet   Hypothyroidism    Lung cancer [facility])    Per [place] new patient packet   Neck pain    Per [place] new patient packet   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[facility])    s/p XRT and surgical removal 1990   Pneumonia 2015   Symptomatic bradycardia    s/p PPM 1995    Past Surgical History:  Procedure Laterality Date   CATARACT EXTRACTION, BILATERAL     COLONOSCOPY  11/14/2007   Per [place] new patient packet   LUNG REMOVAL, PARTIAL     PACEMAKER [place] N/A 07/23/2018   Procedure: PPM GENERATOR CHANGEOUT;  Surgeon: Evans Lance, MD;  Location: [place] [facility];  Service: Cardiovascular;  Laterality: N/A;    No Known Allergies  Outpatient Encounter Medications as of 06/15/2022  Medication Sig   albuterol (PROVENTIL) (2.5 MG/3ML) 0.083% nebulizer solution USE 1 VIAL IN NEBULIZER EVERY 6 HOURS AS NEEDED FOR WHEEZING FOR SHORTNESS OF BREATH   albuterol (VENTOLIN HFA) 108 (90 Base) MCG/ACT inhaler Inhale 2  puffs into the lungs every 6 (six) hours as needed for wheezing or shortness of breath.   Cholecalciferol (VITAMIN D) 50 MCG (2000 UT) tablet Take 2,000 Units by mouth daily.   docusate sodium (COLACE) 100 MG capsule Take 100 mg by mouth 2 (two) times daily.   furosemide (LASIX) 40 MG tablet Take 1 tablet (40 mg total) by mouth daily.   HYDROcodone-acetaminophen (NORCO) 10-325 MG tablet 3 (three) times daily as needed.   levothyroxine (SYNTHROID) 125 MCG tablet TAKE 1 TABLET BY MOUTH EVERY DAY   Magnesium 500 MG CAPS Take by mouth.   Multiple Vitamin (MULTIVITAMIN) tablet Take 1 tablet by mouth daily.   psyllium (REGULOID) 0.52 g capsule Take 0.52 g by mouth daily.   TRELEGY ELLIPTA 100-62.5-25 MCG/ACT AEPB Inhale 1 puff into the lungs daily.   vitamin C (ASCORBIC ACID) 500 MG  tablet Take 500 mg by mouth daily.   XARELTO 15 MG TABS tablet TAKE 1 TABLET BY MOUTH EVERY DAY WITH SUPPER   No facility-administered encounter medications on file as of 06/15/2022.    Review of Systems:  Review of Systems  Constitutional:  Negative for activity change, appetite change and unexpected weight change.  HENT: Negative.    Respiratory:  Positive for cough. Negative for shortness of breath.   Cardiovascular:  Positive for leg swelling.  Gastrointestinal:  Positive for constipation.  Genitourinary:  Negative for frequency.  Musculoskeletal:  Positive for back pain. Negative for arthralgias, gait problem and myalgias.  Skin: Negative.  Negative for rash.  Neurological:  Negative for dizziness and weakness.  Psychiatric/Behavioral:  Positive for confusion. Negative for sleep disturbance.   All other systems reviewed and are negative.   Health Maintenance  Topic Date Due   TETANUS/TDAP  Never done   Zoster Vaccines- Shingrix (1 of 2) Never done   Pneumonia Vaccine 73+ Years old (2 - PPSV23 or PCV20) 05/06/2019   INFLUENZA VACCINE  04/05/2022   COVID-19 Vaccine  Completed   HPV VACCINES  Aged Out    Physical Exam: Vitals:   06/15/22 1308  BP: 126/78  Pulse: 81  Temp: (!) 97 F (36.1 C)  SpO2: 95%  Weight: 169 lb 11.2 oz (77 kg)  Height: 6' (1.829 m)   Body mass index is 23.02 kg/m. Physical Exam Vitals reviewed.  Constitutional:      Appearance: Normal appearance.  HENT:     Head: Normocephalic.     Nose: Nose normal.     Mouth/Throat:     Mouth: Mucous membranes are moist.     Pharynx: Oropharynx is clear.  Eyes:     Pupils: Pupils are equal, round, and reactive to light.  Cardiovascular:     Rate and Rhythm: Normal rate and regular rhythm.     Pulses: Normal pulses.     Heart sounds: No murmur heard. Pulmonary:     Effort: Pulmonary effort is normal. No respiratory distress.     Breath sounds: Rales present.  Abdominal:     General: Abdomen is  flat. Bowel sounds are normal.     Palpations: Abdomen is soft.  Musculoskeletal:        General: Swelling present.     Cervical back: Neck supple.  Skin:    General: Skin is warm.  Neurological:     General: No focal deficit present.     Mental Status: He is alert.     Comments: Struggles with his words  Psychiatric:  Mood and Affect: Mood normal.        Thought Content: Thought content normal.       No data to display          Labs reviewed: Basic Metabolic Panel: Recent Labs    09/02/21 0705 06/09/22 0713  NA  --  137  K  --  4.4  CL  --  102  CO2  --  25  GLUCOSE  --  85  BUN  --  34*  CREATININE  --  1.50*  CALCIUM  --  9.3  TSH 2.19 1.35   Liver Function Tests: Recent Labs    06/09/22 0713  AST 24  ALT 14  BILITOT 1.0  PROT 7.5   No results for input(s): "LIPASE", "AMYLASE" in the last 8760 hours. No results for input(s): "AMMONIA" in the last 8760 hours. CBC: Recent Labs    06/09/22 0713  WBC 4.9  NEUTROABS 2,362  HGB 10.7*  HCT 31.4*  MCV 97.8  PLT 119*   Lipid Panel: No results for input(s): "CHOL", "HDL", "LDLCALC", "TRIG", "CHOLHDL", "LDLDIRECT" in the last 8760 hours. No results found for: "HGBA1C"  Procedures since last visit: No results found.  Assessment/Plan 1. Stage 3b chronic kidney disease (HCC) Repeat BMP If any worsening will discuss Renal referal They want to wait due to his age and fraility  2. Anemia due to stage 3b chronic kidney disease (HCC) Iron studies and B 12 levels  3. Hypothyroidism, unspecified type TSH normal in 10/23  4. Persistent atrial fibrillation (HCC) On Xarelto   5. Bronchiectasis with (acute) exacerbation ([place]) Follows with Pulmonary  On Inhalers Also Takes Antibiotics when Cough becomes constant  6. Chronic diastolic heart failure (HCC) On Lasix Looks Euvolemic  7. Peripheral edema Mild Ted hoses are helping  8. Drug-induced constipation Add miralax and Senakot as  needed  9. Chronic midline low back pain without sciatica On Norco per Dr Nelva Bush  10. Cognitive impairment Check MMSE next visit Wants to wait for his Detail Neuropsychology exam as he does not want to go through 4 hour appointment He is still driving short distances with help of his wife 64 GERD She will try some OTC meds for her Reflux  Labs/tests ordered:  * No order type specified * Next appt:  Visit date not found

## 2022-06-15 NOTE — Patient Instructions (Addendum)
Miralax half scoop if needed Senna Plus Colace can take one tablet to 2 tablets at night Do not take magnesium everyday can take it every other day He does need Prevnar 20 and RSV vaccine

## 2022-07-04 ENCOUNTER — Emergency Department (HOSPITAL_COMMUNITY): Payer: Medicare Other

## 2022-07-04 ENCOUNTER — Encounter (HOSPITAL_COMMUNITY): Payer: Self-pay

## 2022-07-04 ENCOUNTER — Inpatient Hospital Stay (HOSPITAL_COMMUNITY): Payer: Medicare Other

## 2022-07-04 ENCOUNTER — Other Ambulatory Visit: Payer: Self-pay

## 2022-07-04 ENCOUNTER — Ambulatory Visit: Payer: BC Managed Care – PPO | Admitting: Pulmonary Disease

## 2022-07-04 ENCOUNTER — Inpatient Hospital Stay (HOSPITAL_COMMUNITY)
Admission: EM | Admit: 2022-07-04 | Discharge: 2022-07-08 | DRG: 193 | Disposition: A | Discharge status: Home Health | Payer: Medicare Other | Attending: Internal Medicine | Admitting: Internal Medicine

## 2022-07-04 ENCOUNTER — Encounter: Payer: Self-pay | Admitting: Pulmonary Disease

## 2022-07-04 VITALS — BP 106/72 | HR 91 | Ht 72.0 in | Wt 169.0 lb

## 2022-07-04 DIAGNOSIS — R230 Cyanosis: Secondary | ICD-10-CM | POA: Diagnosis present

## 2022-07-04 DIAGNOSIS — J90 Pleural effusion, not elsewhere classified: Secondary | ICD-10-CM

## 2022-07-04 DIAGNOSIS — Z9841 Cataract extraction status, right eye: Secondary | ICD-10-CM

## 2022-07-04 DIAGNOSIS — Z833 Family history of diabetes mellitus: Secondary | ICD-10-CM | POA: Diagnosis not present

## 2022-07-04 DIAGNOSIS — Z7901 Long term (current) use of anticoagulants: Secondary | ICD-10-CM

## 2022-07-04 DIAGNOSIS — Z85118 Personal history of other malignant neoplasm of bronchus and lung: Secondary | ICD-10-CM

## 2022-07-04 DIAGNOSIS — R1310 Dysphagia, unspecified: Secondary | ICD-10-CM | POA: Diagnosis present

## 2022-07-04 DIAGNOSIS — J9811 Atelectasis: Secondary | ICD-10-CM | POA: Diagnosis present

## 2022-07-04 DIAGNOSIS — J9601 Acute respiratory failure with hypoxia: Secondary | ICD-10-CM | POA: Diagnosis present

## 2022-07-04 DIAGNOSIS — E039 Hypothyroidism, unspecified: Secondary | ICD-10-CM | POA: Diagnosis present

## 2022-07-04 DIAGNOSIS — Z902 Acquired absence of lung [part of]: Secondary | ICD-10-CM

## 2022-07-04 DIAGNOSIS — I480 Paroxysmal atrial fibrillation: Secondary | ICD-10-CM | POA: Diagnosis present

## 2022-07-04 DIAGNOSIS — N1832 Chronic kidney disease, stage 3b: Secondary | ICD-10-CM

## 2022-07-04 DIAGNOSIS — J440 Chronic obstructive pulmonary disease with acute lower respiratory infection: Secondary | ICD-10-CM | POA: Diagnosis present

## 2022-07-04 DIAGNOSIS — Z923 Personal history of irradiation: Secondary | ICD-10-CM | POA: Diagnosis not present

## 2022-07-04 DIAGNOSIS — N179 Acute kidney failure, unspecified: Secondary | ICD-10-CM

## 2022-07-04 DIAGNOSIS — Z20822 Contact with and (suspected) exposure to covid-19: Secondary | ICD-10-CM | POA: Diagnosis present

## 2022-07-04 DIAGNOSIS — I442 Atrioventricular block, complete: Secondary | ICD-10-CM | POA: Diagnosis present

## 2022-07-04 DIAGNOSIS — J189 Pneumonia, unspecified organism: Principal | ICD-10-CM | POA: Diagnosis present

## 2022-07-04 DIAGNOSIS — J449 Chronic obstructive pulmonary disease, unspecified: Secondary | ICD-10-CM

## 2022-07-04 DIAGNOSIS — J479 Bronchiectasis, uncomplicated: Secondary | ICD-10-CM | POA: Diagnosis not present

## 2022-07-04 DIAGNOSIS — J471 Bronchiectasis with (acute) exacerbation: Secondary | ICD-10-CM | POA: Diagnosis present

## 2022-07-04 DIAGNOSIS — D539 Nutritional anemia, unspecified: Secondary | ICD-10-CM

## 2022-07-04 DIAGNOSIS — I4891 Unspecified atrial fibrillation: Secondary | ICD-10-CM | POA: Diagnosis present

## 2022-07-04 DIAGNOSIS — Z6821 Body mass index (BMI) 21.0-21.9, adult: Secondary | ICD-10-CM

## 2022-07-04 DIAGNOSIS — E43 Unspecified severe protein-calorie malnutrition: Secondary | ICD-10-CM | POA: Insufficient documentation

## 2022-07-04 DIAGNOSIS — Z79899 Other long term (current) drug therapy: Secondary | ICD-10-CM

## 2022-07-04 DIAGNOSIS — Z8701 Personal history of pneumonia (recurrent): Secondary | ICD-10-CM

## 2022-07-04 DIAGNOSIS — J470 Bronchiectasis with acute lower respiratory infection: Secondary | ICD-10-CM | POA: Diagnosis present

## 2022-07-04 DIAGNOSIS — G8929 Other chronic pain: Secondary | ICD-10-CM | POA: Diagnosis present

## 2022-07-04 DIAGNOSIS — Z950 Presence of cardiac pacemaker: Secondary | ICD-10-CM | POA: Diagnosis not present

## 2022-07-04 DIAGNOSIS — Z9842 Cataract extraction status, left eye: Secondary | ICD-10-CM

## 2022-07-04 DIAGNOSIS — I5033 Acute on chronic diastolic (congestive) heart failure: Secondary | ICD-10-CM | POA: Diagnosis present

## 2022-07-04 DIAGNOSIS — Z79890 Hormone replacement therapy (postmenopausal): Secondary | ICD-10-CM

## 2022-07-04 DIAGNOSIS — Z87891 Personal history of nicotine dependence: Secondary | ICD-10-CM

## 2022-07-04 LAB — CBC WITH DIFFERENTIAL/PLATELET
Abs Immature Granulocytes: 0.05 10*3/uL (ref 0.00–0.07)
Basophils Absolute: 0 10*3/uL (ref 0.0–0.1)
Basophils Relative: 0 %
Eosinophils Absolute: 0 10*3/uL (ref 0.0–0.5)
Eosinophils Relative: 0 %
HCT: 35 % — ABNORMAL LOW (ref 39.0–52.0)
Hemoglobin: 11.3 g/dL — ABNORMAL LOW (ref 13.0–17.0)
Immature Granulocytes: 1 %
Lymphocytes Relative: 3 %
Lymphs Abs: 0.3 10*3/uL — ABNORMAL LOW (ref 0.7–4.0)
MCH: 33.4 pg (ref 26.0–34.0)
MCHC: 32.3 g/dL (ref 30.0–36.0)
MCV: 103.6 fL — ABNORMAL HIGH (ref 80.0–100.0)
Monocytes Absolute: 0.8 10*3/uL (ref 0.1–1.0)
Monocytes Relative: 9 %
Neutro Abs: 7.9 10*3/uL — ABNORMAL HIGH (ref 1.7–7.7)
Neutrophils Relative %: 87 %
Platelets: 178 10*3/uL (ref 150–400)
RBC: 3.38 MIL/uL — ABNORMAL LOW (ref 4.22–5.81)
RDW: 17.6 % — ABNORMAL HIGH (ref 11.5–15.5)
WBC: 9 10*3/uL (ref 4.0–10.5)
nRBC: 0 % (ref 0.0–0.2)

## 2022-07-04 LAB — TROPONIN I (HIGH SENSITIVITY)
Troponin I (High Sensitivity): 21 ng/L — ABNORMAL HIGH (ref <18)
Troponin I (High Sensitivity): 18 ng/L — ABNORMAL HIGH (ref <18)

## 2022-07-04 LAB — VITAMIN B12: Vitamin B-12: 1112 pg/mL — ABNORMAL HIGH (ref 180–914)

## 2022-07-04 LAB — COMPREHENSIVE METABOLIC PANEL WITH GFR
ALT: 17 U/L (ref 0–44)
AST: 27 U/L (ref 15–41)
Albumin: 3.4 g/dL — ABNORMAL LOW (ref 3.5–5.0)
Alkaline Phosphatase: 63 U/L (ref 38–126)
Anion gap: 11 (ref 5–15)
BUN: 37 mg/dL — ABNORMAL HIGH (ref 8–23)
CO2: 24 mmol/L (ref 22–32)
Calcium: 9 mg/dL (ref 8.9–10.3)
Chloride: 102 mmol/L (ref 98–111)
Creatinine, Ser: 1.9 mg/dL — ABNORMAL HIGH (ref 0.61–1.24)
GFR, Estimated: 33 mL/min — ABNORMAL LOW
Glucose, Bld: 120 mg/dL — ABNORMAL HIGH (ref 70–99)
Potassium: 3.8 mmol/L (ref 3.5–5.1)
Sodium: 137 mmol/L (ref 135–145)
Total Bilirubin: 1.5 mg/dL — ABNORMAL HIGH (ref 0.3–1.2)
Total Protein: 8 g/dL (ref 6.5–8.1)

## 2022-07-04 LAB — FOLATE: Folate: 33.5 ng/mL (ref >5.9)

## 2022-07-04 LAB — BRAIN NATRIURETIC PEPTIDE: B Natriuretic Peptide: 429.4 pg/mL — ABNORMAL HIGH (ref 0.0–100.0)

## 2022-07-04 LAB — SARS CORONAVIRUS 2 BY RT PCR: SARS Coronavirus 2 by RT PCR: NEGATIVE

## 2022-07-04 MED ORDER — GUAIFENESIN ER 600 MG PO TB12: 600.0000 mg | ORAL_TABLET | Freq: Two times a day (BID) | ORAL | Status: DC

## 2022-07-04 MED ORDER — LEVOTHYROXINE SODIUM 125 MCG PO TABS
125.0000 ug | ORAL_TABLET | Freq: Every day | ORAL | Status: DC
  Administered 2022-07-05 – 2022-07-08 (×4): 125 ug via ORAL
  Filled 2022-07-04 (×4): qty 1

## 2022-07-04 MED ORDER — SODIUM CHLORIDE 0.9 % IV SOLN
500.0000 mg | Freq: Once | INTRAVENOUS | Status: AC
  Administered 2022-07-04: 500 mg via INTRAVENOUS
  Filled 2022-07-04: qty 5

## 2022-07-04 MED ORDER — FUROSEMIDE 10 MG/ML IJ SOLN
40.0000 mg | Freq: Every day | INTRAMUSCULAR | Status: DC
  Administered 2022-07-04 – 2022-07-07 (×4): 40 mg via INTRAVENOUS
  Filled 2022-07-04 (×6): qty 4

## 2022-07-04 MED ORDER — SODIUM CHLORIDE 0.9 % IV SOLN
2.0000 g | INTRAVENOUS | Status: DC
  Administered 2022-07-04 – 2022-07-05 (×2): 2 g via INTRAVENOUS
  Filled 2022-07-04 (×2): qty 12.5

## 2022-07-04 MED ORDER — DOCUSATE SODIUM 100 MG PO CAPS
100.0000 mg | ORAL_CAPSULE | Freq: Two times a day (BID) | ORAL | Status: DC
  Administered 2022-07-04 – 2022-07-08 (×7): 100 mg via ORAL
  Filled 2022-07-04 (×7): qty 1

## 2022-07-04 MED ORDER — UMECLIDINIUM BROMIDE 62.5 MCG/ACT IN AEPB
1.0000 | INHALATION_SPRAY | Freq: Every day | RESPIRATORY_TRACT | Status: DC
  Administered 2022-07-05 – 2022-07-08 (×4): 1 via RESPIRATORY_TRACT
  Filled 2022-07-04: qty 7

## 2022-07-04 MED ORDER — SODIUM CHLORIDE 3 % IN NEBU
4.0000 mL | INHALATION_SOLUTION | Freq: Two times a day (BID) | RESPIRATORY_TRACT | Status: AC
  Administered 2022-07-04 – 2022-07-07 (×6): 4 mL via RESPIRATORY_TRACT
  Filled 2022-07-04 (×6): qty 4

## 2022-07-04 MED ORDER — HYDROCODONE-ACETAMINOPHEN 10-325 MG PO TABS
1.0000 | ORAL_TABLET | Freq: Three times a day (TID) | ORAL | Status: DC | PRN
  Administered 2022-07-04 – 2022-07-08 (×5): 1 via ORAL
  Filled 2022-07-04 (×6): qty 1

## 2022-07-04 MED ORDER — GUAIFENESIN ER 600 MG PO TB12
1200.0000 mg | ORAL_TABLET | Freq: Two times a day (BID) | ORAL | Status: DC
  Administered 2022-07-04 – 2022-07-08 (×7): 1200 mg via ORAL
  Filled 2022-07-04 (×7): qty 2

## 2022-07-04 MED ORDER — ACETAMINOPHEN 650 MG RE SUPP: 650.0000 mg | Freq: Four times a day (QID) | RECTAL | Status: DC | PRN

## 2022-07-04 MED ORDER — SODIUM CHLORIDE 0.9 % IV SOLN
1.0000 g | Freq: Once | INTRAVENOUS | Status: AC
  Administered 2022-07-04: 1 g via INTRAVENOUS
  Filled 2022-07-04: qty 10

## 2022-07-04 MED ORDER — FLUTICASONE FUROATE-VILANTEROL 100-25 MCG/ACT IN AEPB
1.0000 | INHALATION_SPRAY | Freq: Every day | RESPIRATORY_TRACT | Status: DC
  Administered 2022-07-05 – 2022-07-08 (×4): 1 via RESPIRATORY_TRACT
  Filled 2022-07-04: qty 28

## 2022-07-04 MED ORDER — ONDANSETRON HCL 4 MG/2ML IJ SOLN: 4.0000 mg | Freq: Four times a day (QID) | INTRAMUSCULAR | Status: DC | PRN

## 2022-07-04 MED ORDER — ONDANSETRON HCL 4 MG PO TABS: 4.0000 mg | ORAL_TABLET | Freq: Four times a day (QID) | ORAL | Status: DC | PRN

## 2022-07-04 MED ORDER — IPRATROPIUM-ALBUTEROL 0.5-2.5 (3) MG/3ML IN SOLN: 3.0000 mL | Freq: Four times a day (QID) | RESPIRATORY_TRACT | Status: DC | PRN

## 2022-07-04 MED ORDER — VANCOMYCIN HCL 1500 MG/300ML IV SOLN
1500.0000 mg | INTRAVENOUS | Status: DC
  Administered 2022-07-04: 1500 mg via INTRAVENOUS
  Filled 2022-07-04 (×2): qty 300

## 2022-07-04 MED ORDER — ACETAMINOPHEN 325 MG PO TABS
650.0000 mg | ORAL_TABLET | Freq: Four times a day (QID) | ORAL | Status: DC | PRN
  Administered 2022-07-06 – 2022-07-07 (×2): 650 mg via ORAL
  Filled 2022-07-04 (×3): qty 2

## 2022-07-04 NOTE — Patient Instructions (Signed)
We have called EMS to transport you to the hospital given your low oxygen and poor respiratory status

## 2022-07-04 NOTE — H&P (Signed)
History and Physical    Patient: Robert [name]. QIO:[id number] DOB: Jul 07, 1929 DOA: 07/04/2022 DOS: the patient was seen and examined on 07/04/2022 PCP: [name], MD  Patient coming from:  Pulmonology office  Chief Complaint:  Chief Complaint  Patient presents with   Respiratory Distress   HPI: Robert [name]. is a 88 y.o. male with medical history significant of hypothyroidism, COPD, PAF, CHB s/p PPM, CKD 3b. Presenting with shortness of breath. Over the weekend, he was having increased spells of cough and shortness of breath. He had left axillary pain with cough. His wife noted his O2 sats to be in the 20s and 80s at home. He tried his nebulizers, but they didn't provide complete relief. He was able to see his pulmonologist this morning. When he was seen, he was 82% on RA and cyanotic. It was recommended that he go to the ED for evaluation.   Of note, the wife reports that the patient was on 20 days of abx between doxycycline and Augmentin in August of this year. She also reports that the patient has intermittent episodes of choking on his food. They otherwise denies any other aggravating or alleviating factors.   Review of Systems: As mentioned in the history of present illness. All other systems reviewed and are negative. Past Medical History:  Diagnosis Date   Abnormal CT of spine 10/24/2018   Per [place] new patient packet   Atrial fibrillation ([employer])    Back pain    Per [place] new patient packet   Bradycardia    Per [facility] new patient packet   Bronchiectasis [facility])    Per [place] new patient packet   COPD, mild ([place])    Per [place] new patient packet   Hx of colonoscopy 11/14/2007   Per [place] new patient packet   Hx of CT scan of chest 08/23/2018   Per [place] new patient packet   Hypothyroidism    Lung cancer [facility])    Per [place] new patient packet   Neck pain    Per [place] new patient packet   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 His smoking use included cigarettes. He has a 40.00 pack-year smoking history. He has never used smokeless tobacco. He reports that he does not currently use alcohol after a past usage of about 7.0 standard drinks of alcohol per week. He reports that he does not use drugs.  No Known Allergies  Family History  Problem Relation Age of Onset   Arrhythmia Father        Atrial fibrillation   Diabetes Brother        Insulin dependent   Breast cancer Sister    Breast cancer Sister     Prior to Admission medications   Medication Sig Start Date End Date Taking? Authorizing Provider  albuterol (PROVENTIL) (2.5 MG/3ML) 0.083% nebulizer solution USE 1  VIAL IN NEBULIZER EVERY 6 HOURS AS NEEDED FOR WHEEZING FOR SHORTNESS OF BREATH Patient taking differently: Take 2.5 mg by nebulization every 6 (six) hours as needed for wheezing or shortness of breath. 12/17/21  Yes Hunsucker, Bonna Gains, MD  albuterol (VENTOLIN HFA) 108 (90 Base) MCG/ACT inhaler Inhale 2 puffs into the lungs every 6 (six) hours as needed for wheezing or shortness of breath. 05/17/21  Yes Hunsucker, Bonna Gains, MD  Cholecalciferol (VITAMIN D) 50 MCG (2000 UT) tablet Take 2,000 Units by mouth daily.   Yes [provider]  docusate sodium (COLACE) 100 MG capsule Take 100 mg by mouth 2 (two) times daily.   Yes [provider]  ferrous sulfate 325 (65 FE) MG EC tablet Take 325 mg by mouth daily.   Yes [provider]  furosemide (LASIX) 40 MG  tablet Take 1 tablet (40 mg total) by mouth daily. 05/11/22  Yes Evans Lance, MD  HYDROcodone-acetaminophen (NORCO) 10-325 MG tablet 3 (three) times daily as needed. 08/06/20  Yes [provider]  levothyroxine (SYNTHROID) 125 MCG tablet TAKE 1 TABLET BY MOUTH EVERY DAY Patient taking differently: Take 125 mcg by mouth daily before breakfast. 05/10/22  Yes Wardell Honour, MD  Multiple Vitamin (MULTIVITAMIN) tablet Take 1 tablet by mouth daily.   Yes [provider]  psyllium (REGULOID) 0.52 g capsule Take 0.52 g by mouth daily. Fiber   Yes [provider]  SENNA CO 1-2 tablets by Combination route every evening.   Yes [provider]  TRELEGY ELLIPTA 100-62.5-25 MCG/ACT AEPB Inhale 1 puff into the lungs daily. 01/11/22  Yes Hunsucker, Bonna Gains, MD  vitamin C (ASCORBIC ACID) 500 MG tablet Take 500 mg by mouth daily.   Yes [provider]  XARELTO 15 MG TABS tablet TAKE 1 TABLET BY MOUTH EVERY DAY WITH SUPPER Patient taking differently: Take 15 mg by mouth daily with lunch. 01/18/22  Yes Evans Lance, MD    Physical Exam: Vitals:   07/04/22 1248 07/04/22 1252 07/04/22 1400 07/04/22 1430  BP: 106/82  102/66 107/77  Pulse: 90  88 81  Resp: (!) 28  18 (!) 24  Temp: 98 F (36.7 C)     TempSrc: Axillary     SpO2: 98%  95% 95%  Weight:  76.2 kg    Height:  6' (1.829 m)     General: 88 y.o. male resting in bed in NAD Eyes: PERRL, normal sclera ENMT: Nares patent w/o discharge, orophaynx clear, dentition normal, ears w/o discharge/lesions/ulcers Neck: Supple, trachea midline Cardiovascular: RRR, +S1, S2, no g/r, 1/6 SEM, equal pulses throughout Respiratory: decreased at left base, increased WOB, no w/r/r GI: BS+, NDNT, no masses noted, no organomegaly noted MSK: No c/c; trace b/l pedal edema Neuro: A&O x 3, no focal deficits Psyc: Appropriate interaction and affect, calm/cooperative  Data Reviewed:  Results for orders placed or performed  during the hospital encounter of 07/04/22 (from the past 24 hour(s))  Comprehensive metabolic panel     Status: Abnormal   Collection Time: 07/04/22  2:06 PM  Result Value Ref Range   Sodium 137 135 - 145 mmol/L   Potassium 3.8 3.5 - 5.1 mmol/L   Chloride 102 98 - 111 mmol/L   CO2 24 22 - 32 mmol/L   Glucose, Bld 120 (H) 70 - 99 mg/dL   BUN 37 (H) 8 - 23 mg/dL   Creatinine, Ser 1.90 (H) 0.61 - 1.24 mg/dL   Calcium 9.0 8.9 -  10.3 mg/dL   Total Protein 8.0 6.5 - 8.1 g/dL   Albumin 3.4 (L) 3.5 - 5.0 g/dL   AST 27 15 - 41 U/L   ALT 17 0 - 44 U/L   Alkaline Phosphatase 63 38 - 126 U/L   Total Bilirubin 1.5 (H) 0.3 - 1.2 mg/dL   GFR, Estimated 33 (L) >60 mL/min   Anion gap 11 5 - 15  Brain natriuretic peptide     Status: Abnormal   Collection Time: 07/04/22  2:06 PM  Result Value Ref Range   B Natriuretic Peptide 429.4 (H) 0.0 - 100.0 pg/mL  CBC with Differential/Platelet     Status: Abnormal   Collection Time: 07/04/22  2:06 PM  Result Value Ref Range   WBC 9.0 4.0 - 10.5 K/uL   RBC 3.38 (L) 4.22 - 5.81 MIL/uL   Hemoglobin 11.3 (L) 13.0 - 17.0 g/dL   HCT 35.0 (L) 39.0 - 52.0 %   MCV 103.6 (H) 80.0 - 100.0 fL   MCH 33.4 26.0 - 34.0 pg   MCHC 32.3 30.0 - 36.0 g/dL   RDW 17.6 (H) 11.5 - 15.5 %   Platelets 178 150 - 400 K/uL   nRBC 0.0 0.0 - 0.2 %   Neutrophils Relative % 87 %   Neutro Abs 7.9 (H) 1.7 - 7.7 K/uL   Lymphocytes Relative 3 %   Lymphs Abs 0.3 (L) 0.7 - 4.0 K/uL   Monocytes Relative 9 %   Monocytes Absolute 0.8 0.1 - 1.0 K/uL   Eosinophils Relative 0 %   Eosinophils Absolute 0.0 0.0 - 0.5 K/uL   Basophils Relative 0 %   Basophils Absolute 0.0 0.0 - 0.1 K/uL   Immature Granulocytes 1 %   Abs Immature Granulocytes 0.05 0.00 - 0.07 K/uL  SARS Coronavirus 2 by RT PCR (hospital order, performed in [place] hospital lab) *cepheid single result test* Anterior Nasal Swab     Status: None   Collection Time: 07/04/22  2:50 PM   Specimen: Anterior Nasal Swab  Result  Value Ref Range   SARS Coronavirus 2 by RT PCR NEGATIVE NEGATIVE   CXR: New moderate-sized left-sided pleural effusion with associated left basilar opacity.  CT chest: 1. Moderate size left pleural effusion has increased in size from prior imaging. Near complete consolidation of the left lower lobe, favor pneumonia over compressive atelectasis. 2. Chronic area of consolidation in the right middle lobe with air bronchograms, unchanged in appearance from prior imaging. Scarring is favored. 3. Areas of ground-glass opacity and septal thickening in the right lower lobe are also chronic and similar in appearance to prior imaging. 4. Cardiomegaly. Prominent main pulmonary artery suggesting pulmonary arterial hypertension. 5. Patulous esophagus with retained debris  EKG: a fib, w/ RBBB  Assessment and Plan: LLL PNA Left pleural effusion Acute hypoxic respiratory failure     - admit to inpt, tele     - change abx to vanc, cefepime     - Pulm consulted for loculated effusion, appreciate assistance     - could be an element of aspiration; CT shows debris in esophagus and wife reports that he has intermittent choking; SLP eval     - guaifenesin, PRN nebs  Hypothyroidism      - continue home regimen  COPD     - continue home regimen  PAF     - continue home regimen  AKI on CKD 3b     - renal US     -  watch nephrotoxins  Macrocytic anemia     - check B12, THF     - no evidence of bleed     - follow  Acute on Chronic diastolic HF?     - his BNP is elevated; don't know his baseline     - check an echo     - continue his lasix     - no current chest pain     - EKG as above     - trend troponin     - I&O, daily weights  Advance Care Planning:   Code Status: FULL  Consults: Pulmonology  Family Communication: w/ wife at bedside  Severity of Illness: The appropriate patient status for this patient is INPATIENT. Inpatient status is judged to be reasonable and necessary  in order to provide the required intensity of service to ensure the patient's safety. The patient's presenting symptoms, physical exam findings, and initial radiographic and laboratory data in the context of their chronic comorbidities is felt to place them at high risk for further clinical deterioration. Furthermore, it is not anticipated that the patient will be medically stable for discharge from the hospital within 2 midnights of admission.   * I certify that at the point of admission it is my clinical judgment that the patient will require inpatient hospital care spanning beyond 2 midnights from the point of admission due to high intensity of service, high risk for further deterioration and high frequency of surveillance required.*  Author: Jonnie Finner, DO 07/04/2022 3:38 PM  For on call review [email or web address].

## 2022-07-04 NOTE — Progress Notes (Signed)
Synopsis: Referred in November 2018 for bronchiectasis by Robert Honour, MD.  Previously patient of Dr. Lake [name] and Dr. Carlis [name].  Subjective:   PATIENT ID: Robert [name]. GENDER: male DOB: [birth year], MRN: [id number]  Chief Complaint  Patient presents with   Follow-up    PT Follow-Up  Wife states PT has difficulty breathing, losing WT (no appetite), Left side pain    Robert [name] is 88 y.o. gentleman who presents for follow-up.  He is accompanied today by his wife.  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    Patient presents for acute visit.  Has schedule appointment afternoon but given worsening symptoms was able to reschedule for this morning.  Several days of poor appetite.  Not eating well.  20 pound weight loss.  Last few days has left-sided chest pain.  Worse with deep breaths.  Worsening shortness of breath.  More time in bed.  Oxygen saturation as low as the 70s at home.  He has no history of oxygen supplementation.  No oxygen at home.  At rest it staying on 88% with any exertion dropped to the 70s and 80s.  Today, he is 82% on room air.  His fingers and lips appear cyanotic.  Discussed at length the need for emergent evaluation and hospitalization likely.  Given hypoxemia.  He has increased work of breathing, tachypnea.  Discussed possible etiologies including pneumothorax, pneumonia, PE.  Notably he is on chronic anticoagulation making PE less likely.  They agree for EMS to be called transfer to the hospital.  He was placed on supplemental oxygen and titrated up to achieve oxygen saturation 88 or better.      Past Medical History:  Diagnosis Date   Abnormal CT of spine 10/24/2018   Per [facility] new patient packet   Atrial fibrillation ([place])    Back pain    Per [place] new patient packet   Bradycardia    Per [facility] new patient packet   Bronchiectasis [facility])    Per [place] new patient packet   COPD, mild ([place])    Per [place] new  patient packet   Hx of colonoscopy 11/14/2007   Per [place] new patient packet   Hx of CT scan of chest 08/23/2018   Per [place] new patient packet   Hypothyroidism    Lung cancer [facility])    Per [place] new patient packet   Neck pain    Per [place] new patient packet   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[facility])    s/p XRT and surgical removal 1990   Pneumonia 2015   Symptomatic bradycardia    s/p PPM 1995     Family History  Problem Relation Age of Onset   Arrhythmia Father        Atrial fibrillation   Diabetes Brother        Insulin dependent   Breast cancer Sister    Breast cancer Sister      Past Surgical History:  Procedure Laterality Date   CATARACT EXTRACTION, BILATERAL     COLONOSCOPY  11/14/2007   Per [place] new patient packet   LUNG REMOVAL, PARTIAL     PACEMAKER [place] N/A 07/23/2018   Procedure: PPM GENERATOR CHANGEOUT;  Surgeon: Robert Lance, MD;  Location: [place] CV LAB;  Service: Cardiovascular;  Laterality: N/A;    Social History   Socioeconomic History   Marital status: Married    Spouse name:  Not on file   Number of children: 2   Years of education: Not on file   Highest education level: Not on file  Occupational History   Occupation: Retired  Tobacco Use   Smoking status: Former    Packs/day: 1.00    Years: 40.00    Total pack years: 40.00    Types: Cigarettes    Quit date: 09/05/1988    Years since quitting: 33.8   Smokeless tobacco: Never  Vaping Use   Vaping Use: Never used  Substance and Sexual Activity   Alcohol use: Yes    Alcohol/week: 7.0 standard drinks of alcohol    Types: 7 Standard drinks or equivalent per week    Comment: 1 drink daily   Drug use: No   Sexual activity: Not on file  Other Topics Concern   Not on file  Social History Narrative   Diet      Do you drink/eat things with caffeine Yes      Marital Status Married What year were you married? 1957      Do you live in a house, apartment, assisted living,  condo, trailer, etc.? Apartment toenhome      Is it one or more stories? One      How many persons live in your home? 2         Do you have any pets in your home?(please list) No      Highest level of education completed: Bachelors degree      Current or past profession: Psychologist, sport and exercise      Do you exercise?: Some    Type and how often: Various      Do you have a Living Will? No      Do you have a DNR form?         If not, would you like to discuss one?       Do you have signed POA/HPOA forms? No      Do you have difficulty bathing or dressing yourself? No      Do you have difficulty preparing food or eating? No      Do you have difficulty managing medications? No      Do you have difficulty managing your finances? No      Do you have difficulty affording your medications? No                  Social Determinants of Health   Financial Resource Strain: Not on file  Food Insecurity: Not on file  Transportation Needs: Not on file  Physical Activity: Not on file  Stress: Not on file  Social Connections: Not on file  Intimate Partner Violence: Not on file     No Known Allergies   Immunization History  Administered Date(s) Administered   Fluad Quad(high Dose 65+) 06/21/2022   Influenza Split 06/06/2017   Influenza, High Dose Seasonal PF 07/06/2019, 06/17/2020, 06/21/2021   Influenza,inj,quad, With Preservative 06/05/2018   PFIZER(Purple Top)SARS-COV-2 Vaccination 09/29/2019, 10/21/2019, 08/13/2020   Pfizer Covid-19 Vaccine Bivalent Booster 30yrs & up 01/18/2021, 08/05/2021, 05/29/2022   Pneumococcal Conjugate-13 06/15/2016   Pneumococcal-Unspecified 03/11/2019    Outpatient Medications Prior to Visit  Medication Sig Dispense Refill   albuterol (PROVENTIL) (2.5 MG/3ML) 0.083% nebulizer solution USE 1 VIAL IN NEBULIZER EVERY 6 HOURS AS NEEDED FOR WHEEZING FOR SHORTNESS OF BREATH 900 mL 5   albuterol (VENTOLIN HFA) 108 (90 Base) MCG/ACT inhaler Inhale 2 puffs into  the lungs every 6 (six)  hours as needed for wheezing or shortness of breath. 8 g 6   Cholecalciferol (VITAMIN D) 50 MCG (2000 UT) tablet Take 2,000 Units by mouth daily.     docusate sodium (COLACE) 100 MG capsule Take 100 mg by mouth 2 (two) times daily.     ferrous sulfate 325 (65 FE) MG EC tablet Take 325 mg by mouth daily.     furosemide (LASIX) 40 MG tablet Take 1 tablet (40 mg total) by mouth daily. 90 tablet 2   HYDROcodone-acetaminophen (NORCO) 10-325 MG tablet 3 (three) times daily as needed.     levothyroxine (SYNTHROID) 125 MCG tablet TAKE 1 TABLET BY MOUTH EVERY DAY 90 tablet 3   Magnesium 500 MG CAPS Take by mouth.     Multiple Vitamin (MULTIVITAMIN) tablet Take 1 tablet by mouth daily.     psyllium (REGULOID) 0.52 g capsule Take 0.52 g by mouth daily.     TRELEGY ELLIPTA 100-62.5-25 MCG/ACT AEPB Inhale 1 puff into the lungs daily. 60 each 11   vitamin C (ASCORBIC ACID) 500 MG tablet Take 500 mg by mouth daily.     XARELTO 15 MG TABS tablet TAKE 1 TABLET BY MOUTH EVERY DAY WITH SUPPER 90 tablet 1   No facility-administered medications prior to visit.      Objective:   Vitals:   07/04/22 1116  BP: 106/72  Pulse: 91  SpO2: (!) 82%  Weight: 169 lb (76.7 kg)  Height: 6' (1.829 m)    (!) 82% on  RA BMI Readings from Last 3 Encounters:  07/04/22 22.92 kg/m  06/15/22 23.02 kg/m  04/13/22 24.01 kg/m   Wt Readings from Last 3 Encounters:  07/04/22 169 lb (76.7 kg)  06/15/22 169 lb 11.2 oz (77 kg)  04/13/22 177 lb (80.3 kg)    Physical examination: General: Acutely ill-appearing, hunched over in wheelchair Respiratory: Diminished throughout on the left but breath sounds are present, relatively clear on the right, no crackles or wheeze Skin: Cyanosis of the lips and fingers present   CBC    Component Value Date/Time   WBC 4.9 06/09/2022 0713   RBC 3.21 (L) 06/09/2022 0713   HGB 10.7 (L) 06/09/2022 0713   HGB 14.0 07/10/2018 1135   HCT 31.4 (L) 06/09/2022  0713   HCT 41.0 07/10/2018 1135   PLT 119 (L) 06/09/2022 0713   PLT 150 07/10/2018 1135   MCV 97.8 06/09/2022 0713   MCV 97 07/10/2018 1135   MCH 33.3 (H) 06/09/2022 0713   MCHC 34.1 06/09/2022 0713   RDW 15.4 (H) 06/09/2022 0713   RDW 12.5 07/10/2018 1135   LYMPHSABS 1,539 06/09/2022 0713   LYMPHSABS 1.3 11/23/2016 1326   MONOABS 1.2 (H) 07/03/2020 1225   EOSABS 270 06/09/2022 0713   EOSABS 0.2 11/23/2016 1326   BASOSABS 39 06/09/2022 0713   BASOSABS 0.0 11/23/2016 1326    Micro: 08/07/2017 AFB sputum-negative 08/07/2017 respiratory-normal flora 06/29/2020: OP flora  Chest Imaging- films reviewed and as per EMR Pulmonary Functions Testing Results: Spirometry 2018 reviewed and interpreted as suggestive of mild restriction vs gas trapping      Assessment & Plan:     ICD-10-CM   1. Acute hypoxemic respiratory failure (HCC)  J96.01     2. Bronchiectasis without complication ([place])  W54.6      Acute hypoxemic respiratory failure: 80% on room air.  Reported as low as in the 70s at home with exertion.  Associated cough and left-sided chest pain.  Mildly increased work of  breathing.  Eyeballs poorly.  Breath sounds diminished on the left with relatively clear sounds on the right.  Differential diagnosis includes pneumothorax, pneumonia, other parenchymal disease, PE.  Also, worsening anemia is possible given decrease in hemoglobin recently.  PE felt less likely given patient is on chronic anticoagulation.  Placed on oxygen supplementation.  Given acute presentation as well as tachypnea, increased work of breathing, advised transportation to the ED.  EMS was called to transport patient to the emergency room.  Lung Nodule: Right middle lobe infiltrate favored to be atelectasis with scarring in the setting of recurrent pneumonia/exacerbations.  Some concern for lung cancer given his history of the same.  PET scan 10/2021 for further characterization overall reassuring.  After shared  decision making, no further imaging needed.  Multilobar bronchiectasis with acute exacerbation-possibly related to recurrent aspiration given distribution lower lobes.  Responds to steroids, Augmentin.   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 - Continue Trelegy  History of lung cancer- RUL pancoast tumor.  Previous tobacco abuse before quitting in 1990. -No concerning symptoms   RTC in 2 months with Dr. Silas Flood.  Lanier Clam, MD

## 2022-07-04 NOTE — ED Triage Notes (Signed)
Pt BIB [facility] with c/o SOB on exertion. A&Ox4. Afib w/ demand pacing. Given 10mg  albuterol, 0.5mg  Atrovent, & 125 Solu-medrol.   BP 105/64 HR 88 RR 24 O2 96 on neb TX CBG 154

## 2022-07-04 NOTE — Plan of Care (Signed)

## 2022-07-04 NOTE — Progress Notes (Signed)
RT NOTE:  Peak flow 200

## 2022-07-04 NOTE — Consult Note (Addendum)
NAME:  Meliton, Samad MRN:  [id number], DOB:  06/12/1929, LOS: 0 ADMISSION DATE:  07/04/2022, CONSULTATION DATE:  10/30 REFERRING MD:  Marylyn Ishihara, CHIEF COMPLAINT:  Dyspnea   History of Present Illness:  88 year old male who I followed in the past for bronchiectasis and a distant history of lung cancer presents to the [facility] emergency department today in the setting of 2 weeks of shortness of breath, cough, and weight loss.  The patient's wife provides most of the history because he says he has been having memory problems recently.  She says that for the last 2 weeks she has noticed progressive shortness of breath and weight loss.  However, she states that this has been going on for the last year or so but it has been no significant in the last 2 weeks.  He has had a cough which at times has been productive.  She denies fevers or chills.  She says that she believes he is lost about 20 pounds over the last year or so.  He came to the pulmonary clinic today for routine visit and was noted to be profoundly hypoxemic with an O2 saturation of 82% on room air so he was advised to come to the emergency department. The patient takes Xarelto, last took it yesterday. Today the emergency department he had a CT scan of his chest performed which showed consolidation in the left lower lobe versus compressive atelectasis with a moderate-sized free-flowing appearing pleural effusion.  Changes from his prior right upper lobectomy and 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resection were noted.  Bronchiectasis changes were noted.  Of note, over the last several years he has had recurrent episodes of bronchiectasis exacerbations.  She says that these have typically been best treated with Augmentin.  He has had this as recently as a few weeks ago.  She says that he also chokes on food with some frequency.  Pertinent  Medical History  Right upper lobe 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removed in 1990, treated with radiation therapy Bronchiectasis Chronic  aspiration Chronic back pain Hypothyroidism Atrial fibrillation, on Xarelto  Significant Hospital Events: Including procedures, antibiotic start and stop dates in addition to other pertinent events   October 30 CT chest images independently reviewed showing bronchiectasis and surgical changes in the right lung, moderate size pleural effusion with compressive atelectasis versus consolidation in the left lower lobe  Interim History / Subjective:  As above  Objective   Blood pressure 94/63, pulse 86, temperature 98.1 F (36.7 C), temperature source Oral, resp. rate 18, height 6' (1.829 m), weight 76.2 kg, SpO2 90 %.       No intake or output data in the 24 hours ending 07/04/22 1755 Filed Weights   07/04/22 1252  Weight: 76.2 kg    Examination:  General:  Resting comfortably in bed HENT: NCAT OP clear PULM: Rhonchi right lower lobe, diminished on left, normal effort CV: Irreg irreg, no mgr GI: BS+, soft, nontender MSK: normal bulk and tone Neuro: awake, alert, no distress, MAEW   Resolved Hospital Problem list    Assessment & Plan:  Bronchiectasis with acute exacerbation Community-acquired pneumonia, at risk for resistant organisms Acute respiratory failure with hypoxemia Compressive atelectasis of left lower lobe Moderate size pleural effusion on left Atrial fibrillation on Xarelto Dysphagia, recurrent aspiration  Discussion: I am concerned that the left lower lobe pleural fluid collection could be a complicated parapneumonic effusion.  Given his increased risk for bleeding we will hold his Xarelto today and plan for an elective  left-sided thoracentesis in the morning.  We will plan to send that for culture, cell count, and cytology.  I agree with the decision to treat him with broad-spectrum antibiotics given his extensive exposure to oral antibiotics in the last 2 years.  Plan: Vancomycin, cefepime Hypertonic saline bid Chest PT qid Resp  culture guaifenesin Hold Xarelto Plan elective thoracentesis on October 31 Wean off O2 for O2 saturation greater than 88% We will consult speech therapy to assess for recurrent aspiration  We will follow  Best Practice (right click and "Reselect all SmartList Selections" daily)   Per HPI  Discussed CODE STATUS with the patient and his wife.  He desires full code including CPR and mechanical ventilation.  I counseled that given his advanced age and his chronic lung disease that it would be very difficult for him to come off of the ventilator if he were to require life support.  They felt that a time-limited trial would be reasonable of 3 to 4 days of life support if necessary.  Labs   CBC: Recent Labs  Lab 07/04/22 1406  WBC 9.0  NEUTROABS 7.9*  HGB 11.3*  HCT 35.0*  MCV 103.6*  PLT 338    Basic Metabolic Panel: Recent Labs  Lab 07/04/22 1406  NA 137  K 3.8  CL 102  CO2 24  GLUCOSE 120*  BUN 37*  CREATININE 1.90*  CALCIUM 9.0   GFR: Estimated Creatinine Clearance: 27.3 mL/min (A) (by C-G formula based on SCr of 1.9 mg/dL (H)). Recent Labs  Lab 07/04/22 1406  WBC 9.0    Liver Function Tests: Recent Labs  Lab 07/04/22 1406  AST 27  ALT 17  ALKPHOS 63  BILITOT 1.5*  PROT 8.0  ALBUMIN 3.4*   No results for input(s): "LIPASE", "AMYLASE" in the last 168 hours. No results for input(s): "AMMONIA" in the last 168 hours.  ABG No results found for: "PHART", "PCO2ART", "PO2ART", "HCO3", "TCO2", "ACIDBASEDEF", "O2SAT"   Coagulation Profile: No results for input(s): "INR", "PROTIME" in the last 168 hours.  Cardiac Enzymes: No results for input(s): "CKTOTAL", "CKMB", "CKMBINDEX", "TROPONINI" in the last 168 hours.  HbA1C: No results found for: "HGBA1C"  CBG: No results for input(s): "GLUCAP" in the last 168 hours.  Review of Systems:   Gen: per HPI HEENT: Denies blurred vision, double vision, hearing loss, tinnitus, sinus congestion, rhinorrhea, sore  throat, neck stiffness, dysphagia PULM: per HPI CV: Denies chest pain, edema, orthopnea, paroxysmal nocturnal dyspnea, palpitations GI: Denies abdominal pain, nausea, vomiting, diarrhea, hematochezia, melena, constipation, change in bowel habits GU: Denies dysuria, hematuria, polyuria, oliguria, urethral discharge Endocrine: Denies hot or cold intolerance, polyuria, polyphagia or appetite change Derm: Denies rash, dry skin, scaling or peeling skin change Heme: Denies easy bruising, bleeding, bleeding gums Neuro: Denies headache, numbness, weakness, slurred speech, loss of memory or consciousness   Past Medical History:  He,  has a past medical history of Abnormal CT of spine (10/24/2018), Atrial fibrillation ([place]), Back pain, Bradycardia, Bronchiectasis ([place]), COPD, mild ([place]), colonoscopy (11/14/2007), CT scan of chest (08/23/2018), Hypothyroidism, Lung cancer ([place]), Neck pain, 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([place]), Pneumonia (2015), and Symptomatic bradycardia.   Surgical History:   Past Surgical History:  Procedure Laterality Date   CATARACT EXTRACTION, BILATERAL     COLONOSCOPY  11/14/2007   Per [place] new patient packet   LUNG REMOVAL, PARTIAL     PACEMAKER INSERTION  1995   [place] N/A 07/23/2018   Procedure: PPM GENERATOR CHANGEOUT;  Surgeon: [name]  W, MD;  Location: [place] [facility] LAB;  Service: Cardiovascular;  Laterality: N/A;     Social History:   reports that he quit smoking about 33 years ago. His smoking use included cigarettes. He has a 40.00 pack-year smoking history. He has never used smokeless tobacco. He reports that he does not currently use alcohol after a past usage of about 7.0 standard drinks of alcohol per week. He reports that he does not use drugs.   Family History:  His family history includes Arrhythmia in his father; Breast cancer in his sister and sister; Diabetes in his brother.   Allergies No Known Allergies   Home Medications  Prior to  Admission medications   Medication Sig Start Date End Date Taking? Authorizing Provider  albuterol (PROVENTIL) (2.5 MG/3ML) 0.083% nebulizer solution USE 1 VIAL IN NEBULIZER EVERY 6 HOURS AS NEEDED FOR WHEEZING FOR SHORTNESS OF BREATH Patient taking differently: Take 2.5 mg by nebulization every 6 (six) hours as needed for wheezing or shortness of breath. 12/17/21  Yes Hunsucker, Bonna Gains, MD  albuterol (VENTOLIN HFA) 108 (90 Base) MCG/ACT inhaler Inhale 2 puffs into the lungs every 6 (six) hours as needed for wheezing or shortness of breath. 05/17/21  Yes Hunsucker, Bonna Gains, MD  Cholecalciferol (VITAMIN D) 50 MCG (2000 UT) tablet Take 2,000 Units by mouth daily.   Yes [provider]  docusate sodium (COLACE) 100 MG capsule Take 100 mg by mouth 2 (two) times daily.   Yes [provider]  ferrous sulfate 325 (65 FE) MG EC tablet Take 325 mg by mouth daily.   Yes [provider]  furosemide (LASIX) 40 MG tablet Take 1 tablet (40 mg total) by mouth daily. 05/11/22  Yes Evans Lance, MD  HYDROcodone-acetaminophen (NORCO) 10-325 MG tablet 3 (three) times daily as needed. 08/06/20  Yes [provider]  levothyroxine (SYNTHROID) 125 MCG tablet TAKE 1 TABLET BY MOUTH EVERY DAY Patient taking differently: Take 125 mcg by mouth daily before breakfast. 05/10/22  Yes Wardell Honour, MD  Multiple Vitamin (MULTIVITAMIN) tablet Take 1 tablet by mouth daily.   Yes [provider]  psyllium (REGULOID) 0.52 g capsule Take 0.52 g by mouth daily. Fiber   Yes [provider]  SENNA CO 1-2 tablets by Combination route every evening.   Yes [provider]  TRELEGY ELLIPTA 100-62.5-25 MCG/ACT AEPB Inhale 1 puff into the lungs daily. 01/11/22  Yes Hunsucker, Bonna Gains, MD  vitamin C (ASCORBIC ACID) 500 MG tablet Take 500 mg by mouth daily.   Yes [provider]  XARELTO 15 MG TABS tablet TAKE 1 TABLET BY MOUTH EVERY DAY WITH SUPPER Patient taking  differently: Take 15 mg by mouth daily with lunch. 01/18/22  Yes Evans Lance, MD     Critical care time: n/a    Roselie Awkward, MD [facility] Pager: [phone] Cell: [phone] After 7:00 pm call [place]  [phone]

## 2022-07-04 NOTE — ED Provider Notes (Addendum)
[facility] DEPT Provider Note   CSN: [id number] Arrival date & time: 07/04/22  1232     History  Chief Complaint  Patient presents with   Respiratory Distress    Robert [name]. is a 88 y.o. male.  HPI Patient presents via EMS due to concern of respiratory distress.  He has a recently diagnosed history of COPD, does not wear oxygen at home.  Today he was dyspneic, went to his pulmonology office.  There he was found to be hypoxic, with a saturation of 80% on room air.  EMS was notified.  Patient received Solu-Medrol, 2 sequential bronchodilator sessions, 10 mg albuterol, 0.5 ipratropium, has improved.  He denies pain, fever, weakness.    Home Medications Prior to Admission medications   Medication Sig Start Date End Date Taking? Authorizing Provider  albuterol (PROVENTIL) (2.5 MG/3ML) 0.083% nebulizer solution USE 1 VIAL IN NEBULIZER EVERY 6 HOURS AS NEEDED FOR WHEEZING FOR SHORTNESS OF BREATH Patient taking differently: Take 2.5 mg by nebulization every 6 (six) hours as needed for wheezing or shortness of breath. 12/17/21  Yes Hunsucker, Bonna Gains, MD  albuterol (VENTOLIN HFA) 108 (90 Base) MCG/ACT inhaler Inhale 2 puffs into the lungs every 6 (six) hours as needed for wheezing or shortness of breath. 05/17/21  Yes Hunsucker, Bonna Gains, MD  Cholecalciferol (VITAMIN D) 50 MCG (2000 UT) tablet Take 2,000 Units by mouth daily.   Yes [provider]  docusate sodium (COLACE) 100 MG capsule Take 100 mg by mouth 2 (two) times daily.   Yes [provider]  ferrous sulfate 325 (65 FE) MG EC tablet Take 325 mg by mouth daily.   Yes [provider]  furosemide (LASIX) 40 MG tablet Take 1 tablet (40 mg total) by mouth daily. 05/11/22  Yes Evans Lance, MD  HYDROcodone-acetaminophen (NORCO) 10-325 MG tablet 3 (three) times daily as needed. 08/06/20  Yes [provider]  levothyroxine (SYNTHROID) 125 MCG tablet TAKE 1 TABLET BY  MOUTH EVERY DAY Patient taking differently: Take 125 mcg by mouth daily before breakfast. 05/10/22  Yes Wardell Honour, MD  Multiple Vitamin (MULTIVITAMIN) tablet Take 1 tablet by mouth daily.   Yes [provider]  psyllium (REGULOID) 0.52 g capsule Take 0.52 g by mouth daily. Fiber   Yes [provider]  SENNA CO 1-2 tablets by Combination route every evening.   Yes [provider]  TRELEGY ELLIPTA 100-62.5-25 MCG/ACT AEPB Inhale 1 puff into the lungs daily. 01/11/22  Yes Hunsucker, Bonna Gains, MD  vitamin C (ASCORBIC ACID) 500 MG tablet Take 500 mg by mouth daily.   Yes [provider]  XARELTO 15 MG TABS tablet TAKE 1 TABLET BY MOUTH EVERY DAY WITH SUPPER Patient taking differently: Take 15 mg by mouth daily with lunch. 01/18/22  Yes Evans Lance, MD      Allergies    Patient has no known allergies.    Review of Systems   Review of Systems  All other systems reviewed and are negative.   Physical Exam Updated Vital Signs BP 108/71   Pulse 77   Temp 98 F (36.7 C) (Axillary)   Resp 18   Ht 6' (1.829 m)   Wt 76.2 kg   SpO2 96%   BMI 22.78 kg/m  Physical Exam Vitals and nursing note reviewed.  Constitutional:      Appearance: He is well-developed. He is ill-appearing.  HENT:     Head: Normocephalic and atraumatic.  Eyes:     Conjunctiva/sclera: Conjunctivae normal.  Cardiovascular:     Rate and Rhythm: Regular rhythm. Tachycardia present.  Pulmonary:     Effort: Tachypnea, accessory muscle usage and respiratory distress present.     Breath sounds: Decreased air movement present. No stridor. No wheezing.  Abdominal:     General: There is no distension.  Skin:    General: Skin is warm and dry.  Neurological:     Mental Status: He is alert and oriented to person, place, and time.     ED Results / Procedures / Treatments   Labs (all labs ordered are listed, but only abnormal results are displayed) Labs Reviewed  COMPREHENSIVE  METABOLIC PANEL - Abnormal; Notable for the following components:      Result Value   Glucose, Bld 120 (*)    BUN 37 (*)    Creatinine, Ser 1.90 (*)    Albumin 3.4 (*)    Total Bilirubin 1.5 (*)    GFR, Estimated 33 (*)    All other components within normal limits  BRAIN NATRIURETIC PEPTIDE - Abnormal; Notable for the following components:   B Natriuretic Peptide 429.4 (*)    All other components within normal limits  CBC WITH DIFFERENTIAL/PLATELET - Abnormal; Notable for the following components:   RBC 3.38 (*)    Hemoglobin 11.3 (*)    HCT 35.0 (*)    MCV 103.6 (*)    RDW 17.6 (*)    Neutro Abs 7.9 (*)    Lymphs Abs 0.3 (*)    All other components within normal limits  SARS CORONAVIRUS 2 BY RT PCR    EKG EKG Interpretation  Date/Time:  Monday July 04 2022 12:56:16 EDT Ventricular Rate:  89 PR Interval:    QRS Duration: 121 QT Interval:  395 QTC Calculation: 481 R Axis:   62 Text Interpretation: Atrial fibrillation Right bundle branch block T wave abnormality Abnormal ECG Confirmed by Carmin Muskrat [phone]) on 07/04/2022 12:59:01 PM  Radiology CT Chest Wo Contrast  Result Date: 07/04/2022 CLINICAL DATA:  Pneumonia, complication suspected, xray done EXAM: CT CHEST WITHOUT CONTRAST TECHNIQUE: Multidetector CT imaging of the chest was performed following the standard protocol without IV contrast. RADIATION DOSE REDUCTION: This exam was performed according to the departmental dose-optimization program which includes automated exposure control, adjustment of the mA and/or kV according to patient size and/or use of iterative reconstruction technique. COMPARISON:  Radiograph earlier today. PET CT 10/25/2021, chest CT 09/10/2021 FINDINGS: Cardiovascular: Left-sided pacemaker in place with lead tips projecting over the right atrium and ventricle. Multi chamber cardiomegaly. Prominent main pulmonary artery at 3.3 cm. Small amount of fluid in the superior pericardial recess without  significant pericardial effusion. Aortic atherosclerosis and tortuosity. No periaortic stranding. Mediastinum/Nodes: 14 mm subcarinal node series 2, image 66, previously 20 mm. 15 mm low right infrahilar node, series 2, image 84, unchanged. Additional scattered small mediastinal lymph nodes that are not enlarged by size criteria in similar to prior exam. Assessment for hilar adenopathy is significantly limited in the absence of IV contrast. Patulous esophagus with retained debris. Lungs/Pleura: Moderate size left pleural effusion has increased in size from prior imaging. Small amount of fluid tracks in the inter lobar fissure. Near complete consolidation of the left lower lobe. Postsurgical volume loss in the right hemithorax. Chronic area of consolidation in the right middle lobe with air bronchograms, unchanged in appearance from prior imaging. Areas of ground-glass opacity in septal thickening in the right lower lobe, also  chronic and similar in appearance to prior exam. Upper Abdomen: No acute upper abdominal findings. Musculoskeletal: Chronic C7 compression fracture. Chronic T7 compression fracture. Postsurgical change of the right upper hemithorax. Thoracic spondylosis with spurring. Pectus excavatum deformity. No focal bone lesions. The bones are diffusely under mineralized. IMPRESSION: 1. Moderate size left pleural effusion has increased in size from prior imaging. Near complete consolidation of the left lower lobe, favor pneumonia over compressive atelectasis. 2. Chronic area of consolidation in the right middle lobe with air bronchograms, unchanged in appearance from prior imaging. Scarring is favored. 3. Areas of ground-glass opacity and septal thickening in the right lower lobe are also chronic and similar in appearance to prior imaging. 4. Cardiomegaly. Prominent main pulmonary artery suggesting pulmonary arterial hypertension. 5. Patulous esophagus with retained debris. Aortic Atherosclerosis  (ICD10-I70.0). Electronically Signed   By: Keith Rake M.D.   On: 07/04/2022 15:36   DG Chest Port 1 View  Result Date: 07/04/2022 CLINICAL DATA:  Shortness of breath EXAM: PORTABLE CHEST 1 VIEW COMPARISON:  10/25/2021, 08/12/2021 FINDINGS: Left-sided implanted cardiac device remains in place. Cardiomegaly. Aortic atherosclerosis. Moderate-sized left-sided pleural effusion with associated left basilar opacity. No pneumothorax. Postsurgical changes of the upper right hemithorax. IMPRESSION: New moderate-sized left-sided pleural effusion with associated left basilar opacity. Electronically Signed   By: Davina Poke D.O.   On: 07/04/2022 13:11    Procedures Procedures    Medications Ordered in ED Medications  azithromycin (ZITHROMAX) 500 mg in sodium chloride 0.9 % 250 mL IVPB (500 mg Intravenous New Bag/Given 07/04/22 1528)  cefTRIAXone (ROCEPHIN) 1 g in sodium chloride 0.9 % 100 mL IVPB (0 g Intravenous Stopped 07/04/22 1514)    ED Course/ Medical Decision Making/ A&P This patient with a Hx of CKD, COPD, CHF, afib and cancer presents to the ED for concern of respiratory distress, this involves an extensive number of treatment options, and is a complaint that carries with it a high risk of complications and morbidity.    The differential diagnosis includes COPD vs CHF vs pna   Social Determinants of Health:  age  Additional history obtained:  Additional history and/or information obtained from EMS , notable for details in HPI   After the initial evaluation, orders, including: ongoing albuterol, [place] O2, labs, xr were initiated.   Patient placed on Cardiac and Pulse-Oximetry Monitors. The patient was maintained on a cardiac monitor.  The cardiac monitored showed an rhythm of 90 sr, nml The patient was also maintained on pulse oximetry. The readings were typically 98% 2L , abnormal   On repeat evaluation of the patient improved markedly.  He is accompanied by his wife on  repeat exam, we discussed all findings and I demonstrated the CT imaging to her at bedside.  She is a former [employer] scientist.  Lab Tests:  I personally interpreted labs.  The pertinent results include: Worsening renal function, COVID-negative, elevated BNP  Imaging Studies ordered:  I independently visualized and interpreted imaging which showed x-ray and CT both reviewed, and demonstrated bedside to the patient's wife.  Concern for left lower lobe pneumonia with consolidation, pleural effusion I agree with the radiologist interpretation Dispostion / Final MDM:  After consideration of the diagnostic results and the patient's response to treatment, adult male with multiple medical issues presents with new dyspnea.  Patient does have a history of prior pneumonia, prior right-sided lobectomy due to cancer as well as heart failure.  Findings most consistent with pneumonia, though with evidence for worsening heart failure as  well as renal function.  No early evidence for bacteremia or sepsis.  Patient required substantial supplemental oxygen and multiple breathing treatments, steroids, improved markedly, was admitted.  CRITICAL CARE Performed by: Carmin Muskrat Total critical care time: 40 minutes Critical care time was exclusive of separately billable procedures and treating other patients. Critical care was necessary to treat or prevent imminent or life-threatening deterioration. Critical care was time spent personally by me on the following activities: development of treatment plan with patient and/or surrogate as well as nursing, discussions with consultants, evaluation of patient's response to treatment, examination of patient, obtaining history from patient or surrogate, ordering and performing treatments and interventions, ordering and review of laboratory studies, ordering and review of radiographic studies, pulse oximetry and re-evaluation of patient's condition.   Final Clinical Impression(s) / ED  Diagnoses Final diagnoses:  Acute respiratory failure with hypoxia ([place])  Pleural effusion     Carmin Muskrat, MD 07/04/22 1601    Carmin Muskrat, MD 07/04/22 [phone]

## 2022-07-04 NOTE — Progress Notes (Signed)
Pharmacy Antibiotic Note  Clemente Dewey. is a 88 y.o. male admitted on 07/04/2022 with pneumonia w/ L pleural effusion.  Pharmacy has been consulted for vancomycin and cefepime dosing.  Plan: Cefepime 2g IV q24h Vancomycin 1500mg  IV q48h for estimated AUC 505 using SCr 1.9, TBW, Vd 0.72 Check vancomycin levels at steady state, goal AUC 400-550 Follow up renal function & cultures  Height: 6' (182.9 cm) Weight: 76.2 kg (168 lb) IBW/kg (Calculated) : 77.6  Temp (24hrs), Avg:98 F (36.7 C), Min:98 F (36.7 C), Max:98 F (36.7 C)  Recent Labs  Lab 07/04/22 1406  WBC 9.0  CREATININE 1.90*    Estimated Creatinine Clearance: 27.3 mL/min (A) (by C-G formula based on SCr of 1.9 mg/dL (H)).    No Known Allergies  Antimicrobials this admission: 10/30 CTX/Azithro x 1 10/30 Vancomycin >> 10/30 Cefepime >>  Dose adjustments this admission:  Microbiology results:   Thank you for allowing pharmacy to be a part of this patient's care.  Peggyann Juba, PharmD, BCPS Pharmacy: [phone] 07/04/2022 4:40 PM

## 2022-07-04 NOTE — Progress Notes (Signed)
Connected patient to continuous bedside pulse ox monitor.

## 2022-07-05 ENCOUNTER — Inpatient Hospital Stay (HOSPITAL_COMMUNITY): Payer: Medicare Other

## 2022-07-05 DIAGNOSIS — J9601 Acute respiratory failure with hypoxia: Secondary | ICD-10-CM | POA: Diagnosis not present

## 2022-07-05 DIAGNOSIS — I5033 Acute on chronic diastolic (congestive) heart failure: Secondary | ICD-10-CM

## 2022-07-05 LAB — ECHOCARDIOGRAM COMPLETE
AR max vel: 2.16 cm2
AV Area VTI: 2.96 cm2
AV Area mean vel: 2.41 cm2
AV Mean grad: 4 mmHg
AV Peak grad: 6.9 mmHg
Ao pk vel: 1.31 m/s
Area-P 1/2: 4.6 cm2
Calc EF: 53.3 %
Height: 72 in
MV M vel: 2.65 m/s
MV Peak grad: 28.1 mmHg
S' Lateral: 2.8 cm
Single Plane A2C EF: 59.1 %
Single Plane A4C EF: 52.8 %
Weight: 2613.77 oz

## 2022-07-05 LAB — COMPREHENSIVE METABOLIC PANEL
ALT: 19 U/L (ref 0–44)
AST: 34 U/L (ref 15–41)
Albumin: 3.2 g/dL — ABNORMAL LOW (ref 3.5–5.0)
Alkaline Phosphatase: 60 U/L (ref 38–126)
Anion gap: 10 (ref 5–15)
BUN: 44 mg/dL — ABNORMAL HIGH (ref 8–23)
CO2: 24 mmol/L (ref 22–32)
Calcium: 9 mg/dL (ref 8.9–10.3)
Chloride: 106 mmol/L (ref 98–111)
Creatinine, Ser: 1.92 mg/dL — ABNORMAL HIGH (ref 0.61–1.24)
GFR, Estimated: 32 mL/min — ABNORMAL LOW (ref >60)
Glucose, Bld: 139 mg/dL — ABNORMAL HIGH (ref 70–99)
Potassium: 3.9 mmol/L (ref 3.5–5.1)
Sodium: 140 mmol/L (ref 135–145)
Total Bilirubin: 1 mg/dL (ref 0.3–1.2)
Total Protein: 7.7 g/dL (ref 6.5–8.1)

## 2022-07-05 LAB — CBC
HCT: 35.2 % — ABNORMAL LOW (ref 39.0–52.0)
Hemoglobin: 11.2 g/dL — ABNORMAL LOW (ref 13.0–17.0)
MCH: 33.2 pg (ref 26.0–34.0)
MCHC: 31.8 g/dL (ref 30.0–36.0)
MCV: 104.5 fL — ABNORMAL HIGH (ref 80.0–100.0)
Platelets: 181 10*3/uL (ref 150–400)
RBC: 3.37 MIL/uL — ABNORMAL LOW (ref 4.22–5.81)
RDW: 17.7 % — ABNORMAL HIGH (ref 11.5–15.5)
WBC: 14.2 10*3/uL — ABNORMAL HIGH (ref 4.0–10.5)
nRBC: 0 % (ref 0.0–0.2)

## 2022-07-05 LAB — STREP PNEUMONIAE URINARY ANTIGEN: Strep Pneumo Urinary Antigen: NEGATIVE

## 2022-07-05 MED ORDER — RIVAROXABAN 15 MG PO TABS
15.0000 mg | ORAL_TABLET | Freq: Every day | ORAL | Status: DC
  Administered 2022-07-05 – 2022-07-07 (×3): 15 mg via ORAL
  Filled 2022-07-05 (×3): qty 1

## 2022-07-05 MED ORDER — ORAL CARE MOUTH RINSE
15.0000 mL | OROMUCOSAL | Status: DC
  Administered 2022-07-05 – 2022-07-08 (×9): 15 mL via OROMUCOSAL

## 2022-07-05 MED ORDER — ORAL CARE MOUTH RINSE: 15.0000 mL | OROMUCOSAL | Status: DC | PRN

## 2022-07-05 NOTE — Plan of Care (Signed)

## 2022-07-05 NOTE — Progress Notes (Signed)
  Echocardiogram 2D Echocardiogram has been performed.  Robert [name] 07/05/2022, 10:25 AM

## 2022-07-05 NOTE — Evaluation (Signed)
Clinical/Bedside Swallow Evaluation Patient Details  Name: Robert [name]. MRN: [id number] Date of Birth: [birth year]  Today's Date: 07/05/2022 Time: SLP Start Time (ACUTE ONLY): 1128 SLP Stop Time (ACUTE ONLY): 1155 SLP Time Calculation (min) (ACUTE ONLY): 27 min  Past Medical History:  Past Medical History:  Diagnosis Date   Abnormal CT of spine 10/24/2018   Per [facility] new patient packet   Atrial fibrillation ([place])    Back pain    Per [facility] new patient packet   Bradycardia    Per [facility] new patient packet   Bronchiectasis ([place])    Per [place] new patient packet   COPD, mild ([place])    Per [facility] new patient packet   Hx of colonoscopy 11/14/2007   Per [facility] new patient packet   Hx of CT scan of chest 08/23/2018   Per [facility] new patient packet   Hypothyroidism    Lung cancer [facility])    Per [place] new patient packet   Neck pain    Per [place] new patient packet   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[facility])    s/p XRT and surgical removal 1990   Pneumonia 2015   Symptomatic bradycardia    s/p PPM 1995   Past Surgical History:  Past Surgical History:  Procedure Laterality Date   CATARACT EXTRACTION, BILATERAL     COLONOSCOPY  11/14/2007   Per [place] new patient packet   LUNG REMOVAL, PARTIAL     PACEMAKER [place] N/A 07/23/2018   Procedure: PPM GENERATOR CHANGEOUT;  Surgeon: Evans Lance, MD;  Location: [facility] CV LAB;  Service: Cardiovascular;  Laterality: N/A;   HPI:  88 yo adm to [facility] with remote h/o lung cancer - adm with shortness of breath, cough, and weight loss.  Per MD note, The patient's wife provides most of the history because he says he has been having memory problems recently. Aspiration pna, Abnormal CT of spine (10/24/2018), Atrial fibrillation ([place]), Back pain, Bradycardia, Bronchiectasis ([place]), COPD, mild ([place]), colonoscopy (11/14/2007), CT scan of chest (08/23/2018), Hypothyroidism, Lung cancer ([place]), Neck pain, 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([place]), Pneumonia (2015), and  Symptomatic bradycardia. Moderate size left pleural effusion has increased in size from prior imaging. Near complete consolidation of the left lower lobe, favor pneumonia over compressive atelectasis. 2. Chronic area of consolidation in the right middle lobe with air bronchograms.  Pt is s/p thoracentesis today.  Swallow eval ordered due to pt overtly coughing with intake.    Assessment / Plan / Recommendation  Clinical Impression  Pt presents with symptoms consistent with primary esophageal dysphagia including globus, sensing slow clearance into stomach and coughing associated with po.  He reports issues with dysphagia for at least six months - with slow onset.  States he will cough and expectorate food particles that are accompanied by frothy secretions approx 3 times a week.  Pt also endorses liquids causing him to gag.  Contributes to weight loss per pt.  SLP can not rule out some component of pharyngeal dysphagia given advanced age and signs - however given his chest CT showed patulous esophagus with debris and his symptoms, suspect primary esophageal deficits.  Given pt's weight loss associated with dysphagia and pulmonary compromise, recommend esophagram.  Pt, wife and RN agreeable to plan and secure chatted MD. SLP Visit Diagnosis: Dysphagia, unspecified (R13.10)    Aspiration Risk  Moderate aspiration risk    Diet Recommendation NPO  Liquid Administration via: Cup;Straw Medication Administration: Other (Comment) (as tolerated) Supervision:  Patient able to self feed Compensations: Slow rate;Small sips/bites Postural Changes: Seated upright at 90 degrees;Remain upright for at least 30 minutes after po intake    Other  Recommendations Oral Care Recommendations: Oral care BID    Recommendations for follow up therapy are one component of a multi-disciplinary discharge planning process, led by the attending physician.  Recommendations may be updated based on patient status, additional  functional criteria and insurance authorization.  Follow up Recommendations Follow physician's recommendations for discharge plan and follow up therapies      Assistance Recommended at Discharge Set up Supervision/Assistance  Functional Status Assessment Patient has had a recent decline in their functional status and demonstrates the ability to make significant improvements in function in a reasonable and predictable amount of time.  Frequency and Duration min 1 x/week  1 week       Prognosis     Good for goals   Swallow Study   General Date of Onset: 12/20/21 (approx six months ago) HPI: 88 yo adm to [facility] with remote h/o lung cancer - adm with shortness of breath, cough, and weight loss.  Per MD note, The patient's wife provides most of the history because he says he has been having memory problems recently. Aspiration pna, Abnormal CT of spine (10/24/2018), Atrial fibrillation ([place]), Back pain, Bradycardia, Bronchiectasis ([place]), COPD, mild ([place]), colonoscopy (11/14/2007), CT scan of chest (08/23/2018), Hypothyroidism, Lung cancer ([place]), Neck pain, 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([place]), Pneumonia (2015), and Symptomatic bradycardia. Moderate size left pleural effusion has increased in size from prior imaging. Near complete consolidation of the left lower lobe, favor pneumonia over compressive atelectasis. 2. Chronic area of consolidation in the right middle lobe with air bronchograms.  Pt is s/p thoracentesis today.  Swallow eval ordered due to pt overtly coughing with intake. Type of Study: Bedside Swallow Evaluation Previous Swallow Assessment: none in epic Diet Prior to this Study: Regular;Thin liquids Temperature Spikes Noted: No Respiratory Status: Nasal cannula;Other (comment) ([facility]NC) History of Recent Intubation: No Behavior/Cognition: Alert;Cooperative;Pleasant mood Oral Cavity Assessment: Within Functional Limits;Dry Oral Care Completed by SLP: Yes (pt conducted) Oral Cavity - Dentition: Adequate  natural dentition Vision: Functional for self-feeding Self-Feeding Abilities: Able to feed self Patient Positioning: Upright in bed Baseline Vocal Quality: Low vocal intensity;Hoarse Volitional Cough: Other (Comment) (did not assess as pt just had thoracentesis) Volitional Swallow: Able to elicit    Oral/Motor/Sensory Function Overall Oral Motor/Sensory Function: Within functional limits (? minimal left facial asymmetry - lower)   Ice Chips Ice chips: Not tested   Thin Liquid Thin Liquid: Impaired Presentation: Self Fed;Straw Pharyngeal  Phase Impairments: Cough - Delayed;Throat Clearing - Delayed;Multiple swallows Other Comments: 3 swallows with single bolus    Nectar Thick Nectar Thick Liquid: Impaired Presentation: Self Fed;Straw Pharyngeal Phase Impairments: Multiple swallows   Honey Thick Honey Thick Liquid: Not tested   Puree Puree: Not tested   Solid     Solid: Not tested      Macario Golds 07/05/2022,12:58 PM   Kathleen Lime, MS [place] Office [phone] Pager [phone]

## 2022-07-05 NOTE — Procedures (Signed)
Thoracentesis  Procedure Note  Robert [name]  [id number]  1929-06-20  Date:07/05/22  Time:10:52 AM   Provider Performing:Brent Shay Bartoli   Procedure: Thoracentesis with imaging guidance ([id number])  Indication(s) Pleural Effusion  Consent Risks of the procedure as well as the alternatives and risks of each were explained to the patient and/or caregiver.  Consent for the procedure was obtained and is signed in the bedside chart  Anesthesia Topical only with 1% lidocaine    Time Out Verified patient identification, verified procedure, site/side was marked, verified correct patient position, special equipment/implants available, medications/allergies/relevant history reviewed, required imaging and test results available.   Sterile Technique Maximal sterile technique including full sterile barrier drape, hand hygiene, sterile gown, sterile gloves, mask, hair covering, sterile ultrasound probe cover (if used).  Procedure Description Ultrasound was used to identify appropriate pleural anatomy for placement and overlying skin marked.  Area of drainage cleaned and draped in sterile fashion. Lidocaine was used to anesthetize the skin and subcutaneous tissue.  725 cc's of cloudy appearing fluid was drained from the left pleural space. We stopped the procedure because the patient complained of pain at the insertion site.  Catheter then removed and bandaid applied to site.   Complications/Tolerance None; patient tolerated the procedure well. Chest X-ray is ordered to confirm no post-procedural complication.   EBL Minimal   Specimen(s) Pleural fluid    Roselie Awkward, MD [facility] Pager: [phone] Cell: [phone] After 7:00 pm call Elink  [phone]

## 2022-07-05 NOTE — Progress Notes (Signed)
Speech Language Pathology Treatment: Dysphagia  Patient Details Name: Robert [name]. MRN: [id number] DOB: [birth year] Today's Date: 07/05/2022 Time: 1210-1228 SLP Time Calculation (min) (ACUTE ONLY): 18 min  Assessment / Plan / Recommendation Clinical Impression  Pt seen for dysphagia treatment given esophagram may be completed 07/06/2022.  Pt wants to eat and drink - thus observed him with 2/3 of an Ensure - and 3 sips of tea.  No indication of aspiration with these textures.  Given pt reports food particle expectoration is a significant issue for him.  Thus, if he can not have esophagram today, recommend full liquid diet..  Reviewed importance of respiratory status for safer po intake, including assure pt is not dyspneic.  Demonstrated to pt and wife apnea that occurs with swallow to inform.  Wife and pt agreeable to a full liquid diet today -      Upon walking to nurses station, received notice that esophagram may possibly be done today  0 thus advised pt hold on po intake at this time. Informed xray of amount of po pt consumed including 2/3 of an Ensure, sips of tea and few sips of water.    Will follow up to aid in dysphagia management, indication for instrumental oropharyngeal evaluation as needed.  Thanks so much.     HPI HPI: 88 yo adm to [facility] with remote h/o lung cancer - adm with shortness of breath, cough, and weight loss.  Per MD note, The patient's wife provides most of the history because he says he has been having memory problems recently. Aspiration pna, Abnormal CT of spine (10/24/2018), Atrial fibrillation ([place]), Back pain, Bradycardia, Bronchiectasis ([place]), COPD, mild ([place]), colonoscopy (11/14/2007), CT scan of chest (08/23/2018), Hypothyroidism, Lung cancer ([place]), Neck pain, 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([place]), Pneumonia (2015), and Symptomatic bradycardia. Moderate size left pleural effusion has increased in size from prior imaging. Near complete consolidation of the left lower lobe, favor  pneumonia over compressive atelectasis. 2. Chronic area of consolidation in the right middle lobe with air bronchograms.  Pt is s/p thoracentesis today.  Swallow eval ordered due to pt overtly coughing with intake.      SLP Plan  Continue with current plan of care;Other (Comment) (esophagram)      Recommendations for follow up therapy are one component of a multi-disciplinary discharge planning process, led by the attending physician.  Recommendations may be updated based on patient status, additional functional criteria and insurance authorization.    Recommendations  Diet recommendations: Full Liquid if esophagram for 11/1, NPO pending esophagram if able to be done today Medication Administration: Whole meds with puree Supervision: Patient able to self feed Compensations: Slow rate;Small sips/bites;Other (Comment) Postural Changes and/or Swallow Maneuvers: Seated upright 90 degrees;Upright 30-60 min after meal                Oral Care Recommendations: Oral care BID Follow Up Recommendations: Follow physician's recommendations for discharge plan and follow up therapies Assistance recommended at discharge: Set up Supervision/Assistance SLP Visit Diagnosis: Dysphagia, unspecified (R13.10) Plan: Continue with current plan of care;Other (Comment) (esophagram)          Kathleen Lime, MS [facility] SLP Acute Rehab Services Office [phone] Pager [phone]  Macario Golds  07/05/2022, 1:13 PM

## 2022-07-05 NOTE — Consult Note (Signed)
   NAME:  Robert Vanoverbeke., MRN:  [id number], DOB:  10/08/1928, LOS: 1 ADMISSION DATE:  07/04/2022, CONSULTATION DATE:  10/30 REFERRING MD:  Marylyn Ishihara, CHIEF COMPLAINT:  Dyspnea   History of Present Illness:  88 year old male who I followed in the past for bronchiectasis and a distant history of lung cancer admitted on 10/31 in the setting of acute hypoxemic respiratory failure and a new left pleural effusion causing compressive atelectasis of the left lung.   Pertinent  Medical History  Right upper lobe 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removed in 1990, treated with radiation therapy Bronchiectasis Chronic aspiration Chronic back pain Hypothyroidism Atrial fibrillation, on Xarelto  Significant Hospital Events: Including procedures, antibiotic start and stop dates in addition to other pertinent events   October 30 CT chest images independently reviewed showing bronchiectasis and surgical changes in the right lung, moderate size pleural effusion with compressive atelectasis versus consolidation in the left lower lobe  Interim History / Subjective:  Slept OK No chest pain Dyspnea is manageable this morning  Objective   Blood pressure 120/70, pulse 66, temperature 97.7 F (36.5 C), temperature source Oral, resp. rate 17, height 6' (1.829 m), weight 74.1 kg, SpO2 96 %.        Intake/Output Summary (Last 24 hours) at 07/05/2022 0843 Last data filed at 07/05/2022 0700 Gross per 24 hour  Intake 408.74 ml  Output 900 ml  Net -491.26 ml   Filed Weights   07/04/22 1252 07/05/22 0500  Weight: 76.2 kg 74.1 kg    Examination:  General:  Resting comfortably in bed HENT: NCAT OP clear PULM: Diminished left base, clear on right, normal effort CV: RRR, no mgr GI: BS+, soft, nontender MSK: normal bulk and tone Neuro: awake, alert, no distress, MAEW    Resolved Hospital Problem list    Assessment & Plan:  Bronchiectasis with acute exacerbation Community-acquired pneumonia, at risk for resistant  organisms Acute respiratory failure with hypoxemia Compressive atelectasis of left lower lobe Moderate size pleural effusion on left Atrial fibrillation on Xarelto Dysphagia, recurrent aspiration  Discussion: See prior discussion notes.  I'm worried the left effusion could be a complicated parapneumonic effusion.  Plan: Continue Vancomycin and Cefepime Hypertonic saline bid Chest PT qid Sputum culture Guaifenesin Hold xarelto Plan thoracentesis this morning at 10:00 Wean off O2 for O2 saturation > 88% F/u SLP evaluation  We will follow  Best Practice (right click and "Reselect all SmartList Selections" daily)   Per TRH See code status discussion note from 10/30    Roselie Awkward, MD [facility] Pager: [phone] Cell: [phone] After 7:00 pm call Elink  [phone]

## 2022-07-05 NOTE — Progress Notes (Signed)
McQuaid MD to preform bedside thoracentesis. Consent obtained and time out completed. Vitals monitored during whole procedure. Bedside RN aware or procedure completion and vital monitoring.   SpO2 92% on 8L [place] at beginning of procedure, SpO2 increased to 98% 8L [place] after procedure completed. No signs of respiratory distress at this time. BP 118/65. Chest x ray placed by MD- bedside RN aware

## 2022-07-05 NOTE — Progress Notes (Signed)
PROGRESS NOTE    Robert [name].  NFA:[id number] DOB: [birth year] DOA: 07/04/2022 PCP: [name], MD    Brief Narrative:  88 year old with hypothyroidism, COPD, paroxysmal atrial fibrillation on Xarelto, complete heart block status post pacemaker, CKD stage IIIb presented with SOB .  Does not wear oxygen at home.  He went to pulmonology office and found to be on room air and 82% and cyanotic. CT scan showed bronchiectasis, left sided consolidation and pleural effusion.  Patient was treated with antibiotics from clinic for more than 2 weeks.  He also has history of significant dysphagia.   Assessment & Plan:  Left lower lobe pneumonia with parapneumonic effusion, acute hypoxemic respiratory failure as evidenced by saturation 82% on arrival.  Failed outpatient therapies. Patient remains on vancomycin and cefepime given suspected complicated pneumonia and failed outpatient therapy.  Cultures are negative so far. Chest physiotherapy, incentive spirometry, deep breathing exercises, sputum induction, mucolytic's and bronchodilators. Sputum cultures, blood cultures, Legionella and streptococcal antigen.  Negative so far. Supplemental oxygen to keep saturations more than 90%. Complex left pleural effusion, thoracentesis at bedside and 750 mL removed.  Cultures pending. Seen by speech therapy.  May have chronic aspiration. Barium esophagogram ordered for tomorrow due to abnormal esophagus.  May have achalasia.  Chronic medical issues including Hypothyroidism COPD Paroxysmal A-fib on Xarelto CKD stage IIIb Chronic diastolic heart failure  Remained stable.   DVT prophylaxis:   Resume Xarelto tonight if okay with pulmonary.   Code Status: Full code Family Communication: Wife at the bedside Disposition Plan: Status is: Inpatient Remains inpatient appropriate because: IV antibiotics.  Inpatient procedures.     Consultants:  PCCM  Procedures:   Thoracentesis  Antimicrobials:  Vancomycin cefepime 10/30----   Subjective: Patient was seen and examined.  No overnight events.  Feels much better breathing today.  Still on 8 L oxygen but able to take deep breaths in.  Objective: Vitals:   07/05/22 0728 07/05/22 0733 07/05/22 0753 07/05/22 1239  BP:    114/81  Pulse:    69  Resp:  17  20  Temp:    97.6 F (36.4 C)  TempSrc:    Oral  SpO2: 96%  96% 98%  Weight:      Height:        Intake/Output Summary (Last 24 hours) at 07/05/2022 1332 Last data filed at 07/05/2022 0700 Gross per 24 hour  Intake 408.74 ml  Output 900 ml  Net -491.26 ml   Filed Weights   07/04/22 1252 07/05/22 0500  Weight: 76.2 kg 74.1 kg    Examination:  General exam: Appears calm and comfortable  Respiratory system: Poor air entry on the left base.  Otherwise without any respiratory distress. Cardiovascular system: S1 & S2 heard, RRR.  Pacemaker in place.  Trace bilateral pedal edema.  Gastrointestinal system: Abdomen is nondistended, soft and nontender. No organomegaly or masses felt. Normal bowel sounds heard. Central nervous system: Alert and oriented. No focal neurological deficits. Extremities: Symmetric 5 x 5 power. Skin: No rashes, lesions or ulcers Psychiatry: Judgement and insight appear normal. Mood & affect appropriate.     Data Reviewed: I have personally reviewed following labs and imaging studies  CBC: Recent Labs  Lab 07/04/22 1406 07/05/22 0519  WBC 9.0 14.2*  NEUTROABS 7.9*  --   HGB 11.3* 11.2*  HCT 35.0* 35.2*  MCV 103.6* 104.5*  PLT 178 469   Basic Metabolic Panel: Recent Labs  Lab 07/04/22 1406 07/05/22 0519  NA  137 140  K 3.8 3.9  CL 102 106  CO2 24 24  GLUCOSE 120* 139*  BUN 37* 44*  CREATININE 1.90* 1.92*  CALCIUM 9.0 9.0   GFR: Estimated Creatinine Clearance: 26.3 mL/min (A) (by C-G formula based on SCr of 1.92 mg/dL (H)). Liver Function Tests: Recent Labs  Lab 07/04/22 1406 07/05/22 0519   AST 27 34  ALT 17 19  ALKPHOS 63 60  BILITOT 1.5* 1.0  PROT 8.0 7.7  ALBUMIN 3.4* 3.2*   No results for input(s): "LIPASE", "AMYLASE" in the last 168 hours. No results for input(s): "AMMONIA" in the last 168 hours. Coagulation Profile: No results for input(s): "INR", "PROTIME" in the last 168 hours. Cardiac Enzymes: No results for input(s): "CKTOTAL", "CKMB", "CKMBINDEX", "TROPONINI" in the last 168 hours. BNP (last 3 results) No results for input(s): "PROBNP" in the last 8760 hours. HbA1C: No results for input(s): "HGBA1C" in the last 72 hours. CBG: No results for input(s): "GLUCAP" in the last 168 hours. Lipid Profile: No results for input(s): "CHOL", "HDL", "LDLCALC", "TRIG", "CHOLHDL", "LDLDIRECT" in the last 72 hours. Thyroid Function Tests: No results for input(s): "TSH", "T4TOTAL", "FREET4", "T3FREE", "THYROIDAB" in the last 72 hours. Anemia Panel: Recent Labs    07/04/22 1812  VITAMINB12 1,112*  FOLATE 33.5   Sepsis Labs: No results for input(s): "PROCALCITON", "LATICACIDVEN" in the last 168 hours.  Recent Results (from the past 240 hour(s))  SARS Coronavirus 2 by RT PCR (hospital order, performed in [facility] hospital lab) *cepheid single result test* Anterior Nasal Swab     Status: None   Collection Time: 07/04/22  2:50 PM   Specimen: Anterior Nasal Swab  Result Value Ref Range Status   SARS Coronavirus 2 by RT PCR NEGATIVE NEGATIVE Final    Comment: (NOTE) SARS-CoV-2 target nucleic acids are NOT DETECTED.  The SARS-CoV-2 RNA is generally detectable in upper and lower respiratory specimens during the acute phase of infection. The lowest concentration of SARS-CoV-2 viral copies this assay can detect is 250 copies / mL. A negative result does not preclude SARS-CoV-2 infection and should not be used as the sole basis for treatment or other patient management decisions.  A negative result may occur with improper specimen collection / handling, submission of  specimen other than nasopharyngeal swab, presence of viral mutation(s) within the areas targeted by this assay, and inadequate number of viral copies (<250 copies / mL). A negative result must be combined with clinical observations, patient history, and epidemiological information.  Fact Sheet for Patients:   https://www.patel.info/  Fact Sheet for Healthcare Providers: https://hall.com/  This test is not yet approved or  cleared by the [place] FDA and has been authorized for detection and/or diagnosis of SARS-CoV-2 by FDA under an Emergency Use Authorization (EUA).  This EUA will remain in effect (meaning this test can be used) for the duration of the COVID-19 declaration under Section 564(b)(1) of the Act, 21 U.S.C. section 360bbb-3(b)(1), unless the authorization is terminated or revoked sooner.  Performed at [facility], [place] [address] [place],  [address]          Radiology Studies: DG CHEST PORT 1 VIEW  Result Date: 07/05/2022 CLINICAL DATA:  Provided history: Status post thoracentesis. EXAM: PORTABLE CHEST 1 VIEW COMPARISON:  Prior chest radiographs 07/04/2022 and earlier. Chest CT 07/04/2022. FINDINGS: Left chest dual lead pacer device. Cardiomegaly, unchanged. Aortic atherosclerosis. Persistent small left pleural effusion, decreased in size from the prior examination of 07/04/2022. Associated left  basilar atelectasis and/or consolidation. Focus of chronic consolidation within the right middle lobe, more fully characterized on yesterday's chest CT. No evidence of pneumothorax. No acute bony abnormality identified. IMPRESSION: No evidence of pneumothorax status post thoracentesis. Small left pleural effusion, decreased in size. Associated left basilar atelectasis and/or consolidation. Focus of chronic consolidation within the right middle lobe, more fully characterized on yesterday's chest CT. Please  refer to this prior examination for further description. Cardiomegaly. Aortic Atherosclerosis (ICD10-I70.0). Electronically Signed   By: Kellie Simmering D.O.   On: 07/05/2022 11:21   ECHOCARDIOGRAM COMPLETE  Result Date: 07/05/2022    ECHOCARDIOGRAM REPORT   Patient Name:   Robert [name]. Date of Exam: 07/05/2022 Medical Rec #:  [id number]           Height:       72.0 in Accession #:    [id number]          Weight:       163.4 lb Date of Birth:  11-22-1928           BSA:          1.955 m Patient Age:    78 years            BP:           120/70 mmHg Patient Gender: M                   HR:           66 bpm. Exam Location:  Inpatient Procedure: 2D Echo                                 MODIFIED REPORT: This report was modified by Rudean Haskell MD on 07/05/2022 due to comment                           on study quality/difficulty.  Indications:     CHF  History:         Patient has no prior history of Echocardiogram examinations.                  CHF, COPD, Arrythmias:Atrial Fibrillation; Risk                  Factors:Dyslipidemia.  Sonographer:     Harvie Junior Referring Phys:  [id number] Jonnie Finner Diagnosing Phys: Rudean Haskell MD  Sonographer Comments: Technically difficult study due to poor echo windows. Image acquisition challenging due to respiratory motion and Image acquisition challenging due to COPD. IMPRESSIONS  1. Left ventricular ejection fraction, by estimation, is 50 to 55%. The left ventricle has low normal function. The left ventricle has no regional wall motion abnormalities. Left ventricular diastolic parameters are indeterminate.  2. Right ventricular systolic function is normal. The right ventricular size is normal. A device lead not well visualized in this study is visualized. There is normal pulmonary artery systolic pressure.  3. Left atrial size was mildly dilated.  4. The mitral valve is normal in structure. No evidence of mitral valve regurgitation. No evidence of mitral  stenosis.  5. The aortic valve was not well visualized. Aortic valve regurgitation is not visualized. No aortic stenosis is present.  6. Technically difficult study. Comparison(s): No prior Echocardiogram. FINDINGS  Left Ventricle: Left ventricular ejection fraction, by estimation, is 50 to 55%. The left ventricle has low normal function.  The left ventricle has no regional wall motion abnormalities. The left ventricular internal cavity size was normal in size. There is no left ventricular hypertrophy. Left ventricular diastolic parameters are indeterminate. Right Ventricle: The right ventricular size is normal. No increase in right ventricular wall thickness. Right ventricular systolic function is normal. There is normal pulmonary artery systolic pressure. The tricuspid regurgitant velocity is 2.39 m/s, and  with an assumed right atrial pressure of 3 mmHg, the estimated right ventricular systolic pressure is 97.6 mmHg. Left Atrium: Left atrial size was mildly dilated. Right Atrium: Right atrial size was normal in size. Pericardium: There is no evidence of pericardial effusion. Mitral Valve: The mitral valve is normal in structure. No evidence of mitral valve regurgitation. No evidence of mitral valve stenosis. Tricuspid Valve: The tricuspid valve is normal in structure. Tricuspid valve regurgitation is not demonstrated. No evidence of tricuspid stenosis. Aortic Valve: The aortic valve was not well visualized. Aortic valve regurgitation is not visualized. No aortic stenosis is present. Aortic valve mean gradient measures 4.0 mmHg. Aortic valve peak gradient measures 6.9 mmHg. Aortic valve area, by VTI measures 2.96 cm. Pulmonic Valve: The pulmonic valve was not well visualized. Pulmonic valve regurgitation is not visualized. Aorta: The aortic root and ascending aorta are structurally normal, with no evidence of dilitation. IAS/Shunts: No atrial level shunt detected by color flow Doppler. Additional Comments: A  device lead not well visualized in this study is visualized.  LEFT VENTRICLE PLAX 2D LVIDd:         4.10 cm     Diastology LVIDs:         2.80 cm     LV e' medial:    6.53 cm/s LV PW:         0.80 cm     LV E/e' medial:  15.8 LV IVS:        0.80 cm     LV e' lateral:   7.40 cm/s LVOT diam:     2.10 cm     LV E/e' lateral: 13.9 LV SV:         63 LV SV Index:   32 LVOT Area:     3.46 cm  LV Volumes (MOD) LV vol d, MOD A2C: 68.5 ml LV vol d, MOD A4C: 94.8 ml LV vol s, MOD A2C: 28.0 ml LV vol s, MOD A4C: 44.7 ml LV SV MOD A2C:     40.5 ml LV SV MOD A4C:     94.8 ml LV SV MOD BP:      45.3 ml RIGHT VENTRICLE RV Basal diam:  3.30 cm RV Mid diam:    2.70 cm TAPSE (M-mode): 2.4 cm LEFT ATRIUM             Index        RIGHT ATRIUM           Index LA diam:        2.90 cm 1.48 cm/m   RA Area:     31.50 cm LA Vol (A2C):   74.2 ml 37.95 ml/m  RA Volume:   119.00 ml 60.87 ml/m LA Vol (A4C):   66.4 ml 33.96 ml/m LA Biplane Vol: 71.6 ml 36.62 ml/m  AORTIC VALVE                    PULMONIC VALVE AV Area (Vmax):    2.16 cm     PV Vmax:       0.87 m/s AV Area (Vmean):  2.41 cm     PV Peak grad:  3.0 mmHg AV Area (VTI):     2.96 cm AV Vmax:           131.00 cm/s AV Vmean:          89.400 cm/s AV VTI:            0.214 m AV Peak Grad:      6.9 mmHg AV Mean Grad:      4.0 mmHg LVOT Vmax:         81.60 cm/s LVOT Vmean:        62.200 cm/s LVOT VTI:          0.183 m LVOT/AV VTI ratio: 0.86  AORTA Ao Root diam: 3.40 cm Ao Asc diam:  2.70 cm MITRAL VALVE                TRICUSPID VALVE MV Area (PHT): 4.60 cm     TR Peak grad:   22.8 mmHg MV Decel Time: 165 msec     TR Vmax:        239.00 cm/s MR Peak grad: 28.1 mmHg MR Vmax:      265.00 cm/s   SHUNTS MV E velocity: 103.00 cm/s  Systemic VTI:  0.18 m MV A velocity: 38.10 cm/s   Systemic Diam: 2.10 cm MV E/A ratio:  2.70 Rudean Haskell MD Electronically signed by Rudean Haskell MD Signature Date/Time: 07/05/2022/11:10:30 AM    Final (Updated)    US RENAL  Result Date:  07/04/2022 CLINICAL DATA:  76 year old with acute kidney injury. EXAM: RENAL / URINARY TRACT ULTRASOUND COMPLETE COMPARISON:  None Available. FINDINGS: Right Kidney: Renal measurements: 10.6 x 4.2 x 4.7 cm = volume: 107 mL. Normal parenchymal echogenicity. No hydronephrosis. No visualized stone or focal lesion. Left Kidney: Renal measurements: 10.3 x 4.9 x 5 cm = volume: 132 mL. Normal parenchymal echogenicity. No hydronephrosis. No visualized stone or focal lesion. Bladder: Appears normal for degree of bladder distention. Other: None. IMPRESSION: No obstructive uropathy. No acute findings or explanation for acute kidney injury. Electronically Signed   By: Keith Rake M.D.   On: 07/04/2022 21:10   CT Chest Wo Contrast  Result Date: 07/04/2022 CLINICAL DATA:  Pneumonia, complication suspected, xray done EXAM: CT CHEST WITHOUT CONTRAST TECHNIQUE: Multidetector CT imaging of the chest was performed following the standard protocol without IV contrast. RADIATION DOSE REDUCTION: This exam was performed according to the departmental dose-optimization program which includes automated exposure control, adjustment of the mA and/or kV according to patient size and/or use of iterative reconstruction technique. COMPARISON:  Radiograph earlier today. PET CT 10/25/2021, chest CT 09/10/2021 FINDINGS: Cardiovascular: Left-sided pacemaker in place with lead tips projecting over the right atrium and ventricle. Multi chamber cardiomegaly. Prominent main pulmonary artery at 3.3 cm. Small amount of fluid in the superior pericardial recess without significant pericardial effusion. Aortic atherosclerosis and tortuosity. No periaortic stranding. Mediastinum/Nodes: 14 mm subcarinal node series 2, image 66, previously 20 mm. 15 mm low right infrahilar node, series 2, image 84, unchanged. Additional scattered small mediastinal lymph nodes that are not enlarged by size criteria in similar to prior exam. Assessment for hilar  adenopathy is significantly limited in the absence of IV contrast. Patulous esophagus with retained debris. Lungs/Pleura: Moderate size left pleural effusion has increased in size from prior imaging. Small amount of fluid tracks in the inter lobar fissure. Near complete consolidation of the left lower lobe. Postsurgical volume loss in the right hemithorax. Chronic area  of consolidation in the right middle lobe with air bronchograms, unchanged in appearance from prior imaging. Areas of ground-glass opacity in septal thickening in the right lower lobe, also chronic and similar in appearance to prior exam. Upper Abdomen: No acute upper abdominal findings. Musculoskeletal: Chronic C7 compression fracture. Chronic T7 compression fracture. Postsurgical change of the right upper hemithorax. Thoracic spondylosis with spurring. Pectus excavatum deformity. No focal bone lesions. The bones are diffusely under mineralized. IMPRESSION: 1. Moderate size left pleural effusion has increased in size from prior imaging. Near complete consolidation of the left lower lobe, favor pneumonia over compressive atelectasis. 2. Chronic area of consolidation in the right middle lobe with air bronchograms, unchanged in appearance from prior imaging. Scarring is favored. 3. Areas of ground-glass opacity and septal thickening in the right lower lobe are also chronic and similar in appearance to prior imaging. 4. Cardiomegaly. Prominent main pulmonary artery suggesting pulmonary arterial hypertension. 5. Patulous esophagus with retained debris. Aortic Atherosclerosis (ICD10-I70.0). Electronically Signed   By: Keith Rake M.D.   On: 07/04/2022 15:36   DG Chest Port 1 View  Result Date: 07/04/2022 CLINICAL DATA:  Shortness of breath EXAM: PORTABLE CHEST 1 VIEW COMPARISON:  10/25/2021, 08/12/2021 FINDINGS: Left-sided implanted cardiac device remains in place. Cardiomegaly. Aortic atherosclerosis. Moderate-sized left-sided pleural effusion  with associated left basilar opacity. No pneumothorax. Postsurgical changes of the upper right hemithorax. IMPRESSION: New moderate-sized left-sided pleural effusion with associated left basilar opacity. Electronically Signed   By: Davina Poke D.O.   On: 07/04/2022 13:11        Scheduled Meds:  docusate sodium  100 mg Oral BID   fluticasone furoate-vilanterol  1 puff Inhalation Daily   And   umeclidinium bromide  1 puff Inhalation Daily   furosemide  40 mg Intravenous Daily   guaiFENesin  1,200 mg Oral BID   levothyroxine  125 mcg Oral Q0600   sodium chloride HYPERTONIC  4 mL Nebulization BID   Continuous Infusions:  ceFEPime (MAXIPIME) IV Stopped (07/04/22 1902)   vancomycin 1,500 mg (07/04/22 1904)     LOS: 1 day    Time spent: 35 minutes    Barb Merino, MD [facility] Pager [phone]

## 2022-07-06 ENCOUNTER — Inpatient Hospital Stay (HOSPITAL_COMMUNITY): Payer: Medicare Other

## 2022-07-06 DIAGNOSIS — J9601 Acute respiratory failure with hypoxia: Secondary | ICD-10-CM | POA: Diagnosis not present

## 2022-07-06 LAB — MRSA NEXT GEN BY PCR, NASAL: MRSA by PCR Next Gen: NOT DETECTED

## 2022-07-06 LAB — BODY FLUID CELL COUNT WITH DIFFERENTIAL
Lymphs, Fluid: 3 %
Monocyte-Macrophage-Serous Fluid: 10 % — ABNORMAL LOW (ref 50–90)
Neutrophil Count, Fluid: 87 % — ABNORMAL HIGH (ref 0–25)
Total Nucleated Cell Count, Fluid: 8183 cu mm — ABNORMAL HIGH (ref 0–1000)

## 2022-07-06 LAB — LACTATE DEHYDROGENASE, PLEURAL OR PERITONEAL FLUID: LD, Fluid: 1077 U/L — ABNORMAL HIGH (ref 3–23)

## 2022-07-06 LAB — GLUCOSE, PLEURAL OR PERITONEAL FLUID: Glucose, Fluid: 53 mg/dL

## 2022-07-06 LAB — CYTOLOGY - NON PAP

## 2022-07-06 LAB — PROTEIN, PLEURAL OR PERITONEAL FLUID: Total protein, fluid: 4.3 g/dL

## 2022-07-06 LAB — ALBUMIN, PLEURAL OR PERITONEAL FLUID: Albumin, Fluid: 2.2 g/dL

## 2022-07-06 LAB — CREATININE, SERUM
Creatinine, Ser: 1.56 mg/dL — ABNORMAL HIGH (ref 0.61–1.24)
GFR, Estimated: 42 mL/min — ABNORMAL LOW (ref >60)

## 2022-07-06 MED ORDER — SODIUM CHLORIDE 0.9% FLUSH
10.0000 mL | Freq: Three times a day (TID) | INTRAVENOUS | Status: DC
  Administered 2022-07-06 – 2022-07-07 (×3): 10 mL via INTRAPLEURAL

## 2022-07-06 MED ORDER — VANCOMYCIN HCL 750 MG/150ML IV SOLN
750.0000 mg | INTRAVENOUS | Status: DC
  Administered 2022-07-06 – 2022-07-07 (×2): 750 mg via INTRAVENOUS
  Filled 2022-07-06 (×2): qty 150

## 2022-07-06 MED ORDER — SODIUM CHLORIDE 0.9 % IV SOLN
2.0000 g | Freq: Two times a day (BID) | INTRAVENOUS | Status: DC
  Administered 2022-07-06 – 2022-07-08 (×4): 2 g via INTRAVENOUS
  Filled 2022-07-06 (×4): qty 12.5

## 2022-07-06 NOTE — TOC Initial Note (Signed)
Transition of Care (TOC) - Initial/Assessment Note    Patient Details  Name: Robert [name]. MRN: [id number] Date of Birth: [birth year]  Transition of [facility]) CM/SW Contact:    Vassie Moselle, LCSW Phone Number: 07/06/2022, 12:48 PM  Clinical Narrative:                 TOC following for discharge needs for this pt. Pt currently on 5L HFNC.   Expected Discharge Plan: Home/Self Care Barriers to Discharge: Continued Medical Work up   Patient Goals and CMS Choice Patient states their goals for this hospitalization and ongoing recovery are:: Unable to assess   Choice offered to / list presented to : Patient  Expected Discharge Plan and Services Expected Discharge Plan: Home/Self Care In-house Referral: NA Discharge Planning Services: NA   Living arrangements for the past 2 months: Apartment                                      Prior Living Arrangements/Services Living arrangements for the past 2 months: Apartment Lives with:: Spouse Patient language and need for interpreter reviewed:: Yes Do you feel safe going back to the place where you live?: Yes      Need for Family Participation in Patient Care: No (Comment) Care giver support system in place?: No (comment) Current home services: DME Criminal Activity/Legal Involvement Pertinent to Current Situation/Hospitalization: No - Comment as needed  Activities of Daily Living Home Assistive Devices/Equipment: Eyeglasses, Cane (specify quad or straight) ADL Screening (condition at time of admission) Patient's cognitive ability adequate to safely complete daily activities?: Yes Is the patient deaf or have difficulty hearing?: No Does the patient have difficulty seeing, even when wearing glasses/contacts?: No Does the patient have difficulty concentrating, remembering, or making decisions?: No Patient able to express need for assistance with ADLs?: Yes Does the patient have difficulty dressing or bathing?:  No Independently performs ADLs?: Yes (appropriate for developmental age) Does the patient have difficulty walking or climbing stairs?: No Weakness of Legs: Both Weakness of Arms/Hands: Both  Permission Sought/Granted                  Emotional Assessment   Attitude/Demeanor/Rapport: Unable to Assess Affect (typically observed): Unable to Assess Orientation: : Oriented to Self, Oriented to Place, Oriented to Situation, Oriented to  Time Alcohol / Substance Use: Not Applicable Psych Involvement: No (comment)  Admission diagnosis:  Pleural effusion [J90] Acute respiratory failure with hypoxia (HCC) [J96.01] Patient Active Problem List   Diagnosis Date Noted   Acute respiratory failure with hypoxia ([place]) 07/04/2022   AKI (acute kidney injury) ([place]) 07/04/2022   COPD (chronic obstructive pulmonary disease) ([place]) 07/04/2022   Macrocytic anemia 07/04/2022   Hypothyroidism, unspecified 11/19/2020   Stage 3b chronic kidney disease (CKD) ([place]) 11/19/2020   Acute on chronic diastolic CHF (congestive heart failure) ([place]) 11/19/2020   Osteoarthritis (arthritis due to wear and tear of joints) 11/26/2019   Bronchiectasis with (acute) exacerbation ([place]) 01/25/2019   Bronchiectasis without complication ([place]) 76/73/4193   Black stools 02/19/2016   Chronic anticoagulation 02/19/2016   Peripheral edema 10/02/2015   Atrial fibrillation ([place]) 07/11/2012   Long term (current) use of anticoagulants 07/11/2012   DYSLIPIDEMIA 09/28/2009   Sinus node dysfunction ([place]) 09/28/2009   PPM-Medtr[employer] 09/28/2009   PCP:  [name], MD Pharmacy:   [facility] [address] [place], [place]  [address] [place] [address] Phone: [phone] Fax: [phone]  [place], [place] [address] Phone: [phone] Fax: [place], [place] [address] Phone: [phone] Fax: [phone]  [id number] [place], [place] [address] Phone: [phone] Fax: [phone]     Social Determinants of Health (SDOH) Interventions    Readmission Risk Interventions    07/06/2022   12:45 PM  Readmission Risk Prevention Plan  Post Dischage Appt Complete  Medication Screening Complete  Transportation Screening Complete

## 2022-07-06 NOTE — Procedures (Signed)
Insertion of Chest Tube Procedure Note  Romuald Mccaslin  [id number]  Jan 08, 1929  Date:07/06/22  Time:5:23 PM    Provider Performing: Candee Furbish   Procedure: Pleural Catheter Insertion w/ Imaging Guidance [id number])  Indication(s) Effusion  Consent Risks of the procedure as well as the alternatives and risks of each were explained to the patient and/or caregiver.  Consent for the procedure was obtained and is signed in the bedside chart  Anesthesia Topical only with 1% lidocaine    Time Out Verified patient identification, verified procedure, site/side was marked, verified correct patient position, special equipment/implants available, medications/allergies/relevant history reviewed, required imaging and test results available.   Sterile Technique Maximal sterile technique including full sterile barrier drape, hand hygiene, sterile gown, sterile gloves, mask, hair covering, sterile ultrasound probe cover (if used).   Procedure Description Ultrasound used to identify appropriate pleural anatomy for placement and overlying skin marked. Area of placement cleaned and draped in sterile fashion.  A 14 French pigtail pleural catheter was placed into the left pleural space using Seldinger technique. Appropriate return of fluid was obtained.  The tube was connected to atrium and placed on -20 cm H2O wall suction.   Complications/Tolerance None; patient tolerated the procedure well. Chest X-ray is ordered to verify placement.   EBL Minimal  Specimen(s) none

## 2022-07-06 NOTE — Progress Notes (Signed)
Pharmacy Antibiotic Note  Robert [name]. is a 88 y.o. male hx CKD, multi lobar bronchiectasis and lung cancer who presented to the ED on 07/04/2022 with SOB and cough.  Chest CT on 07/04/22 showed moderate size left pleural effusion and near complete consolidation of the LLL consistent with PNA. He underwent  thoracentesis on 07/05/22. He's currently  vancomycin and cefepime for PNA.  Today, 07/06/2022: - day #3 abx - scr down 1.56 (crcl~31) - afeb - MRSA pcr pending  Plan: - adjust cefepime to 2gm IV q12h and vancomycin to 750 mg IV q24h for est AUC 487 - f/u renal function  ______________________________________  Height: 6' (182.9 cm) Weight: 71.2 kg (156 lb 15.5 oz) IBW/kg (Calculated) : 77.6  Temp (24hrs), Avg:97.9 F (36.6 C), Min:97.5 F (36.4 C), Max:98.3 F (36.8 C)  Recent Labs  Lab 07/04/22 1406 07/05/22 0519 07/06/22 1219  WBC 9.0 14.2*  --   CREATININE 1.90* 1.92* 1.56*    Estimated Creatinine Clearance: 31.1 mL/min (A) (by C-G formula based on SCr of 1.56 mg/dL (H)).    No Known Allergies  10/30 CTX/Azithro x 1 10/30 Vancomycin >> 10/30 Cefepime >>  10/30 legionella antigen:  10/31 strep pneumo antigen: neg 10/31 pleural fluid:  10/31 pleural fluid fungus: 10/31 pleural fluid AFB:  Thank you for allowing pharmacy to be a part of this patient's care.  Lynelle Doctor 07/06/2022 1:58 PM

## 2022-07-06 NOTE — Progress Notes (Signed)
Speech Language Pathology Treatment: Dysphagia  Patient Details Name: Robert [name]. MRN: [id number] DOB: 23-Dec-1928 Today's Date: 07/06/2022 Time: 4818-5631 SLP Time Calculation (min) (ACUTE ONLY): 26 min  Assessment / Plan / Recommendation Clinical Impression  Pt seen for dysphagia treatment after esophagram completed 07/05/2022 showing esophageal dysmotility.  Provided pt and his wife compensation strategies to mitigate aspiration but advised would not be prevented.  Wife inquired re: utility of thickener for pt.  PO trials commenced to assess clinical tolerance including with nectar thick juice, hot tea, and graham crackers.  Pt endorsed sensation of "slow clearance" in esophagus with nectar via thin.  He takes very small bites of solid and used tea to help clear.  Recommend strict esophageal precautions including trying hot tea, consuming several small meals, stopping intake if sense retention that does not clear or he is coughing with intake.    Wife reports progressive weight loss from 197 March 2022 to 176 September 2022.  Given progressive weight loss and suspected exacerbation of baseline dysphagia, recommend consider RD consult.    Wife reports pt takes his medications with protein shake at home - and recommend continue this to help with swallowing.    Reviewed flouro loops of pt's esophagram and he advised he was sensate to retention in esophagus during exam.    Will follow up x1 to assure managing his dysphagia as optimally possible.  Significant caution advised with po currently given respiratory status.  Pt and wife are making significant progress with understanding his diagnosis and management.       HPI HPI: 88 yo adm to [facility] with remote h/o lung cancer - adm with shortness of breath, cough, and weight loss.  Per MD note, The patient's wife provides most of the history because he says he has been having memory problems recently. Aspiration pna, Abnormal CT of spine (10/24/2018),  Atrial fibrillation ([name]), Back pain, Bradycardia, Bronchiectasis ([name]), COPD, mild ([name]), colonoscopy (11/14/2007), CT scan of chest (08/23/2018), Hypothyroidism, Lung cancer ([name]), Neck pain, 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([name]), Pneumonia (2015), and Symptomatic bradycardia. Moderate size left pleural effusion has increased in size from prior imaging. Near complete consolidation of the left lower lobe, favor pneumonia over compressive atelectasis. 2. Chronic area of consolidation in the right middle lobe with air bronchograms.  Pt is s/p thoracentesis today.  Swallow eval ordered due to pt overtly coughing with intake.      SLP Plan  Continue with current plan of care;Other (Comment)      Recommendations for follow up therapy are one component of a multi-disciplinary discharge planning process, led by the attending physician.  Recommendations may be updated based on patient status, additional functional criteria and insurance authorization.    Recommendations  Diet recommendations: Thin liquid;Nectar-thick liquid Liquids provided via: Cup;Straw Medication Administration: Whole meds with liquid (whole with Ensure) Supervision: Patient able to self feed (start intake with liquids) Compensations: Slow rate;Small sips/bites;Other (Comment) Postural Changes and/or Swallow Maneuvers: Seated upright 90 degrees;Upright 30-60 min after meal                Oral Care Recommendations: Oral care BID Follow Up Recommendations: Follow physician's recommendations for discharge plan and follow up therapies Assistance recommended at discharge: Set up Supervision/Assistance SLP Visit Diagnosis: Dysphagia, unspecified (R13.10) Plan: Continue with current plan of care;Other (Comment)         Kathleen Lime, MS [facility] SLP Acute Rehab Services Office [phone] Pager [phone]   Macario Golds  07/06/2022, 11:33 AM

## 2022-07-06 NOTE — Progress Notes (Signed)
PROGRESS NOTE    Robert [name].  AJO:[id number] DOB: 03/06/1929 DOA: 07/04/2022 PCP: [name], MD    Brief Narrative:  88 year old with hypothyroidism, COPD, paroxysmal atrial fibrillation on Xarelto, complete heart block status post pacemaker, CKD stage IIIb presented with SOB .  Does not wear oxygen at home.  He went to pulmonology office and found to be on room air and 82% and cyanotic. CT scan showed bronchiectasis, left sided consolidation and pleural effusion.  Patient was treated with antibiotics from clinic for more than 2 weeks.  He also has history of significant dysphagia.   Assessment & Plan:  Left lower lobe pneumonia with parapneumonic effusion, acute hypoxemic respiratory failure as evidenced by saturation 82% on arrival.  Failed outpatient therapies. Continue vancomycin and cefepime to treat complicated pneumonia.  Cultures negative so far.   Thoracentesis left chest wall, 758 removed, LDH more than thousand and cell count more than 8000.  Cultures pending.  Probably loculated empyema, pulmonary following.  May benefit with chest tube and Lytics. Continue aggressive chest physiotherapy, incentive, mucolytic's and bronchodilators.   Mobilize with PT OT today.   Significant dysphagia, barium esophagogram shows tortuous esophagus and probably contributing to chronic aspiration.  Aspiration precautions.  Unlikely patient has any surgical intervention that will be helpful.    Chronic medical issues including Hypothyroidism COPD Paroxysmal A-fib on Xarelto CKD stage IIIb Chronic diastolic heart failure Remain stable.   DVT prophylaxis:    Rivaroxaban (XARELTO) tablet 15 mg   Code Status: Full code Family Communication: None today. Disposition Plan: Status is: Inpatient Remains inpatient appropriate because: IV antibiotics.  Inpatient procedures.     Consultants:  PCCM  Procedures:  Thoracentesis  Antimicrobials:  Vancomycin cefepime  10/30----   Subjective: Patient seen and examined.  No overnight events.  Feels much better.  Breathing better and taking deep breaths it.  Afebrile.  Pleasant and eager to walk.  Objective: Vitals:   07/06/22 0430 07/06/22 0500 07/06/22 0736 07/06/22 0738  BP: 127/67     Pulse: 69     Resp: 20  20   Temp: 98.3 F (36.8 C)     TempSrc: Oral     SpO2: 98%  96% 96%  Weight:  71.2 kg    Height:        Intake/Output Summary (Last 24 hours) at 07/06/2022 1311 Last data filed at 07/06/2022 0500 Gross per 24 hour  Intake 696 ml  Output 2320 ml  Net -1624 ml   Filed Weights   07/04/22 1252 07/05/22 0500 07/06/22 0500  Weight: 76.2 kg 74.1 kg 71.2 kg    Examination:  General exam: Appears calm and comfortable .currently on 3 L oxygen. Respiratory system: Poor air entry on the left base.  Otherwise without any respiratory distress. Cardiovascular system: S1 & S2 heard, RRR.  Pacemaker in place.  Trace bilateral pedal edema.  Gastrointestinal system: Abdomen is nondistended, soft and nontender. No organomegaly or masses felt. Normal bowel sounds heard. Central nervous system: Alert and oriented. No focal neurological deficits. Extremities: Symmetric 5 x 5 power. Skin: No rashes, lesions or ulcers Psychiatry: Judgement and insight appear normal. Mood & affect appropriate.     Data Reviewed: I have personally reviewed following labs and imaging studies  CBC: Recent Labs  Lab 07/04/22 1406 07/05/22 0519  WBC 9.0 14.2*  NEUTROABS 7.9*  --   HGB 11.3* 11.2*  HCT 35.0* 35.2*  MCV 103.6* 104.5*  PLT 178 947   Basic Metabolic  Panel: Recent Labs  Lab 07/04/22 1406 07/05/22 0519 07/06/22 1219  NA 137 140  --   K 3.8 3.9  --   CL 102 106  --   CO2 24 24  --   GLUCOSE 120* 139*  --   BUN 37* 44*  --   CREATININE 1.90* 1.92* 1.56*  CALCIUM 9.0 9.0  --    GFR: Estimated Creatinine Clearance: 31.1 mL/min (A) (by C-G formula based on SCr of 1.56 mg/dL (H)). Liver  Function Tests: Recent Labs  Lab 07/04/22 1406 07/05/22 0519  AST 27 34  ALT 17 19  ALKPHOS 63 60  BILITOT 1.5* 1.0  PROT 8.0 7.7  ALBUMIN 3.4* 3.2*   No results for input(s): "LIPASE", "AMYLASE" in the last 168 hours. No results for input(s): "AMMONIA" in the last 168 hours. Coagulation Profile: No results for input(s): "INR", "PROTIME" in the last 168 hours. Cardiac Enzymes: No results for input(s): "CKTOTAL", "CKMB", "CKMBINDEX", "TROPONINI" in the last 168 hours. BNP (last 3 results) No results for input(s): "PROBNP" in the last 8760 hours. HbA1C: No results for input(s): "HGBA1C" in the last 72 hours. CBG: No results for input(s): "GLUCAP" in the last 168 hours. Lipid Profile: No results for input(s): "CHOL", "HDL", "LDLCALC", "TRIG", "CHOLHDL", "LDLDIRECT" in the last 72 hours. Thyroid Function Tests: No results for input(s): "TSH", "T4TOTAL", "FREET4", "T3FREE", "THYROIDAB" in the last 72 hours. Anemia Panel: Recent Labs    07/04/22 1812  VITAMINB12 1,112*  FOLATE 33.5   Sepsis Labs: No results for input(s): "PROCALCITON", "LATICACIDVEN" in the last 168 hours.  Recent Results (from the past 240 hour(s))  SARS Coronavirus 2 by RT PCR (hospital order, performed in [facility] hospital lab) *cepheid single result test* Anterior Nasal Swab     Status: None   Collection Time: 07/04/22  2:50 PM   Specimen: Anterior Nasal Swab  Result Value Ref Range Status   SARS Coronavirus 2 by RT PCR NEGATIVE NEGATIVE Final    Comment: (NOTE) SARS-CoV-2 target nucleic acids are NOT DETECTED.  The SARS-CoV-2 RNA is generally detectable in upper and lower respiratory specimens during the acute phase of infection. The lowest concentration of SARS-CoV-2 viral copies this assay can detect is 250 copies / mL. A negative result does not preclude SARS-CoV-2 infection and should not be used as the sole basis for treatment or other patient management decisions.  A negative result may  occur with improper specimen collection / handling, submission of specimen other than nasopharyngeal swab, presence of viral mutation(s) within the areas targeted by this assay, and inadequate number of viral copies (<250 copies / mL). A negative result must be combined with clinical observations, patient history, and epidemiological information.  Fact Sheet for Patients:   https://www.patel.info/  Fact Sheet for Healthcare Providers: https://hall.com/  This test is not yet approved or  cleared by the [place] FDA and has been authorized for detection and/or diagnosis of SARS-CoV-2 by FDA under an Emergency Use Authorization (EUA).  This EUA will remain in effect (meaning this test can be used) for the duration of the COVID-19 declaration under Section 564(b)(1) of the Act, 21 U.S.C. section 360bbb-3(b)(1), unless the authorization is terminated or revoked sooner.  Performed at [facility], [place] [address] [place],  [address]          Radiology Studies: DG ESOPHAGUS W SINGLE CM (SOL OR THIN BA)  Result Date: 07/05/2022 CLINICAL DATA:  Dysphagia EXAM: ESOPHOGRAM/BARIUM SWALLOW TECHNIQUE: Single contrast examination was  performed using  thin barium. FLUOROSCOPY: Radiation Exposure Index (as provided by the fluoroscopic device): 3.6 mGy Kerma COMPARISON:  None Available. FINDINGS: Limited upright LPO single contrast esophagram. There is tortuosity of the upper thoracic esophagus. There is severe esophageal dysmotility. There is pooling of contrast in the proximal esophagus with significant delayed passage into the distal esophagus and stomach. No aspiration occurred during the exam. A 13 mm barium tablet was not given. Postoperative changes of the right lower neck and upper right ribs noted. IMPRESSION: Severe esophageal dysmotility. Electronically Signed   By: Maurine Simmering M.D.   On: 07/05/2022 15:34   DG CHEST  PORT 1 VIEW  Result Date: 07/05/2022 CLINICAL DATA:  Provided history: Status post thoracentesis. EXAM: PORTABLE CHEST 1 VIEW COMPARISON:  Prior chest radiographs 07/04/2022 and earlier. Chest CT 07/04/2022. FINDINGS: Left chest dual lead pacer device. Cardiomegaly, unchanged. Aortic atherosclerosis. Persistent small left pleural effusion, decreased in size from the prior examination of 07/04/2022. Associated left basilar atelectasis and/or consolidation. Focus of chronic consolidation within the right middle lobe, more fully characterized on yesterday's chest CT. No evidence of pneumothorax. No acute bony abnormality identified. IMPRESSION: No evidence of pneumothorax status post thoracentesis. Small left pleural effusion, decreased in size. Associated left basilar atelectasis and/or consolidation. Focus of chronic consolidation within the right middle lobe, more fully characterized on yesterday's chest CT. Please refer to this prior examination for further description. Cardiomegaly. Aortic Atherosclerosis (ICD10-I70.0). Electronically Signed   By: Kellie Simmering D.O.   On: 07/05/2022 11:21   ECHOCARDIOGRAM COMPLETE  Result Date: 07/05/2022    ECHOCARDIOGRAM REPORT   Patient Name:   Robert [name]. Date of Exam: 07/05/2022 Medical Rec #:  [id number]           Height:       72.0 in Accession #:    [id number]          Weight:       163.4 lb Date of Birth:  12-20-1928           BSA:          1.955 m Patient Age:    15 years            BP:           120/70 mmHg Patient Gender: M                   HR:           66 bpm. Exam Location:  Inpatient Procedure: 2D Echo                                 MODIFIED REPORT: This report was modified by Rudean Haskell MD on 07/05/2022 due to comment                           on study quality/difficulty.  Indications:     CHF  History:         Patient has no prior history of Echocardiogram examinations.                  CHF, COPD, Arrythmias:Atrial Fibrillation; Risk                   Factors:Dyslipidemia.  Sonographer:     Harvie Junior Referring Phys:  [id number] Jonnie Finner Diagnosing Phys: Rudean Haskell MD  Sonographer Comments:  Technically difficult study due to poor echo windows. Image acquisition challenging due to respiratory motion and Image acquisition challenging due to COPD. IMPRESSIONS  1. Left ventricular ejection fraction, by estimation, is 50 to 55%. The left ventricle has low normal function. The left ventricle has no regional wall motion abnormalities. Left ventricular diastolic parameters are indeterminate.  2. Right ventricular systolic function is normal. The right ventricular size is normal. A device lead not well visualized in this study is visualized. There is normal pulmonary artery systolic pressure.  3. Left atrial size was mildly dilated.  4. The mitral valve is normal in structure. No evidence of mitral valve regurgitation. No evidence of mitral stenosis.  5. The aortic valve was not well visualized. Aortic valve regurgitation is not visualized. No aortic stenosis is present.  6. Technically difficult study. Comparison(s): No prior Echocardiogram. FINDINGS  Left Ventricle: Left ventricular ejection fraction, by estimation, is 50 to 55%. The left ventricle has low normal function. The left ventricle has no regional wall motion abnormalities. The left ventricular internal cavity size was normal in size. There is no left ventricular hypertrophy. Left ventricular diastolic parameters are indeterminate. Right Ventricle: The right ventricular size is normal. No increase in right ventricular wall thickness. Right ventricular systolic function is normal. There is normal pulmonary artery systolic pressure. The tricuspid regurgitant velocity is 2.39 m/s, and  with an assumed right atrial pressure of 3 mmHg, the estimated right ventricular systolic pressure is 46.2 mmHg. Left Atrium: Left atrial size was mildly dilated. Right Atrium: Right atrial size was  normal in size. Pericardium: There is no evidence of pericardial effusion. Mitral Valve: The mitral valve is normal in structure. No evidence of mitral valve regurgitation. No evidence of mitral valve stenosis. Tricuspid Valve: The tricuspid valve is normal in structure. Tricuspid valve regurgitation is not demonstrated. No evidence of tricuspid stenosis. Aortic Valve: The aortic valve was not well visualized. Aortic valve regurgitation is not visualized. No aortic stenosis is present. Aortic valve mean gradient measures 4.0 mmHg. Aortic valve peak gradient measures 6.9 mmHg. Aortic valve area, by VTI measures 2.96 cm. Pulmonic Valve: The pulmonic valve was not well visualized. Pulmonic valve regurgitation is not visualized. Aorta: The aortic root and ascending aorta are structurally normal, with no evidence of dilitation. IAS/Shunts: No atrial level shunt detected by color flow Doppler. Additional Comments: A device lead not well visualized in this study is visualized.  LEFT VENTRICLE PLAX 2D LVIDd:         4.10 cm     Diastology LVIDs:         2.80 cm     LV e' medial:    6.53 cm/s LV PW:         0.80 cm     LV E/e' medial:  15.8 LV IVS:        0.80 cm     LV e' lateral:   7.40 cm/s LVOT diam:     2.10 cm     LV E/e' lateral: 13.9 LV SV:         63 LV SV Index:   32 LVOT Area:     3.46 cm  LV Volumes (MOD) LV vol d, MOD A2C: 68.5 ml LV vol d, MOD A4C: 94.8 ml LV vol s, MOD A2C: 28.0 ml LV vol s, MOD A4C: 44.7 ml LV SV MOD A2C:     40.5 ml LV SV MOD A4C:     94.8 ml LV SV MOD BP:  45.3 ml RIGHT VENTRICLE RV Basal diam:  3.30 cm RV Mid diam:    2.70 cm TAPSE (M-mode): 2.4 cm LEFT ATRIUM             Index        RIGHT ATRIUM           Index Robert diam:        2.90 cm 1.48 cm/m   RA Area:     31.50 cm Robert Vol (A2C):   74.2 ml 37.95 ml/m  RA Volume:   119.00 ml 60.87 ml/m Robert Vol (A4C):   66.4 ml 33.96 ml/m Robert Biplane Vol: 71.6 ml 36.62 ml/m  AORTIC VALVE                    PULMONIC VALVE AV Area (Vmax):    2.16  cm     PV Vmax:       0.87 m/s AV Area (Vmean):   2.41 cm     PV Peak grad:  3.0 mmHg AV Area (VTI):     2.96 cm AV Vmax:           131.00 cm/s AV Vmean:          89.400 cm/s AV VTI:            0.214 m AV Peak Grad:      6.9 mmHg AV Mean Grad:      4.0 mmHg LVOT Vmax:         81.60 cm/s LVOT Vmean:        62.200 cm/s LVOT VTI:          0.183 m LVOT/AV VTI ratio: 0.86  AORTA Ao Root diam: 3.40 cm Ao Asc diam:  2.70 cm MITRAL VALVE                TRICUSPID VALVE MV Area (PHT): 4.60 cm     TR Peak grad:   22.8 mmHg MV Decel Time: 165 msec     TR Vmax:        239.00 cm/s MR Peak grad: 28.1 mmHg MR Vmax:      265.00 cm/s   SHUNTS MV E velocity: 103.00 cm/s  Systemic VTI:  0.18 m MV A velocity: 38.10 cm/s   Systemic Diam: 2.10 cm MV E/A ratio:  2.70 Rudean Haskell MD Electronically signed by Rudean Haskell MD Signature Date/Time: 07/05/2022/11:10:30 AM    Final (Updated)    US RENAL  Result Date: 07/04/2022 CLINICAL DATA:  13 year old with acute kidney injury. EXAM: RENAL / URINARY TRACT ULTRASOUND COMPLETE COMPARISON:  None Available. FINDINGS: Right Kidney: Renal measurements: 10.6 x 4.2 x 4.7 cm = volume: 107 mL. Normal parenchymal echogenicity. No hydronephrosis. No visualized stone or focal lesion. Left Kidney: Renal measurements: 10.3 x 4.9 x 5 cm = volume: 132 mL. Normal parenchymal echogenicity. No hydronephrosis. No visualized stone or focal lesion. Bladder: Appears normal for degree of bladder distention. Other: None. IMPRESSION: No obstructive uropathy. No acute findings or explanation for acute kidney injury. Electronically Signed   By: Keith Rake M.D.   On: 07/04/2022 21:10   CT Chest Wo Contrast  Result Date: 07/04/2022 CLINICAL DATA:  Pneumonia, complication suspected, xray done EXAM: CT CHEST WITHOUT CONTRAST TECHNIQUE: Multidetector CT imaging of the chest was performed following the standard protocol without IV contrast. RADIATION DOSE REDUCTION: This exam was performed  according to the departmental dose-optimization program which includes automated exposure control, adjustment of the mA and/or kV according  to patient size and/or use of iterative reconstruction technique. COMPARISON:  Radiograph earlier today. PET CT 10/25/2021, chest CT 09/10/2021 FINDINGS: Cardiovascular: Left-sided pacemaker in place with lead tips projecting over the right atrium and ventricle. Multi chamber cardiomegaly. Prominent main pulmonary artery at 3.3 cm. Small amount of fluid in the superior pericardial recess without significant pericardial effusion. Aortic atherosclerosis and tortuosity. No periaortic stranding. Mediastinum/Nodes: 14 mm subcarinal node series 2, image 66, previously 20 mm. 15 mm low right infrahilar node, series 2, image 84, unchanged. Additional scattered small mediastinal lymph nodes that are not enlarged by size criteria in similar to prior exam. Assessment for hilar adenopathy is significantly limited in the absence of IV contrast. Patulous esophagus with retained debris. Lungs/Pleura: Moderate size left pleural effusion has increased in size from prior imaging. Small amount of fluid tracks in the inter lobar fissure. Near complete consolidation of the left lower lobe. Postsurgical volume loss in the right hemithorax. Chronic area of consolidation in the right middle lobe with air bronchograms, unchanged in appearance from prior imaging. Areas of ground-glass opacity in septal thickening in the right lower lobe, also chronic and similar in appearance to prior exam. Upper Abdomen: No acute upper abdominal findings. Musculoskeletal: Chronic C7 compression fracture. Chronic T7 compression fracture. Postsurgical change of the right upper hemithorax. Thoracic spondylosis with spurring. Pectus excavatum deformity. No focal bone lesions. The bones are diffusely under mineralized. IMPRESSION: 1. Moderate size left pleural effusion has increased in size from prior imaging. Near complete  consolidation of the left lower lobe, favor pneumonia over compressive atelectasis. 2. Chronic area of consolidation in the right middle lobe with air bronchograms, unchanged in appearance from prior imaging. Scarring is favored. 3. Areas of ground-glass opacity and septal thickening in the right lower lobe are also chronic and similar in appearance to prior imaging. 4. Cardiomegaly. Prominent main pulmonary artery suggesting pulmonary arterial hypertension. 5. Patulous esophagus with retained debris. Aortic Atherosclerosis (ICD10-I70.0). Electronically Signed   By: Keith Rake M.D.   On: 07/04/2022 15:36        Scheduled Meds:  docusate sodium  100 mg Oral BID   fluticasone furoate-vilanterol  1 puff Inhalation Daily   And   umeclidinium bromide  1 puff Inhalation Daily   furosemide  40 mg Intravenous Daily   guaiFENesin  1,200 mg Oral BID   levothyroxine  125 mcg Oral Q0600   mouth rinse  15 mL Mouth Rinse 4 times per day   Rivaroxaban  15 mg Oral Q supper   sodium chloride HYPERTONIC  4 mL Nebulization BID   Continuous Infusions:  ceFEPime (MAXIPIME) IV 2 g (07/05/22 1752)   vancomycin 1,500 mg (07/04/22 1904)     LOS: 2 days    Time spent: 35 minutes    Barb Merino, MD [facility] Pager [phone]

## 2022-07-06 NOTE — Progress Notes (Signed)
   NAME:  Robert Grosser., MRN:  [id number], DOB:  1928/10/08, LOS: 2 ADMISSION DATE:  07/04/2022, CONSULTATION DATE:  10/30 REFERRING MD:  Marylyn Ishihara, CHIEF COMPLAINT:  Dyspnea   History of Present Illness:  88 year old male who I followed in the past for bronchiectasis and a distant history of lung cancer admitted on 10/31 in the setting of acute hypoxemic respiratory failure and a new left pleural effusion causing compressive atelectasis of the left lung.   Pertinent  Medical History  Right upper lobe 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removed in 1990, treated with radiation therapy Bronchiectasis Chronic aspiration Chronic back pain Hypothyroidism Atrial fibrillation, on Xarelto  Significant Hospital Events: Including procedures, antibiotic start and stop dates in addition to other pertinent events   October 30 CT chest images independently reviewed showing bronchiectasis and surgical changes in the right lung, moderate size pleural effusion with compressive atelectasis versus consolidation in the left lower lobe 10/31 thora L 725 cloudy fluid  Interim History / Subjective:  No events, breathing improved after thoracentesis.  Objective   Blood pressure 127/67, pulse 69, temperature 98.3 F (36.8 C), temperature source Oral, resp. rate 20, height 6' (1.829 m), weight 71.2 kg, SpO2 98 %.        Intake/Output Summary (Last 24 hours) at 07/06/2022 0710 Last data filed at 07/06/2022 0500 Gross per 24 hour  Intake 696 ml  Output 2470 ml  Net -1774 ml    Filed Weights   07/04/22 1252 07/05/22 0500 07/06/22 0500  Weight: 76.2 kg 74.1 kg 71.2 kg    Examination: Elderly man in NAD Reduced breath sounds L base, some rhonci on R Ext warm, no edema Moves all 4 ext to command  Abd soft Age related skin changes    Resolved Hospital Problem list    Assessment & Plan:  Bronchiectasis with acute exacerbation Community-acquired pneumonia, at risk for resistant organisms Acute respiratory failure  with hypoxemia Compressive atelectasis of left lower lobe Moderate size pleural effusion on left Atrial fibrillation on Xarelto Dysphagia, recurrent aspiration  Discussion: See prior discussion notes.  I'm worried the left effusion could be a complicated parapneumonic effusion.  Plan: -Continue vanc/cefepime, f/u culture data -Pulmonary toileting as ordered -Will check bedside US: may need pigtail/lytics -F/u pleural fluid studies, sputum culture- cell count/LDH etc not sent only cytology, will ask Rns to reach out to lab to see if we can add these on  We will follow  Best Practice (right click and "Reselect all SmartList Selections" daily)   Per [facility] See code status discussion note from 10/30   Erskine Emery MD PCCM See amion for pager info or use securechat during day After 7:00 pm call Elink  [phone]

## 2022-07-07 ENCOUNTER — Inpatient Hospital Stay (HOSPITAL_COMMUNITY): Payer: Medicare Other

## 2022-07-07 ENCOUNTER — Telehealth: Payer: Self-pay | Admitting: Internal Medicine

## 2022-07-07 DIAGNOSIS — J9601 Acute respiratory failure with hypoxia: Secondary | ICD-10-CM | POA: Diagnosis not present

## 2022-07-07 DIAGNOSIS — E43 Unspecified severe protein-calorie malnutrition: Secondary | ICD-10-CM | POA: Insufficient documentation

## 2022-07-07 LAB — CBC WITH DIFFERENTIAL/PLATELET
Abs Immature Granulocytes: 0.08 10*3/uL — ABNORMAL HIGH (ref 0.00–0.07)
Basophils Absolute: 0 10*3/uL (ref 0.0–0.1)
Basophils Relative: 0 %
Eosinophils Absolute: 0 10*3/uL (ref 0.0–0.5)
Eosinophils Relative: 0 %
HCT: 37.5 % — ABNORMAL LOW (ref 39.0–52.0)
Hemoglobin: 12 g/dL — ABNORMAL LOW (ref 13.0–17.0)
Immature Granulocytes: 1 %
Lymphocytes Relative: 9 %
Lymphs Abs: 0.9 10*3/uL (ref 0.7–4.0)
MCH: 33 pg (ref 26.0–34.0)
MCHC: 32 g/dL (ref 30.0–36.0)
MCV: 103 fL — ABNORMAL HIGH (ref 80.0–100.0)
Monocytes Absolute: 1.6 10*3/uL — ABNORMAL HIGH (ref 0.1–1.0)
Monocytes Relative: 15 %
Neutro Abs: 7.7 10*3/uL (ref 1.7–7.7)
Neutrophils Relative %: 75 %
Platelets: 219 10*3/uL (ref 150–400)
RBC: 3.64 MIL/uL — ABNORMAL LOW (ref 4.22–5.81)
RDW: 17.2 % — ABNORMAL HIGH (ref 11.5–15.5)
WBC: 10.3 10*3/uL (ref 4.0–10.5)
nRBC: 0 % (ref 0.0–0.2)

## 2022-07-07 LAB — BASIC METABOLIC PANEL
Anion gap: 9 (ref 5–15)
BUN: 48 mg/dL — ABNORMAL HIGH (ref 8–23)
CO2: 29 mmol/L (ref 22–32)
Calcium: 9.2 mg/dL (ref 8.9–10.3)
Chloride: 103 mmol/L (ref 98–111)
Creatinine, Ser: 1.59 mg/dL — ABNORMAL HIGH (ref 0.61–1.24)
GFR, Estimated: 41 mL/min — ABNORMAL LOW (ref >60)
Glucose, Bld: 139 mg/dL — ABNORMAL HIGH (ref 70–99)
Potassium: 3.8 mmol/L (ref 3.5–5.1)
Sodium: 141 mmol/L (ref 135–145)

## 2022-07-07 LAB — MAGNESIUM: Magnesium: 2.4 mg/dL (ref 1.7–2.4)

## 2022-07-07 LAB — LEGIONELLA PNEUMOPHILA SEROGP 1 UR AG: L. pneumophila Serogp 1 Ur Ag: NEGATIVE

## 2022-07-07 LAB — PHOSPHORUS: Phosphorus: 3.5 mg/dL (ref 2.5–4.6)

## 2022-07-07 NOTE — Progress Notes (Signed)
SATURATION QUALIFICATIONS: (This note is used to comply with regulatory documentation for home oxygen)  Patient Saturations on Room Air at Rest = 92%  Patient Saturations on Room Air while Ambulating = 85%( 6')  Patient Saturations on 2 Liters of oxygen while Ambulating = 94%(10'  tolerated only)  Please briefly explain why patient needs home oxygen:Patient requires supplemental oxygen for maintaining saturations during mobility/ambulation. [place] Office [phone] Weekend [phone]

## 2022-07-07 NOTE — Evaluation (Signed)
Physical Therapy Evaluation Patient Details Name: Robert [name]. MRN: [id number] DOB: 1929/03/14 Today's Date: 07/07/2022  History of Present Illness  88 year old male who I followed in the past for bronchiectasis and a distant history of lung cancer admitted on 10/31 in the setting of acute hypoxemic respiratory failure and a new left pleural effusion causing compressive atelectasis of the left lung. Chest tube placedthen DC'd 11/2.  Clinical Impression  The patient   admitted for above problems.  Patient resting in bed, wife present.  Patient tolerated only 6' on RA  rested then 10' on 2 L. See saturation note.  Discussed with  patient and wife, that walking from car into home with RW and O2 tank may require that they get extra support.  Patient  tolerates very short distance ambulation.  Offered to order a WC, declined at this time.  Patient will benefit from HHPT.   Pt admitted with above diagnosis.  Pt currently with functional limitations due to the deficits listed below (see PT Problem List). Pt will benefit from skilled PT to increase their independence and safety with mobility to allow discharge to the venue listed below.        Recommendations for follow up therapy are one component of a multi-disciplinary discharge planning process, led by the attending physician.  Recommendations may be updated based on patient status, additional functional criteria and insurance authorization.  Follow Up Recommendations Home health PT      Assistance Recommended at Discharge Frequent or constant Supervision/Assistance  Patient can return home with the following  A lot of help with walking and/or transfers;A little help with bathing/dressing/bathroom;Help with stairs or ramp for entrance;Assistance with cooking/housework;Assist for transportation    Equipment Recommendations    Recommendations for Other Services       Functional Status Assessment Patient has had a recent decline in  their functional status and demonstrates the ability to make significant improvements in function in a reasonable and predictable amount of time.     Precautions / Restrictions Precautions Precautions: Fall Precaution Comments: monitor o2, tolerates only short distance walk Restrictions Weight Bearing Restrictions: No      Mobility  Bed Mobility               General bed mobility comments: in recliner    Transfers Overall transfer level: Needs assistance Equipment used: Rolling walker (2 wheels) Transfers: Sit to/from Stand Sit to Stand: Min guard           General transfer comment: use of rails    Ambulation/Gait Ambulation/Gait assistance: Min assist, +2 safety/equipment Gait Distance (Feet): 6 Feet (then 10') Assistive device: Rolling walker (2 wheels) Gait Pattern/deviations: Step-to pattern, Step-through pattern, Trunk flexed Gait velocity: decr     General Gait Details: patient stood for ~ 1 min then amb x 6' on RA, seated rest break and then 10' on 2 LPM  Stairs            Wheelchair Mobility    Modified Rankin (Stroke Patients Only)       Balance Overall balance assessment: Mild deficits observed, not formally tested                                           Pertinent Vitals/Pain Pain Assessment Faces Pain Scale: Hurts a little bit Pain Location: back and residual  CT site Pain Descriptors / Indicators:  Aching, Grimacing Pain Intervention(s): Monitored during session, Premedicated before session    [place] expects to be discharged to:: Private residence Living Arrangements: Spouse/significant other Available Help at Discharge: Family;Available 24 hours/day Type of Home: House Home Access: Level entry       Home Layout: One level Home Equipment: Rollator (4 wheels);Cane - single point;Shower seat      Prior Function Prior Level of Function : Independent/Modified Independent              Mobility Comments: used a cane predominantly, PRN rollator use ADLs Comments: independent     Hand Dominance   Dominant Hand: Right    Extremity/Trunk Assessment   Upper Extremity Assessment Upper Extremity Assessment: Generalized weakness    Lower Extremity Assessment Lower Extremity Assessment: Generalized weakness    Cervical / Trunk Assessment Cervical / Trunk Assessment: Kyphotic;Neck Surgery  Communication   Communication: No difficulties  Cognition Arousal/Alertness: Awake/alert Behavior During Therapy: WFL for tasks assessed/performed Overall Cognitive Status: Within Functional Limits for tasks assessed                                          General Comments General comments (skin integrity, edema, etc.): O2 sat dropped to 85% on RA. needed o2 to recover to above 90%.    Exercises Other Exercises Other Exercises: Pursed lip breaths   Assessment/Plan    PT Assessment Patient needs continued PT services  PT Problem List Decreased strength;Decreased mobility;Decreased knowledge of precautions;Decreased activity tolerance;Cardiopulmonary status limiting activity;Decreased knowledge of use of DME;Decreased balance;Pain       PT Treatment Interventions DME instruction;Therapeutic activities;Gait training;Therapeutic exercise;Patient/family education;Functional mobility training    PT Goals (Current goals can be found in the Care Plan section)  Acute Rehab PT Goals Patient Stated Goal: go home PT Goal Formulation: With patient/family Time For Goal Achievement: 07/21/22 Potential to Achieve Goals: Good    Frequency Min 3X/week     Co-evaluation               AM-PAC PT "6 Clicks" Mobility  Outcome Measure Help needed turning from your back to your side while in a flat bed without using bedrails?: A Little Help needed moving from lying on your back to sitting on the side of a flat bed without using bedrails?: A Little Help  needed moving to and from a bed to a chair (including a wheelchair)?: A Little Help needed standing up from a chair using your arms (e.g., wheelchair or bedside chair)?: A Little Help needed to walk in hospital room?: A Little Help needed climbing 3-5 steps with a railing? : A Lot 6 Click Score: 17    End of Session Equipment Utilized During Treatment: Gait belt;Oxygen Activity Tolerance: Treatment limited secondary to medical complications (Comment) Patient left: in chair;with call bell/phone within reach;with chair alarm set;with family/visitor present Nurse Communication: Mobility status PT Visit Diagnosis: Unsteadiness on feet (R26.81);Muscle weakness (generalized) (M62.81)    Time: 7846-9629 PT Time Calculation (min) (ACUTE ONLY): 18 min   Charges:   PT Evaluation $PT Eval Low Complexity: 1 Low          [place] Office [phone] Weekend [phone]   Claretha Cooper 07/07/2022, 3:32 PM

## 2022-07-07 NOTE — Evaluation (Addendum)
Occupational Therapy Evaluation Patient Details Name: Robert [name]. MRN: [id number] DOB: 01/28/1929 Today's Date: 07/07/2022   History of Present Illness 88 year old male who I followed in the past for bronchiectasis and a distant history of lung cancer admitted on 10/31 in the setting of acute hypoxemic respiratory failure and a new left pleural effusion causing compressive atelectasis of the left lung. Chest tube placed   Clinical Impression   Robert [name] is a 88 year old man who presents with above medical history. On evaluation he presents supine in bed on room air after chest tube removal with generalized weakness and poor activity tolerance. He was supervision to transfer to side of bed. He exhibited poor cervical and thoracic posture with difficulty lifting his head and his arms at edge of bed. His oxygen wavered between 89-91% at edge of bed and dropped to 88% when he donned his socks. He was min assist to stand and min guard to transfer to recliner. He reported immediate fatigue with limited activity and his o2 at dropped to 85%. He required 2 L to recover back into the 90s. Once at rest he stabilized at 94-95% on room air. He requires increased assistance for mobility and ADLs.Patient will benefit from skilled OT services while in hospital to improve deficits and learn compensatory strategies as needed in order to return to PLOF.  Therapist recommend short term rehab but patient declined stating he wanted to go home. Recommend 24/7 assist of wife, home health services, a BSC and potentially a wheelchair pending his ambulation abilities with PT.      Recommendations for follow up therapy are one component of a multi-disciplinary discharge planning process, led by the attending physician.  Recommendations may be updated based on patient status, additional functional criteria and insurance authorization.   Follow Up Recommendations  Home health OT    Assistance Recommended at  Discharge Frequent or constant Supervision/Assistance  Patient can return home with the following A little help with walking and/or transfers;A lot of help with bathing/dressing/bathroom;Assistance with cooking/housework;Assist for transportation;Help with stairs or ramp for entrance    Functional Status Assessment  Patient has had a recent decline in their functional status and demonstrates the ability to make significant improvements in function in a reasonable and predictable amount of time.  Equipment Recommendations  BSC/3in1    Recommendations for Other Services       Precautions / Restrictions Precautions Precautions: Fall Precaution Comments: monitor o2 Restrictions Weight Bearing Restrictions: No      Mobility Bed Mobility Overal bed mobility: Needs Assistance Bed Mobility: Supine to Sit     Supine to sit: Supervision, HOB elevated          Transfers Overall transfer level: Needs assistance Equipment used: Rolling walker (2 wheels) Transfers: Sit to/from Stand, Bed to chair/wheelchair/BSC Sit to Stand: Min assist     Step pivot transfers: Min guard     General transfer comment: Just to transfer to recliner.      Balance Overall balance assessment: Mild deficits observed, not formally tested                                         ADL either performed or assessed with clinical judgement   ADL Overall ADL's : Needs assistance/impaired Eating/Feeding: Set up   Grooming: Set up;Sitting   Upper Body Bathing: Minimal assistance;Sitting   Lower Body Bathing:  Sit to/from stand;Minimal assistance   Upper Body Dressing : Moderate assistance;Sitting Upper Body Dressing Details (indicate cue type and reason): Patient wouldn't raise arms high enough to get shirt on himself Lower Body Dressing: Moderate assistance;Sit to/from stand Lower Body Dressing Details (indicate cue type and reason): Patient able to don socks at edge of bed but  desatted - does not exhibit enough activity tolerance to perforom all of LB dressnig Toilet Transfer: Minimal assistance;BSC/3in1;Rolling walker (2 wheels);Stand-pivot   Toileting- Water quality scientist and Hygiene: Moderate assistance;Sit to/from stand       Functional mobility during ADLs: Minimal assistance;Rolling walker (2 wheels)       Vision Patient Visual Report: No change from baseline       Perception     Praxis      Pertinent Vitals/Pain Pain Assessment Pain Assessment: Faces Faces Pain Scale: Hurts a little bit Pain Location: residual chest tube site Pain Descriptors / Indicators: Aching, Grimacing Pain Intervention(s): Monitored during session     Hand Dominance Right   Extremity/Trunk Assessment Upper Extremity Assessment Upper Extremity Assessment: Generalized weakness (exhibits poor shoulder ROM - patient wouldn't raise arms)   Lower Extremity Assessment Lower Extremity Assessment: Defer to PT evaluation   Cervical / Trunk Assessment Cervical / Trunk Assessment: Kyphotic;Neck Surgery   Communication Communication Communication: No difficulties   Cognition Arousal/Alertness: Awake/alert Behavior During Therapy: WFL for tasks assessed/performed Overall Cognitive Status: Within Functional Limits for tasks assessed                                       General Comments  O2 sat dropped to 85% on RA. needed o2 to recover to above 90%.    Exercises     Shoulder Instructions      Home Living Family/patient expects to be discharged to:: Private residence Living Arrangements: Spouse/significant other Available Help at Discharge: Family;Available 24 hours/day Type of Home: House Home Access: Level entry     Home Layout: One level     Bathroom Shower/Tub: Occupational psychologist: Handicapped height     Home Equipment: Rollator (4 wheels);Cane - single point;Shower seat          Prior Functioning/Environment  Prior Level of Function : Independent/Modified Independent             Mobility Comments: used a cane predominantly, PRN rollator use ADLs Comments: independent        OT Problem List: Decreased strength;Decreased range of motion;Decreased activity tolerance;Impaired balance (sitting and/or standing);Decreased knowledge of use of DME or AE;Cardiopulmonary status limiting activity      OT Treatment/Interventions: Self-care/ADL training;Therapeutic exercise;Energy conservation;DME and/or AE instruction;Therapeutic activities;Patient/family education;Balance training    OT Goals(Current goals can be found in the care plan section) Acute Rehab OT Goals Patient Stated Goal: to go home OT Goal Formulation: With patient Time For Goal Achievement: 07/21/22 Potential to Achieve Goals: Fair ADL Goals Pt Will Perform Lower Body Dressing: with supervision;sit to/from stand Pt Will Transfer to Toilet: with supervision;ambulating;regular height toilet;grab bars;bedside commode Pt Will Perform Toileting - Clothing Manipulation and hygiene: with supervision;sit to/from stand Additional ADL Goal #1: Patient will perform 10 min functional activity or exercise activity as evidence of improving activity tolerance  OT Frequency: Min 2X/week    Co-evaluation              AM-PAC OT "6 Clicks" Daily Activity  Outcome Measure Help from another person eating meals?: A Little Help from another person taking care of personal grooming?: A Little Help from another person toileting, which includes using toliet, bedpan, or urinal?: A Lot Help from another person bathing (including washing, rinsing, drying)?: A Lot Help from another person to put on and taking off regular upper body clothing?: A Lot Help from another person to put on and taking off regular lower body clothing?: A Lot 6 Click Score: 14   End of Session Equipment Utilized During Treatment: Rolling walker (2 wheels) Nurse  Communication: Mobility status (o2 s ats)  Activity Tolerance: Patient limited by fatigue Patient left: in chair;with call bell/phone within reach;with family/visitor present  OT Visit Diagnosis: Muscle weakness (generalized) (M62.81)                Time: 0076-2263 OT Time Calculation (min): 19 min Charges:  OT General Charges $OT Visit: 1 Visit OT Evaluation $OT Eval Low Complexity: 1 Low  Gustavo Lah, OTR/L [place]  Office [phone]   Lenward Chancellor 07/07/2022, 1:50 PM

## 2022-07-07 NOTE — Progress Notes (Signed)
07/07/2022  I have seen and evaluated the patient for parapneumonic effusion.  S:  No events, breathing improved after further pleural drainage.   O: Blood pressure 104/69, pulse 70, temperature 97.8 F (36.6 C), temperature source Oral, resp. rate (!) 28, height 6' (1.829 m), weight 71.2 kg, SpO2 99 %.  No distress Chest tube with tidaling, no air leak with cough, serosanguinous output Otherwise Aox3, good strength  CXR with improved aeration, minimal remaining fluid.  A:  Parapneumonic effusion Bronchiectasis  Cultures all neg to date  P:  Suggested that he stay one more day to assure there isn't high output with chest tube.  He is adamant on going home. Told him if recurs we may need to do additional procedures and potentially keep him longer next time should he require admission.  Will arrange close f/u with Dr. Lake Bells with CXR  Augmentin x 7 days should be adequate since we have no culture data, MRSA PCR neg  Discussed with primary  Erskine Emery MD [facility] Prefer epic messenger for cross cover needs If after hours, please call E-link

## 2022-07-07 NOTE — Progress Notes (Signed)
Initial Nutrition Assessment  DOCUMENTATION CODES:   Severe malnutrition in context of chronic illness  INTERVENTION:  -Continue regular diet as tolerated -Encourage small frequent meals, snacks and supplements -Continue home vitamin regimen: MVI, iron, vitamin c and vitamin d -Drink 2 Ensure Plus HP/day (350kcal, and 20g protein/bottle) -Choose high calorie and high protein foods to optimize po intake. Ex: choose 2% or whole milk instead of skim.  NUTRITION DIAGNOSIS:  Severe Malnutrition related to chronic illness, dysphagia as evidenced by severe fat depletion, severe muscle depletion, percent weight loss.  GOAL:  Patient will meet greater than or equal to 90% of their needs  MONITOR:  PO intake, Supplement acceptance  REASON FOR ASSESSMENT:  Consult Assessment of nutrition requirement/status  ASSESSMENT:  Pt is a 88yo M with PMH of hypothyroidism, COPD, paroxysmal afib, complete heart block status post pacemaker, CKD 3b who presents with SOB. Pt had chest tube placed 11/1, and 800 mL fluid drained.  Visited pt at wife at bedside this afternoon. Wife reports pt has been having trouble swallowing for >1 year. He has frequent lung infections and is on antibiotics often. Wife reports pt has had significant weight loss and none of his pants fit. Chart review shows a significant 11.3% unintended weight loss in the last 3 months. NFPE reveals severe muscle wasting and severe fat loss. Pt meets ASPEN criteria for severe protein calorie malnutrition.  Wife questioned why pt is losing so much weight. Discussed difficulty breathing, difficulty swallowing and frequent infections as issues that increase calorie needs. Pt currently using protein shake at home. Discussed trying Ensure Plus HP or Ensure Complete for high calorie option. Coupons provided to help reduce cost barrier. Discussed usual diet and ways to increase calories and protein by adding full fat dairy, butter, or peanut butter for  example. Discouraged low calorie products when possible. Pt and wife asked appropriate questions and demonstrate understanding.  Medications reviewed and include: colace, lasix, synthroid, xarelto, prn norco  Labs reviewed: BUN:48, Cr:1.59, GFR:41   NUTRITION - FOCUSED PHYSICAL EXAM:  Flowsheet Row Most Recent Value  Orbital Region Severe depletion  Upper Arm Region Severe depletion  Thoracic and Lumbar Region Severe depletion  Buccal Region Severe depletion  Temple Region Severe depletion  Clavicle Bone Region Severe depletion  Clavicle and Acromion Bone Region Severe depletion  Scapular Bone Region Unable to assess  Dorsal Hand Moderate depletion  Patellar Region Severe depletion  Anterior Thigh Region Severe depletion  Posterior Calf Region Moderate depletion  Edema (RD Assessment) Mild  Hair Reviewed  Eyes Reviewed  Mouth Reviewed  Skin Reviewed  Nails Reviewed       Diet Order:   Diet Order             Diet regular Room service appropriate? Yes; Fluid consistency: Thin  Diet effective now                   EDUCATION NEEDS:  Education needs have been addressed  Skin:  Skin Assessment: Reviewed RN Assessment  Last BM:  10/30  Height:  Ht Readings from Last 1 Encounters:  07/04/22 6' (1.829 m)   Weight:  Wt Readings from Last 1 Encounters:  07/07/22 71.2 kg   BMI:  Body mass index is 21.29 kg/m.  Estimated Nutritional Needs:  Kcal:  2130-2500kcal Protein:  85-110g Fluid:  >2121mL  Candise Bowens, MS, RD, LDN, CNSC See AMiON for contact information

## 2022-07-07 NOTE — Progress Notes (Signed)
SATURATION QUALIFICATIONS: (This note is used to comply with regulatory documentation for home oxygen)  Patient Saturations on Room Air at Rest = 94%  Patient Saturations on Room Air while Ambulating = 85%..transfer to chair not ambulating  Patient Saturations on 2 Liters of oxygen while Ambulating =  Unable to tolerate ambulation  Please briefly explain why patient needs home oxygen: Patient will need home oxygen. Patient desats with exertion.

## 2022-07-07 NOTE — Progress Notes (Signed)
PROGRESS NOTE    Robert [name].  ZYS:[id number] DOB: 06/03/1929 DOA: 07/04/2022 PCP: [name], MD    Brief Narrative:  88 year old with hypothyroidism, COPD, paroxysmal atrial fibrillation on Xarelto, complete heart block status post pacemaker, CKD stage IIIb presented with SOB .  Does not wear oxygen at home.  He went to pulmonology office and found to be on room air and 82% and cyanotic. CT scan showed bronchiectasis, left sided consolidation and pleural effusion.  Patient was treated with antibiotics from clinic for more than 2 weeks.  He also has history of significant dysphagia.   Assessment & Plan:  Left lower lobe pneumonia with parapneumonic effusion, acute hypoxemic respiratory failure as evidenced by saturation 82% on arrival.  Failed outpatient therapies. Continue vancomycin and cefepime to treat complicated pneumonia.  Cultures negative so far.   Thoracentesis left chest , 758 removed, LDH more than thousand and cell count more than 8000. Chest tube placed 11/1, removed 11/2.  800 mL drained.   Repeat chest x-ray stable with some layering pleural effusion.   Continue aggressive chest physiotherapy, incentive, mucolytic's and bronchodilators.   Mobilize with PT OT today.   Significant dysphagia, barium esophagogram shows tortuous esophagus and probably contributing to chronic aspiration.  Aspiration precautions.  Unlikely patient has any surgical intervention that will be helpful.    Chronic medical issues including Hypothyroidism COPD Paroxysmal A-fib on Xarelto CKD stage IIIb Chronic diastolic heart failure Remain stable.  Patient was very eager to go home, however he could not get out of the bed to the chair we will continue inpatient treatment, increase mobility before discharging home.  He will need oxygen to go home.   DVT prophylaxis:    Rivaroxaban (XARELTO) tablet 15 mg   Code Status: Full code Family Communication: Wife at the  bedside. Disposition Plan: Status is: Inpatient Remains inpatient appropriate because: IV antibiotics.  Inpatient procedures.     Consultants:  PCCM  Procedures:  Thoracentesis  Antimicrobials:  Vancomycin cefepime 10/30----   Subjective:  Patient seen and examined.  In my morning rounds wife was at the bedside and she was eager to take him home.  Patient felt fairly comfortable after removing chest tube while resting in the bed.  He could not get out of the bed by himself, became dyspneic and saturations less than 84%.  Decided to continue to work with therapy and stay in the hospital.  Objective: Vitals:   07/07/22 0540 07/07/22 0838 07/07/22 0930 07/07/22 1254  BP: 104/69   115/76  Pulse: 70   84  Resp: 16 (!) 28 20 20   Temp: 97.8 F (36.6 C)   97.6 F (36.4 C)  TempSrc: Oral   Oral  SpO2: 97% 99% 98% 90%  Weight:      Height:        Intake/Output Summary (Last 24 hours) at 07/07/2022 1314 Last data filed at 07/07/2022 0320 Gross per 24 hour  Intake 612.04 ml  Output 1480 ml  Net -867.96 ml   Filed Weights   07/05/22 0500 07/06/22 0500 07/07/22 0500  Weight: 74.1 kg 71.2 kg 71.2 kg    Examination:  General exam: Appears calm and comfortable on my exam at the bedside. Respiratory system: Poor air entry on the left base.  Otherwise without any respiratory distress.  Chest tube was removed. Cardiovascular system: S1 & S2 heard, RRR.  Pacemaker in place.  Trace bilateral pedal edema.  Gastrointestinal system: Abdomen is nondistended, soft and nontender. No  organomegaly or masses felt. Normal bowel sounds heard. Central nervous system: Alert and oriented. No focal neurological deficits. Extremities: Symmetric 5 x 5 power. Skin: No rashes, lesions or ulcers Psychiatry: Judgement and insight appear normal. Mood & affect appropriate.     Data Reviewed: I have personally reviewed following labs and imaging studies  CBC: Recent Labs  Lab 07/04/22 1406  07/05/22 0519 07/07/22 0522  WBC 9.0 14.2* 10.3  NEUTROABS 7.9*  --  7.7  HGB 11.3* 11.2* 12.0*  HCT 35.0* 35.2* 37.5*  MCV 103.6* 104.5* 103.0*  PLT 178 181 494   Basic Metabolic Panel: Recent Labs  Lab 07/04/22 1406 07/05/22 0519 07/06/22 1219 07/07/22 0522  NA 137 140  --  141  K 3.8 3.9  --  3.8  CL 102 106  --  103  CO2 24 24  --  29  GLUCOSE 120* 139*  --  139*  BUN 37* 44*  --  48*  CREATININE 1.90* 1.92* 1.56* 1.59*  CALCIUM 9.0 9.0  --  9.2  MG  --   --   --  2.4  PHOS  --   --   --  3.5   GFR: Estimated Creatinine Clearance: 30.5 mL/min (A) (by C-G formula based on SCr of 1.59 mg/dL (H)). Liver Function Tests: Recent Labs  Lab 07/04/22 1406 07/05/22 0519  AST 27 34  ALT 17 19  ALKPHOS 63 60  BILITOT 1.5* 1.0  PROT 8.0 7.7  ALBUMIN 3.4* 3.2*   No results for input(s): "LIPASE", "AMYLASE" in the last 168 hours. No results for input(s): "AMMONIA" in the last 168 hours. Coagulation Profile: No results for input(s): "INR", "PROTIME" in the last 168 hours. Cardiac Enzymes: No results for input(s): "CKTOTAL", "CKMB", "CKMBINDEX", "TROPONINI" in the last 168 hours. BNP (last 3 results) No results for input(s): "PROBNP" in the last 8760 hours. HbA1C: No results for input(s): "HGBA1C" in the last 72 hours. CBG: No results for input(s): "GLUCAP" in the last 168 hours. Lipid Profile: No results for input(s): "CHOL", "HDL", "LDLCALC", "TRIG", "CHOLHDL", "LDLDIRECT" in the last 72 hours. Thyroid Function Tests: No results for input(s): "TSH", "T4TOTAL", "FREET4", "T3FREE", "THYROIDAB" in the last 72 hours. Anemia Panel: Recent Labs    07/04/22 1812  VITAMINB12 1,112*  FOLATE 33.5   Sepsis Labs: No results for input(s): "PROCALCITON", "LATICACIDVEN" in the last 168 hours.  Recent Results (from the past 240 hour(s))  SARS Coronavirus 2 by RT PCR (hospital order, performed in [facility] hospital lab) *cepheid single result test* Anterior Nasal Swab      Status: None   Collection Time: 07/04/22  2:50 PM   Specimen: Anterior Nasal Swab  Result Value Ref Range Status   SARS Coronavirus 2 by RT PCR NEGATIVE NEGATIVE Final    Comment: (NOTE) SARS-CoV-2 target nucleic acids are NOT DETECTED.  The SARS-CoV-2 RNA is generally detectable in upper and lower respiratory specimens during the acute phase of infection. The lowest concentration of SARS-CoV-2 viral copies this assay can detect is 250 copies / mL. A negative result does not preclude SARS-CoV-2 infection and should not be used as the sole basis for treatment or other patient management decisions.  A negative result may occur with improper specimen collection / handling, submission of specimen other than nasopharyngeal swab, presence of viral mutation(s) within the areas targeted by this assay, and inadequate number of viral copies (<250 copies / mL). A negative result must be combined with clinical observations, patient history, and epidemiological  information.  Fact Sheet for Patients:   https://www.patel.info/  Fact Sheet for Healthcare Providers: https://hall.com/  This test is not yet approved or  cleared by the [place] FDA and has been authorized for detection and/or diagnosis of SARS-CoV-2 by FDA under an Emergency Use Authorization (EUA).  This EUA will remain in effect (meaning this test can be used) for the duration of the COVID-19 declaration under Section 564(b)(1) of the Act, 21 U.S.C. section 360bbb-3(b)(1), unless the authorization is terminated or revoked sooner.  Performed at [facility], [place] [address], [place], [place] [address]   Body fluid culture w Gram Stain     Status: None (Preliminary result)   Collection Time: 07/05/22 10:48 AM   Specimen: Pleural Fluid  Result Value Ref Range Status   Specimen Description   Final    PLEURAL LT Performed at [place]  [address] [place], [place] [address]    Special Requests   Final    NONE Performed at [facility], [place] [address] [place], [place] [address]    Gram Stain   Final    WBC PRESENT, PREDOMINANTLY PMN NO ORGANISMS SEEN CYTOSPIN SMEAR    Culture   Final    NO GROWTH < 24 HOURS Performed at [facility], [place] [address], [place], [place] [address]    Report Status PENDING  Incomplete  MRSA Next Gen by PCR, Nasal     Status: None   Collection Time: 07/06/22  1:28 PM   Specimen: Nasal Mucosa; Nasal Swab  Result Value Ref Range Status   MRSA by PCR Next Gen NOT DETECTED NOT DETECTED Final    Comment: (NOTE) The GeneXpert MRSA Assay (FDA approved for NASAL specimens only), is one component of a comprehensive MRSA colonization surveillance program. It is not intended to diagnose MRSA infection nor to guide or monitor treatment for MRSA infections. Test performance is not FDA approved in patients less than 34 years old. Performed at [facility], [place] [address], [place], [place] [address]          Radiology Studies: DG Chest 1 View  Result Date: 07/07/2022 CLINICAL DATA:  Status post chest tube removal EXAM: PORTABLE CHEST 1 VIEW COMPARISON:  Film from earlier in the same day. FINDINGS: Left-sided pigtail catheter has been removed in the interval. Cardiac shadow remains enlarged. Pacing device is unchanged. Small left effusion and residual left basilar atelectasis are seen. Postsurgical changes are noted in the right apex stable from the prior exam. No recurrent left-sided pneumothorax is seen. IMPRESSION: Persistent left basilar atelectasis and effusion. No pneumothorax is noted following chest tube removal. Electronically Signed   By: Inez Catalina M.D.   On: 07/07/2022 11:50   DG Chest 1 View  Result Date: 07/07/2022 CLINICAL DATA:  Chest tube EXAM: CHEST  1 VIEW COMPARISON:  Chest 07/06/2022 FINDINGS: Pigtail chest tube in the left lung  base unchanged. Small left basilar pneumothorax has resolved. No residual pneumothorax identified. Left lower lobe atelectasis and small effusion unchanged Cardiac enlargement.  Dual lead pacemaker unchanged. Postsurgical changes right upper lobe with resection of upper ribs. Lung appears to be protruding through the defect into the right lower neck unchanged. Right lung is clear. IMPRESSION: 1. Resolution of left pneumothorax. Left chest tube remains in place. 2. Left lower lobe atelectasis and small effusion unchanged. Electronically Signed   By: Franchot Gallo M.D.   On: 07/07/2022 08:30   DG Chest  1 View  Result Date: 07/06/2022 CLINICAL DATA:  Left chest tube placement EXAM: CHEST  1 VIEW COMPARISON:  Chest x-ray 07/04/2022 FINDINGS: Very small left hydropneumothorax present inferiorly. New left pleural drainage catheter is noted inferiorly. Left pleural effusion has nearly completely resolved. There is left basilar atelectasis. Postsurgical changes in the right lung apex appear unchanged. Cardiac silhouette is enlarged, unchanged. Left-sided pacemaker is stable in position. No acute fractures. IMPRESSION: Small inferior left hydropneumothorax following left pleural drainage catheter placement. Left pleural effusion has nearly completely resolved. Electronically Signed   By: Ronney Asters M.D.   On: 07/06/2022 18:11   DG ESOPHAGUS W SINGLE CM (SOL OR THIN BA)  Result Date: 07/05/2022 CLINICAL DATA:  Dysphagia EXAM: ESOPHOGRAM/BARIUM SWALLOW TECHNIQUE: Single contrast examination was performed using  thin barium. FLUOROSCOPY: Radiation Exposure Index (as provided by the fluoroscopic device): 3.6 mGy Kerma COMPARISON:  None Available. FINDINGS: Limited upright LPO single contrast esophagram. There is tortuosity of the upper thoracic esophagus. There is severe esophageal dysmotility. There is pooling of contrast in the proximal esophagus with significant delayed passage into the distal esophagus and  stomach. No aspiration occurred during the exam. A 13 mm barium tablet was not given. Postoperative changes of the right lower neck and upper right ribs noted. IMPRESSION: Severe esophageal dysmotility. Electronically Signed   By: Maurine Simmering M.D.   On: 07/05/2022 15:34        Scheduled Meds:  docusate sodium  100 mg Oral BID   fluticasone furoate-vilanterol  1 puff Inhalation Daily   And   umeclidinium bromide  1 puff Inhalation Daily   furosemide  40 mg Intravenous Daily   guaiFENesin  1,200 mg Oral BID   levothyroxine  125 mcg Oral Q0600   mouth rinse  15 mL Mouth Rinse 4 times per day   Rivaroxaban  15 mg Oral Q supper   sodium chloride flush  10 mL Intrapleural Q8H   Continuous Infusions:  ceFEPime (MAXIPIME) IV 2 g (07/07/22 0320)   vancomycin 750 mg (07/06/22 1631)     LOS: 3 days    Time spent: 35 minutes    Barb Merino, MD [facility] Pager [phone]

## 2022-07-08 ENCOUNTER — Telehealth: Payer: Self-pay | Admitting: Pulmonary Disease

## 2022-07-08 ENCOUNTER — Other Ambulatory Visit: Payer: Self-pay | Admitting: Acute Care

## 2022-07-08 ENCOUNTER — Telehealth: Payer: Self-pay | Admitting: Acute Care

## 2022-07-08 ENCOUNTER — Telehealth: Payer: Self-pay

## 2022-07-08 DIAGNOSIS — J9601 Acute respiratory failure with hypoxia: Secondary | ICD-10-CM | POA: Diagnosis not present

## 2022-07-08 DIAGNOSIS — J181 Lobar pneumonia, unspecified organism: Secondary | ICD-10-CM

## 2022-07-08 LAB — ACID FAST SMEAR (AFB, MYCOBACTERIA): Acid Fast Smear: NEGATIVE

## 2022-07-08 MED ORDER — FUROSEMIDE 40 MG PO TABS: 40.0000 mg | ORAL_TABLET | Freq: Every day | ORAL | Status: DC

## 2022-07-08 MED ORDER — AMOXICILLIN-POT CLAVULANATE 875-125 MG PO TABS: 1.0000 | ORAL_TABLET | Freq: Two times a day (BID) | ORAL | 0 refills | Status: DC

## 2022-07-08 MED ORDER — GUAIFENESIN ER 600 MG PO TB12: 1200.0000 mg | ORAL_TABLET | Freq: Two times a day (BID) | ORAL | 0 refills | Status: DC

## 2022-07-08 NOTE — Telephone Encounter (Signed)
Scheduled for HFU on 11/22

## 2022-07-08 NOTE — Telephone Encounter (Signed)
Transition Care Management Unsuccessful Follow-up Telephone Call  Date of discharge and from where:  [facility],   Attempts:  1st Attempt  Reason for unsuccessful TCM follow-up call:  Unable to reach patient, Unable to be reached ;due to release into a skilled nursing facility.

## 2022-07-08 NOTE — Progress Notes (Signed)
Physical Therapy Treatment Patient Details Name: Robert [name]. MRN: [id number] DOB: 04/13/1929 Today's Date: 07/08/2022   History of Present Illness 88 year old male who I followed in the past for bronchiectasis and a distant history of lung cancer admitted on 10/31 in the setting of acute hypoxemic respiratory failure and a new left pleural effusion causing compressive atelectasis of the left lung. Chest tube placed and removed prior to therapy evaluation.    PT Comments    The patient  demonstrates improved strength and tolerance to exercise, ambulated x 60' . Patient and wife indicate understanding of slow and intermittent activity. Plans Dc today.  Recommendations for follow up therapy are one component of a multi-disciplinary discharge planning process, led by the attending physician.  Recommendations may be updated based on patient status, additional functional criteria and insurance authorization.  Follow Up Recommendations  Home health PT     Assistance Recommended at Discharge Frequent or constant Supervision/Assistance  Patient can return home with the following A little help with bathing/dressing/bathroom;Help with stairs or ramp for entrance;Assistance with cooking/housework;Assist for transportation;A little help with walking and/or transfers   Equipment Recommendations  None recommended by PT    Recommendations for Other Services       Precautions / Restrictions Precautions Precautions: Fall Precaution Comments: monitor o2 Restrictions Weight Bearing Restrictions: No     Mobility  Bed Mobility               General bed mobility comments: in recliner    Transfers   Equipment used: Rolling walker (2 wheels) Transfers: Sit to/from Stand Sit to Stand: Min guard                Ambulation/Gait Ambulation/Gait assistance: Min guard Gait Distance (Feet): 60 Feet Assistive device: Rolling walker (2 wheels) Gait Pattern/deviations: Step-to  pattern, Step-through pattern, Trunk flexed Gait velocity: decr     General Gait Details: pt with increased WOB near end of  walk   Stairs             Wheelchair Mobility    Modified Rankin (Stroke Patients Only)       Balance Overall balance assessment: Mild deficits observed, not formally tested                                          Cognition Arousal/Alertness: Awake/alert Behavior During Therapy: WFL for tasks assessed/performed Overall Cognitive Status: Within Functional Limits for tasks assessed                                          Exercises      General Comments        Pertinent Vitals/Pain Pain Assessment Pain Assessment: No/denies pain    Home Living                          Prior Function            PT Goals (current goals can now be found in the care plan section) Progress towards PT goals: Progressing toward goals    Frequency    Min 3X/week      PT Plan Current plan remains appropriate    Co-evaluation PT/OT/SLP Co-Evaluation/Treatment: Yes Reason for Co-Treatment: For patient/therapist safety PT goals  addressed during session: Mobility/safety with mobility OT goals addressed during session: ADL's and self-care      AM-PAC PT "6 Clicks" Mobility   Outcome Measure  Help needed turning from your back to your side while in a flat bed without using bedrails?: A Little Help needed moving from lying on your back to sitting on the side of a flat bed without using bedrails?: A Little Help needed moving to and from a bed to a chair (including a wheelchair)?: A Little Help needed standing up from a chair using your arms (e.g., wheelchair or bedside chair)?: A Little Help needed to walk in hospital room?: A Little Help needed climbing 3-5 steps with a railing? : A Lot 6 Click Score: 17    End of Session Equipment Utilized During Treatment: Oxygen Activity Tolerance: Patient  tolerated treatment well Patient left: in chair;with call bell/phone within reach;with family/visitor present Nurse Communication: Mobility status PT Visit Diagnosis: Unsteadiness on feet (R26.81);Muscle weakness (generalized) (M62.81)     Time: 3212-2482 PT Time Calculation (min) (ACUTE ONLY): 9 min  Charges:  $Gait Training: 8-22 mins                     [place] Office [phone] Weekend [phone]    Claretha Cooper 07/08/2022, 3:13 PM

## 2022-07-08 NOTE — Telephone Encounter (Signed)
I was notified by our triage team that patient was discharged from the hospital today and his prescription for Augmentin , prescribed by the hospitalist   who discharged him , was not received at the pharmacy. I have called and spoken with the patient's wife. She states he has a lower lobe pneumonia with pleural effusion that required thoracentesis and then chest tube placement.Chest tube was removed prior to discharge.  Discharge note specifies that patient is to take  Augmentin 875/125 x 7 days.   Per DC Note 07/08/2022>> Dr. Barb Merino Patient is needing 2 L oxygen with mobility today.  He will be going home with oxygen.  He desires to go home, will treat with 7 additional days of Augmentin to complete 10 days of therapy for complicated pneumonia.   moxicillin-clavulanate 875-125 MG tablet Commonly known as: AUGMENTIN Take 1 tablet by mouth 2 (two) times daily for 7 days.  As patient is a [facility] patient, I have refilled the Augmentin as prescribed on the discharge paperwork. I have also asked that the patient take this medication with a probiotic as it can cause GI side effects.  I have never seen this patient , so I called to confirm this information with his wife. Patient has follow up with Dr. Lake Bells 07/27/2022 at 11:15 am. I have asked them to follow up sooner if he has any respiratory issues prior to the follow up appointment.  Rx sent, and spoke with wife regarding treatment and follow up

## 2022-07-08 NOTE — Discharge Summary (Signed)
Physician Discharge Summary  Robert [name]. STM:[id number] DOB: 26-Nov-1928 DOA: 07/04/2022  PCP: [name], MD  Admit date: 07/04/2022 Discharge date: 07/08/2022  Admitted From: Home Disposition: Home with home health  Recommendations for Outpatient Follow-up:  Follow up with PCP in 1-2 weeks Pulmonary will schedule follow-up  Home Health: PT/OT Equipment/Devices: Walker  Discharge Condition: Stable CODE STATUS: Full code Diet recommendation: Regular diet, aspiration precautions  Discharge summary: 88 year old with hypothyroidism, COPD, paroxysmal atrial fibrillation on Xarelto, complete heart block status post pacemaker, CKD stage IIIb presented with SOB .  Does not wear oxygen at home.  He went to pulmonology office and found to be on room air and 82% and cyanotic. CT scan showed bronchiectasis, left sided consolidation and pleural effusion.  Patient was treated with antibiotics from clinic for more than 2 weeks.  He also has history of significant dysphagia.     # Left lower lobe pneumonia with parapneumonic effusion, acute hypoxemic respiratory failure as evidenced by saturation 82% on arrival.  Failed outpatient therapies. Treated with vancomycin and cefepime, day 3 today.  Blood cultures negative.  Sputum cultures negative.  Thoracentesis and cultures negative.   Thoracentesis left chest , 758 removed, LDH more than thousand and cell count more than 8000. Chest tube placed 11/1, removed 11/2.  800 mL drained.   Repeat chest x-ray stable with some layering pleural effusion.   Continue aggressive chest physiotherapy, incentive, mucolytic's and bronchodilators at home. Significant dysphagia, barium esophagogram shows tortuous esophagus and probably contributing to chronic aspiration.  Aspiration precautions.  Unlikely patient has any surgical intervention that will be helpful.   Patient is needing 2 L oxygen with mobility today.  He will be going home with oxygen.  He  desires to go home, will treat with 7 additional days of Augmentin to complete 10 days of therapy for complicated pneumonia.   Chronic medical issues including Hypothyroidism COPD Paroxysmal A-fib on Xarelto CKD stage IIIb Chronic diastolic heart failure Remain stable.   Stable for discharge with home health PT OT and oxygen at home.   Discharge Diagnoses:  Principal Problem:   Acute respiratory failure with hypoxia (HCC) Active Problems:   Atrial fibrillation (HCC)   Hypothyroidism, unspecified   Stage 3b chronic kidney disease (CKD) (HCC)   Acute on chronic diastolic CHF (congestive heart failure) (HCC)   AKI (acute kidney injury) (HCC)   COPD (chronic obstructive pulmonary disease) (HCC)   Macrocytic anemia   Protein-calorie malnutrition, severe    Discharge Instructions  Discharge Instructions     Call MD for:  difficulty breathing, headache or visual disturbances   Complete by: As directed    Call MD for:  temperature >100.4   Complete by: As directed    Diet - low sodium heart healthy   Complete by: As directed    Increase activity slowly   Complete by: As directed    Leave dressing on - Keep it clean, dry, and intact until clinic visit   Complete by: As directed       Allergies as of 07/08/2022   No Known Allergies      Medication List     TAKE these medications    albuterol 108 (90 Base) MCG/ACT inhaler Commonly known as: VENTOLIN HFA Inhale 2 puffs into the lungs every 6 (six) hours as needed for wheezing or shortness of breath. What changed: Another medication with the same name was changed. Make sure you understand how and when to take each.  albuterol (2.5 MG/3ML) 0.083% nebulizer solution Commonly known as: PROVENTIL USE 1 VIAL IN NEBULIZER EVERY 6 HOURS AS NEEDED FOR WHEEZING FOR SHORTNESS OF BREATH What changed:  how much to take how to take this when to take this reasons to take this additional instructions    amoxicillin-clavulanate 875-125 MG tablet Commonly known as: AUGMENTIN Take 1 tablet by mouth 2 (two) times daily for 7 days.   ascorbic acid 500 MG tablet Commonly known as: VITAMIN C Take 500 mg by mouth daily.   docusate sodium 100 MG capsule Commonly known as: COLACE Take 100 mg by mouth 2 (two) times daily.   ferrous sulfate 325 (65 FE) MG EC tablet Take 325 mg by mouth daily.   furosemide 40 MG tablet Commonly known as: LASIX Take 1 tablet (40 mg total) by mouth daily.   guaiFENesin 600 MG 12 hr tablet Commonly known as: MUCINEX Take 2 tablets (1,200 mg total) by mouth 2 (two) times daily.   HYDROcodone-acetaminophen 10-325 MG tablet Commonly known as: NORCO 3 (three) times daily as needed.   levothyroxine 125 MCG tablet Commonly known as: SYNTHROID TAKE 1 TABLET BY MOUTH EVERY DAY What changed: when to take this   multivitamin tablet Take 1 tablet by mouth daily.   psyllium 0.52 g capsule Commonly known as: REGULOID Take 0.52 g by mouth daily. Fiber   SENNA CO 1-2 tablets by Combination route every evening.   Trelegy Ellipta 100-62.5-25 MCG/ACT Aepb Generic drug: Fluticasone-Umeclidin-Vilant Inhale 1 puff into the lungs daily.   Vitamin D 50 MCG (2000 UT) tablet Take 2,000 Units by mouth daily.   Xarelto 15 MG Tabs tablet Generic drug: Rivaroxaban TAKE 1 TABLET BY MOUTH EVERY DAY WITH SUPPER What changed: See the new instructions.               Durable Medical Equipment  (From admission, onward)           Start     Ordered   07/07/22 1633  For home use only DME oxygen  Once       Comments: Patient Saturations on Room Air at Rest = 92%   Patient Saturations on Room Air while Ambulating = 85%( 6')   Patient Saturations on 2 Liters of oxygen while Ambulating = 94%(10'  tolerated only)  Question Answer Comment  Length of Need Lifetime   Mode or (Route) Nasal cannula   Liters per Minute 2   Frequency Continuous (stationary and  portable oxygen unit needed)   Oxygen conserving device Yes   Oxygen delivery system Gas      07/07/22 1632   07/07/22 1345  For home use only DME 3 n 1  Once        07/07/22 1345              Discharge Care Instructions  (From admission, onward)           Start     Ordered   07/08/22 0000  Leave dressing on - Keep it clean, dry, and intact until clinic visit        07/08/22 1128            Follow-up Information     Juanito Doom, MD Follow up.   Specialty: Pulmonary Disease Why: 1-2 weeks to follow up your effusion and breathing Contact information: [address] [place] [address] [phone]                No  Known Allergies  Consultations: Pulmonary   Procedures/Studies: DG Chest 1 View  Result Date: 07/07/2022 CLINICAL DATA:  Status post chest tube removal EXAM: PORTABLE CHEST 1 VIEW COMPARISON:  Film from earlier in the same day. FINDINGS: Left-sided pigtail catheter has been removed in the interval. Cardiac shadow remains enlarged. Pacing device is unchanged. Small left effusion and residual left basilar atelectasis are seen. Postsurgical changes are noted in the right apex stable from the prior exam. No recurrent left-sided pneumothorax is seen. IMPRESSION: Persistent left basilar atelectasis and effusion. No pneumothorax is noted following chest tube removal. Electronically Signed   By: Inez Catalina M.D.   On: 07/07/2022 11:50   DG Chest 1 View  Result Date: 07/07/2022 CLINICAL DATA:  Chest tube EXAM: CHEST  1 VIEW COMPARISON:  Chest 07/06/2022 FINDINGS: Pigtail chest tube in the left lung base unchanged. Small left basilar pneumothorax has resolved. No residual pneumothorax identified. Left lower lobe atelectasis and small effusion unchanged Cardiac enlargement.  Dual lead pacemaker unchanged. Postsurgical changes right upper lobe with resection of upper ribs. Lung appears to be protruding through the defect into the right  lower neck unchanged. Right lung is clear. IMPRESSION: 1. Resolution of left pneumothorax. Left chest tube remains in place. 2. Left lower lobe atelectasis and small effusion unchanged. Electronically Signed   By: Franchot Gallo M.D.   On: 07/07/2022 08:30   DG Chest 1 View  Result Date: 07/06/2022 CLINICAL DATA:  Left chest tube placement EXAM: CHEST  1 VIEW COMPARISON:  Chest x-ray 07/04/2022 FINDINGS: Very small left hydropneumothorax present inferiorly. New left pleural drainage catheter is noted inferiorly. Left pleural effusion has nearly completely resolved. There is left basilar atelectasis. Postsurgical changes in the right lung apex appear unchanged. Cardiac silhouette is enlarged, unchanged. Left-sided pacemaker is stable in position. No acute fractures. IMPRESSION: Small inferior left hydropneumothorax following left pleural drainage catheter placement. Left pleural effusion has nearly completely resolved. Electronically Signed   By: Ronney Asters M.D.   On: 07/06/2022 18:11   DG ESOPHAGUS W SINGLE CM (SOL OR THIN BA)  Result Date: 07/05/2022 CLINICAL DATA:  Dysphagia EXAM: ESOPHOGRAM/BARIUM SWALLOW TECHNIQUE: Single contrast examination was performed using  thin barium. FLUOROSCOPY: Radiation Exposure Index (as provided by the fluoroscopic device): 3.6 mGy Kerma COMPARISON:  None Available. FINDINGS: Limited upright LPO single contrast esophagram. There is tortuosity of the upper thoracic esophagus. There is severe esophageal dysmotility. There is pooling of contrast in the proximal esophagus with significant delayed passage into the distal esophagus and stomach. No aspiration occurred during the exam. A 13 mm barium tablet was not given. Postoperative changes of the right lower neck and upper right ribs noted. IMPRESSION: Severe esophageal dysmotility. Electronically Signed   By: Maurine Simmering M.D.   On: 07/05/2022 15:34   DG CHEST PORT 1 VIEW  Result Date: 07/05/2022 CLINICAL DATA:   Provided history: Status post thoracentesis. EXAM: PORTABLE CHEST 1 VIEW COMPARISON:  Prior chest radiographs 07/04/2022 and earlier. Chest CT 07/04/2022. FINDINGS: Left chest dual lead pacer device. Cardiomegaly, unchanged. Aortic atherosclerosis. Persistent small left pleural effusion, decreased in size from the prior examination of 07/04/2022. Associated left basilar atelectasis and/or consolidation. Focus of chronic consolidation within the right middle lobe, more fully characterized on yesterday's chest CT. No evidence of pneumothorax. No acute bony abnormality identified. IMPRESSION: No evidence of pneumothorax status post thoracentesis. Small left pleural effusion, decreased in size. Associated left basilar atelectasis and/or consolidation. Focus of chronic consolidation within the right middle lobe,  more fully characterized on yesterday's chest CT. Please refer to this prior examination for further description. Cardiomegaly. Aortic Atherosclerosis (ICD10-I70.0). Electronically Signed   By: Kellie Simmering D.O.   On: 07/05/2022 11:21   ECHOCARDIOGRAM COMPLETE  Result Date: 07/05/2022    ECHOCARDIOGRAM REPORT   Patient Name:   Robert [name]. Date of Exam: 07/05/2022 Medical Rec #:  [id number]           Height:       72.0 in Accession #:    [id number]          Weight:       163.4 lb Date of Birth:  [birth year]           BSA:          1.955 m Patient Age:    73 years            BP:           120/70 mmHg Patient Gender: M                   HR:           66 bpm. Exam Location:  Inpatient Procedure: 2D Echo                                 MODIFIED REPORT: This report was modified by Rudean Haskell MD on 07/05/2022 due to comment                           on study quality/difficulty.  Indications:     CHF  History:         Patient has no prior history of Echocardiogram examinations.                  CHF, COPD, Arrythmias:Atrial Fibrillation; Risk                  Factors:Dyslipidemia.  Sonographer:      Harvie Junior Referring Phys:  [id number] Jonnie Finner Diagnosing Phys: Rudean Haskell MD  Sonographer Comments: Technically difficult study due to poor echo windows. Image acquisition challenging due to respiratory motion and Image acquisition challenging due to COPD. IMPRESSIONS  1. Left ventricular ejection fraction, by estimation, is 50 to 55%. The left ventricle has low normal function. The left ventricle has no regional wall motion abnormalities. Left ventricular diastolic parameters are indeterminate.  2. Right ventricular systolic function is normal. The right ventricular size is normal. A device lead not well visualized in this study is visualized. There is normal pulmonary artery systolic pressure.  3. Left atrial size was mildly dilated.  4. The mitral valve is normal in structure. No evidence of mitral valve regurgitation. No evidence of mitral stenosis.  5. The aortic valve was not well visualized. Aortic valve regurgitation is not visualized. No aortic stenosis is present.  6. Technically difficult study. Comparison(s): No prior Echocardiogram. FINDINGS  Left Ventricle: Left ventricular ejection fraction, by estimation, is 50 to 55%. The left ventricle has low normal function. The left ventricle has no regional wall motion abnormalities. The left ventricular internal cavity size was normal in size. There is no left ventricular hypertrophy. Left ventricular diastolic parameters are indeterminate. Right Ventricle: The right ventricular size is normal. No increase in right ventricular wall thickness. Right ventricular systolic function is normal. There is normal pulmonary artery systolic pressure. The  tricuspid regurgitant velocity is 2.39 m/s, and  with an assumed right atrial pressure of 3 mmHg, the estimated right ventricular systolic pressure is 70.6 mmHg. Left Atrium: Left atrial size was mildly dilated. Right Atrium: Right atrial size was normal in size. Pericardium: There is no evidence of  pericardial effusion. Mitral Valve: The mitral valve is normal in structure. No evidence of mitral valve regurgitation. No evidence of mitral valve stenosis. Tricuspid Valve: The tricuspid valve is normal in structure. Tricuspid valve regurgitation is not demonstrated. No evidence of tricuspid stenosis. Aortic Valve: The aortic valve was not well visualized. Aortic valve regurgitation is not visualized. No aortic stenosis is present. Aortic valve mean gradient measures 4.0 mmHg. Aortic valve peak gradient measures 6.9 mmHg. Aortic valve area, by VTI measures 2.96 cm. Pulmonic Valve: The pulmonic valve was not well visualized. Pulmonic valve regurgitation is not visualized. Aorta: The aortic root and ascending aorta are structurally normal, with no evidence of dilitation. IAS/Shunts: No atrial level shunt detected by color flow Doppler. Additional Comments: A device lead not well visualized in this study is visualized.  LEFT VENTRICLE PLAX 2D LVIDd:         4.10 cm     Diastology LVIDs:         2.80 cm     LV e' medial:    6.53 cm/s LV PW:         0.80 cm     LV E/e' medial:  15.8 LV IVS:        0.80 cm     LV e' lateral:   7.40 cm/s LVOT diam:     2.10 cm     LV E/e' lateral: 13.9 LV SV:         63 LV SV Index:   32 LVOT Area:     3.46 cm  LV Volumes (MOD) LV vol d, MOD A2C: 68.5 ml LV vol d, MOD A4C: 94.8 ml LV vol s, MOD A2C: 28.0 ml LV vol s, MOD A4C: 44.7 ml LV SV MOD A2C:     40.5 ml LV SV MOD A4C:     94.8 ml LV SV MOD BP:      45.3 ml RIGHT VENTRICLE RV Basal diam:  3.30 cm RV Mid diam:    2.70 cm TAPSE (M-mode): 2.4 cm LEFT ATRIUM             Index        RIGHT ATRIUM           Index LA diam:        2.90 cm 1.48 cm/m   RA Area:     31.50 cm LA Vol (A2C):   74.2 ml 37.95 ml/m  RA Volume:   119.00 ml 60.87 ml/m LA Vol (A4C):   66.4 ml 33.96 ml/m LA Biplane Vol: 71.6 ml 36.62 ml/m  AORTIC VALVE                    PULMONIC VALVE AV Area (Vmax):    2.16 cm     PV Vmax:       0.87 m/s AV Area (Vmean):    2.41 cm     PV Peak grad:  3.0 mmHg AV Area (VTI):     2.96 cm AV Vmax:           131.00 cm/s AV Vmean:          89.400 cm/s AV VTI:  0.214 m AV Peak Grad:      6.9 mmHg AV Mean Grad:      4.0 mmHg LVOT Vmax:         81.60 cm/s LVOT Vmean:        62.200 cm/s LVOT VTI:          0.183 m LVOT/AV VTI ratio: 0.86  AORTA Ao Root diam: 3.40 cm Ao Asc diam:  2.70 cm MITRAL VALVE                TRICUSPID VALVE MV Area (PHT): 4.60 cm     TR Peak grad:   22.8 mmHg MV Decel Time: 165 msec     TR Vmax:        239.00 cm/s MR Peak grad: 28.1 mmHg MR Vmax:      265.00 cm/s   SHUNTS MV E velocity: 103.00 cm/s  Systemic VTI:  0.18 m MV A velocity: 38.10 cm/s   Systemic Diam: 2.10 cm MV E/A ratio:  2.70 Rudean Haskell MD Electronically signed by Rudean Haskell MD Signature Date/Time: 07/05/2022/11:10:30 AM    Final (Updated)    US RENAL  Result Date: 07/04/2022 CLINICAL DATA:  7 year old with acute kidney injury. EXAM: RENAL / URINARY TRACT ULTRASOUND COMPLETE COMPARISON:  None Available. FINDINGS: Right Kidney: Renal measurements: 10.6 x 4.2 x 4.7 cm = volume: 107 mL. Normal parenchymal echogenicity. No hydronephrosis. No visualized stone or focal lesion. Left Kidney: Renal measurements: 10.3 x 4.9 x 5 cm = volume: 132 mL. Normal parenchymal echogenicity. No hydronephrosis. No visualized stone or focal lesion. Bladder: Appears normal for degree of bladder distention. Other: None. IMPRESSION: No obstructive uropathy. No acute findings or explanation for acute kidney injury. Electronically Signed   By: Keith Rake M.D.   On: 07/04/2022 21:10   CT Chest Wo Contrast  Result Date: 07/04/2022 CLINICAL DATA:  Pneumonia, complication suspected, xray done EXAM: CT CHEST WITHOUT CONTRAST TECHNIQUE: Multidetector CT imaging of the chest was performed following the standard protocol without IV contrast. RADIATION DOSE REDUCTION: This exam was performed according to the departmental dose-optimization  program which includes automated exposure control, adjustment of the mA and/or kV according to patient size and/or use of iterative reconstruction technique. COMPARISON:  Radiograph earlier today. PET CT 10/25/2021, chest CT 09/10/2021 FINDINGS: Cardiovascular: Left-sided pacemaker in place with lead tips projecting over the right atrium and ventricle. Multi chamber cardiomegaly. Prominent main pulmonary artery at 3.3 cm. Small amount of fluid in the superior pericardial recess without significant pericardial effusion. Aortic atherosclerosis and tortuosity. No periaortic stranding. Mediastinum/Nodes: 14 mm subcarinal node series 2, image 66, previously 20 mm. 15 mm low right infrahilar node, series 2, image 84, unchanged. Additional scattered small mediastinal lymph nodes that are not enlarged by size criteria in similar to prior exam. Assessment for hilar adenopathy is significantly limited in the absence of IV contrast. Patulous esophagus with retained debris. Lungs/Pleura: Moderate size left pleural effusion has increased in size from prior imaging. Small amount of fluid tracks in the inter lobar fissure. Near complete consolidation of the left lower lobe. Postsurgical volume loss in the right hemithorax. Chronic area of consolidation in the right middle lobe with air bronchograms, unchanged in appearance from prior imaging. Areas of ground-glass opacity in septal thickening in the right lower lobe, also chronic and similar in appearance to prior exam. Upper Abdomen: No acute upper abdominal findings. Musculoskeletal: Chronic C7 compression fracture. Chronic T7 compression fracture. Postsurgical change of the right upper hemithorax. Thoracic spondylosis  with spurring. Pectus excavatum deformity. No focal bone lesions. The bones are diffusely under mineralized. IMPRESSION: 1. Moderate size left pleural effusion has increased in size from prior imaging. Near complete consolidation of the left lower lobe, favor  pneumonia over compressive atelectasis. 2. Chronic area of consolidation in the right middle lobe with air bronchograms, unchanged in appearance from prior imaging. Scarring is favored. 3. Areas of ground-glass opacity and septal thickening in the right lower lobe are also chronic and similar in appearance to prior imaging. 4. Cardiomegaly. Prominent main pulmonary artery suggesting pulmonary arterial hypertension. 5. Patulous esophagus with retained debris. Aortic Atherosclerosis (ICD10-I70.0). Electronically Signed   By: Keith Rake M.D.   On: 07/04/2022 15:36   DG Chest Port 1 View  Result Date: 07/04/2022 CLINICAL DATA:  Shortness of breath EXAM: PORTABLE CHEST 1 VIEW COMPARISON:  10/25/2021, 08/12/2021 FINDINGS: Left-sided implanted cardiac device remains in place. Cardiomegaly. Aortic atherosclerosis. Moderate-sized left-sided pleural effusion with associated left basilar opacity. No pneumothorax. Postsurgical changes of the upper right hemithorax. IMPRESSION: New moderate-sized left-sided pleural effusion with associated left basilar opacity. Electronically Signed   By: Davina Poke D.O.   On: 07/04/2022 13:11   (Echo, Carotid, EGD, Colonoscopy, ERCP)    Subjective: Patient seen and examined.  He was not happy not able to go home yesterday.  Today he thinks he can walk around and has to go home.  Afebrile.  Using incentive.   Discharge Exam: Vitals:   07/07/22 1954 07/08/22 0812  BP: 99/63   Pulse: 74   Resp: 18   Temp: 97.6 F (36.4 C)   SpO2: 100% 98%   Vitals:   07/07/22 1254 07/07/22 1954 07/08/22 0500 07/08/22 0812  BP: 115/76 99/63    Pulse: 84 74    Resp: 20 18    Temp: 97.6 F (36.4 C) 97.6 F (36.4 C)    TempSrc: Oral Oral    SpO2: 90% 100%  98%  Weight:   69.6 kg   Height:        General: Pt is alert, awake, not in acute distress, not in any distress.  Able to talk in complete sentences. Cardiovascular: RRR, S1/S2 +, no rubs, no gallops, pacemaker left  precordium. Respiratory: Poor air entry left base.  On 2 L oxygen.  Not in any distress. Abdominal: Soft, NT, ND, bowel sounds + Extremities: no edema, no cyanosis    The results of significant diagnostics from this hospitalization (including imaging, microbiology, ancillary and laboratory) are listed below for reference.     Microbiology: Recent Results (from the past 240 hour(s))  SARS Coronavirus 2 by RT PCR (hospital order, performed in [facility] hospital lab) *cepheid single result test* Anterior Nasal Swab     Status: None   Collection Time: 07/04/22  2:50 PM   Specimen: Anterior Nasal Swab  Result Value Ref Range Status   SARS Coronavirus 2 by RT PCR NEGATIVE NEGATIVE Final    Comment: (NOTE) SARS-CoV-2 target nucleic acids are NOT DETECTED.  The SARS-CoV-2 RNA is generally detectable in upper and lower respiratory specimens during the acute phase of infection. The lowest concentration of SARS-CoV-2 viral copies this assay can detect is 250 copies / mL. A negative result does not preclude SARS-CoV-2 infection and should not be used as the sole basis for treatment or other patient management decisions.  A negative result may occur with improper specimen collection / handling, submission of specimen other than nasopharyngeal swab, presence of viral mutation(s) within the areas  targeted by this assay, and inadequate number of viral copies (<250 copies / mL). A negative result must be combined with clinical observations, patient history, and epidemiological information.  Fact Sheet for Patients:   https://www.patel.info/  Fact Sheet for Healthcare Providers: https://hall.com/  This test is not yet approved or  cleared by the [place] FDA and has been authorized for detection and/or diagnosis of SARS-CoV-2 by FDA under an Emergency Use Authorization (EUA).  This EUA will remain in effect (meaning this test can be used) for  the duration of the COVID-19 declaration under Section 564(b)(1) of the Act, 21 U.S.C. section 360bbb-3(b)(1), unless the authorization is terminated or revoked sooner.  Performed at [facility], [place] [address] [place], [place] [address]   Body fluid culture w Gram Stain     Status: None (Preliminary result)   Collection Time: 07/05/22 10:48 AM   Specimen: Pleural Fluid  Result Value Ref Range Status   Specimen Description   Final    PLEURAL LT Performed at [facility] [address], [place], [place] [address]    Special Requests   Final    NONE Performed at [facility], [place] [address] [place], [place] [address]    Gram Stain   Final    WBC PRESENT, PREDOMINANTLY PMN NO ORGANISMS SEEN CYTOSPIN SMEAR    Culture   Final    NO GROWTH 2 DAYS Performed at [facility], [place] [address], [place], [place] [address]    Report Status PENDING  Incomplete  MRSA Next Gen by PCR, Nasal     Status: None   Collection Time: 07/06/22  1:28 PM   Specimen: Nasal Mucosa; Nasal Swab  Result Value Ref Range Status   MRSA by PCR Next Gen NOT DETECTED NOT DETECTED Final    Comment: (NOTE) The GeneXpert MRSA Assay (FDA approved for NASAL specimens only), is one component of a comprehensive MRSA colonization surveillance program. It is not intended to diagnose MRSA infection nor to guide or monitor treatment for MRSA infections. Test performance is not FDA approved in patients less than 68 years old. Performed at [facility], [place] [address], [place], [place] [address]      Labs: BNP (last 3 results) Recent Labs    07/04/22 1406  BNP 638.7*   Basic Metabolic Panel: Recent Labs  Lab 07/04/22 1406 07/05/22 0519 07/06/22 1219 07/07/22 0522  NA 137 140  --  141  K 3.8 3.9  --  3.8  CL 102 106  --  103  CO2 24 24  --  29  GLUCOSE 120* 139*  --  139*  BUN 37* 44*  --  48*  CREATININE 1.90* 1.92*  1.56* 1.59*  CALCIUM 9.0 9.0  --  9.2  MG  --   --   --  2.4  PHOS  --   --   --  3.5   Liver Function Tests: Recent Labs  Lab 07/04/22 1406 07/05/22 0519  AST 27 34  ALT 17 19  ALKPHOS 63 60  BILITOT 1.5* 1.0  PROT 8.0 7.7  ALBUMIN 3.4* 3.2*   No results for input(s): "LIPASE", "AMYLASE" in the last 168 hours. No results for input(s): "AMMONIA" in the last 168 hours. CBC: Recent Labs  Lab 07/04/22 1406 07/05/22 0519 07/07/22 0522  WBC 9.0 14.2* 10.3  NEUTROABS 7.9*  --  7.7  HGB 11.3* 11.2* 12.0*  HCT 35.0* 35.2* 37.5*  MCV 103.6* 104.5* 103.0*  PLT 178 181 219   Cardiac Enzymes: No results for input(s): "CKTOTAL", "CKMB", "CKMBINDEX", "TROPONINI" in the last 168 hours. BNP: Invalid input(s): "POCBNP" CBG: No results for input(s): "GLUCAP" in the last 168 hours. D-Dimer No results for input(s): "DDIMER" in the last 72 hours. Hgb A1c No results for input(s): "HGBA1C" in the last 72 hours. Lipid Profile No results for input(s): "CHOL", "HDL", "LDLCALC", "TRIG", "CHOLHDL", "LDLDIRECT" in the last 72 hours. Thyroid function studies No results for input(s): "TSH", "T4TOTAL", "T3FREE", "THYROIDAB" in the last 72 hours.  Invalid input(s): "FREET3" Anemia work up No results for input(s): "VITAMINB12", "FOLATE", "FERRITIN", "TIBC", "IRON", "RETICCTPCT" in the last 72 hours. Urinalysis No results found for: "COLORURINE", "APPEARANCEUR", "LABSPEC", "PHURINE", "GLUCOSEU", "HGBUR", "BILIRUBINUR", "KETONESUR", "PROTEINUR", "UROBILINOGEN", "NITRITE", "LEUKOCYTESUR" Sepsis Labs Recent Labs  Lab 07/04/22 1406 07/05/22 0519 07/07/22 0522  WBC 9.0 14.2* 10.3   Microbiology Recent Results (from the past 240 hour(s))  SARS Coronavirus 2 by RT PCR (hospital order, performed in [facility] hospital lab) *cepheid single result test* Anterior Nasal Swab     Status: None   Collection Time: 07/04/22  2:50 PM   Specimen: Anterior Nasal Swab  Result Value Ref Range Status   SARS  Coronavirus 2 by RT PCR NEGATIVE NEGATIVE Final    Comment: (NOTE) SARS-CoV-2 target nucleic acids are NOT DETECTED.  The SARS-CoV-2 RNA is generally detectable in upper and lower respiratory specimens during the acute phase of infection. The lowest concentration of SARS-CoV-2 viral copies this assay can detect is 250 copies / mL. A negative result does not preclude SARS-CoV-2 infection and should not be used as the sole basis for treatment or other patient management decisions.  A negative result may occur with improper specimen collection / handling, submission of specimen other than nasopharyngeal swab, presence of viral mutation(s) within the areas targeted by this assay, and inadequate number of viral copies (<250 copies / mL). A negative result must be combined with clinical observations, patient history, and epidemiological information.  Fact Sheet for Patients:   https://www.patel.info/  Fact Sheet for Healthcare Providers: https://hall.com/  This test is not yet approved or  cleared by the [place] FDA and has been authorized for detection and/or diagnosis of SARS-CoV-2 by FDA under an Emergency Use Authorization (EUA).  This EUA will remain in effect (meaning this test can be used) for the duration of the COVID-19 declaration under Section 564(b)(1) of the Act, 21 U.S.C. section 360bbb-3(b)(1), unless the authorization is terminated or revoked sooner.  Performed at [facility], [place] [address] [place], [place] [address]   Body fluid culture w Gram Stain     Status: None (Preliminary result)   Collection Time: 07/05/22 10:48 AM   Specimen: Pleural Fluid  Result Value Ref Range Status   Specimen Description   Final    PLEURAL LT Performed at [place] [address] [place], [place] [address]    Special Requests   Final    NONE Performed at [facility], [place] [address], [place], [place] [address]    Gram Stain   Final    WBC PRESENT, PREDOMINANTLY PMN NO ORGANISMS SEEN CYTOSPIN SMEAR    Culture   Final    NO GROWTH 2 DAYS Performed at [facility], [place] [address], [place], [place] [address]    Report Status PENDING  Incomplete  MRSA Next Gen by PCR, Nasal     Status:  None   Collection Time: 07/06/22  1:28 PM   Specimen: Nasal Mucosa; Nasal Swab  Result Value Ref Range Status   MRSA by PCR Next Gen NOT DETECTED NOT DETECTED Final    Comment: (NOTE) The GeneXpert MRSA Assay (FDA approved for NASAL specimens only), is one component of a comprehensive MRSA colonization surveillance program. It is not intended to diagnose MRSA infection nor to guide or monitor treatment for MRSA infections. Test performance is not FDA approved in patients less than 44 years old. Performed at [facility], [place] [address], [place], [place] [address]      Time coordinating discharge:  35 minutes  SIGNED:   Barb Merino, MD  [facility] 07/08/2022, 11:29 AM

## 2022-07-08 NOTE — Progress Notes (Signed)
Occupational Therapy Treatment Patient Details Name: Robert [name]. MRN: [id number] DOB: 04/14/1929 Today's Date: 07/08/2022   History of present illness 88 year old male who I followed in the past for bronchiectasis and a distant history of lung cancer admitted on 10/31 in the setting of acute hypoxemic respiratory failure and a new left pleural effusion causing compressive atelectasis of the left lung. Chest tube placed and removed prior to therapy evaluation.   OT comments  Treatment focused on activity tolerance and education on energy conservation strategies for home. Patient able to don pants - standing to do so with min guard - without significant SHOB or fatigue today. Patient and spouse verbalize understanding of energy conservation. They will have BSC, wheelchair and oxygen for home use.    Recommendations for follow up therapy are one component of a multi-disciplinary discharge planning process, led by the attending physician.  Recommendations may be updated based on patient status, additional functional criteria and insurance authorization.    Follow Up Recommendations  Home health OT    Assistance Recommended at Discharge Intermittent Supervision/Assistance  Patient can return home with the following  A little help with walking and/or transfers;A lot of help with bathing/dressing/bathroom;Assistance with cooking/housework;Assist for transportation;Help with stairs or ramp for entrance   Equipment Recommendations  BSC/3in1    Recommendations for Other Services      Precautions / Restrictions Precautions Precautions: Fall Precaution Comments: monitor o2 Restrictions Weight Bearing Restrictions: No       Mobility Bed Mobility                    Transfers                         Balance Overall balance assessment: Mild deficits observed, not formally tested                                         ADL either performed or  assessed with clinical judgement   ADL Overall ADL's : Needs assistance/impaired                     Lower Body Dressing: Min guard;Sit to/from stand Lower Body Dressing Details (indicate cue type and reason): Able to don pants himself with therapist providing min guard with standing.               General ADL Comments: Wife reports they will be borrowing a wheelchair for home use. Educated patient and wife on energy conservation strategies for home. They verbalize understanding.    Extremity/Trunk Assessment              Vision Patient Visual Report: No change from baseline     Perception     Praxis      Cognition Arousal/Alertness: Awake/alert Behavior During Therapy: WFL for tasks assessed/performed Overall Cognitive Status: Within Functional Limits for tasks assessed                                          Exercises      Shoulder Instructions       General Comments      Pertinent Vitals/ Pain       Pain Assessment Pain Assessment: No/denies pain  Home Living  Prior Functioning/Environment              Frequency  Min 2X/week        Progress Toward Goals  OT Goals(current goals can now be found in the care plan section)  Progress towards OT goals: Progressing toward goals  Acute Rehab OT Goals Patient Stated Goal: go home soon OT Goal Formulation: With patient Time For Goal Achievement: 07/21/22 Potential to Achieve Goals: [place] Discharge plan remains appropriate    Co-evaluation                 AM-PAC OT "6 Clicks" Daily Activity     Outcome Measure   Help from another person eating meals?: A Little Help from another person taking care of personal grooming?: A Little Help from another person toileting, which includes using toliet, bedpan, or urinal?: A Little Help from another person bathing (including washing, rinsing, drying)?: A  Little Help from another person to put on and taking off regular upper body clothing?: A Little Help from another person to put on and taking off regular lower body clothing?: A Little 6 Click Score: 18    End of Session Equipment Utilized During Treatment: Rolling walker (2 wheels);Oxygen  OT Visit Diagnosis: Muscle weakness (generalized) (M62.81)   Activity Tolerance Patient tolerated treatment well   Patient Left in chair;with call bell/phone within reach;with family/visitor present   Nurse Communication Mobility status        Time: 0051-1021 OT Time Calculation (min): 8 min  Charges: OT General Charges $OT Visit: 1 Visit OT Treatments $Self Care/Home Management : 8-22 mins  Gustavo Lah, OTR/L [place]  Office [phone]   Lenward Chancellor 07/08/2022, 1:17 PM

## 2022-07-08 NOTE — Telephone Encounter (Signed)
Spoke with pt's wife (per DPR) who stated Augmintin was never filled when pt was D/C'd for ED on 07/04/22. Attempted to call [facility] 4 times and was disconnected every time. Judson Roch would you be will to send in med for pt? Please advise?

## 2022-07-08 NOTE — TOC Transition Note (Signed)
Transition of Care [facility]) - CM/SW Discharge Note   Patient Details  Name: Robert [name]. MRN: [id number] Date of Birth: 03-Aug-1929  Transition of [facility]) CM/SW Contact:  Vassie Moselle, LCSW Phone Number: 07/08/2022, 10:42 AM   Clinical Narrative:    Met with pt and spouse at bedside and confirmed plan for home health PT/OT. Pt and wife currently reside in a townhome at [facility] Pt currently receives PT at their outpatient center and are agreeable to having home health arranged. Pt and spouse also agreeable to having BSC and home O2 ordered.  CSW spoke with Joellen Jersey at [facility] and confirmed pt's return to their ILF.  CSW spoke with Minna Merritts with [employer] and confirmed home health PT/OT for this pt. HH orders will need to be faxed to [phone].  O2 and BSC have been ordered through [employer] and will be delivered to pt's room prior to discharge.  Pt's wife plans to transport this pt home in her private vehicle and has assistance from staff at [facility] to assist with getting pt into the home.    Final next level of care: [place] Barriers to Discharge: Barriers Resolved   Patient Goals and CMS Choice Patient states their goals for this hospitalization and ongoing recovery are:: To return home CMS Medicare.gov Compare Post Acute Care list provided to:: Patient Choice offered to / list presented to : Patient, Spouse  Discharge Placement                Patient to be transferred to facility by: Spouse      Discharge Plan and Services In-house Referral: NA Discharge Planning Services: NA            DME Arranged: Bedside commode, Oxygen DME Agency: [facility] Date DME Agency Contacted: 07/08/22 Time DME Agency Contacted: [id number] Representative spoke with at DME Agency: [place]: PT, OT [place] Agency: Other - See comment Ellender Hose) Date [place]: 07/08/22 Time [place]: 1042 Representative spoke  with at [place]: [place] ([place]) Interventions     Readmission Risk Interventions    07/08/2022   10:41 AM 07/06/2022   12:45 PM  Readmission Risk Prevention Plan  Post Dischage Appt  Complete  Medication Screening  Complete  Transportation Screening Complete Complete  PCP or Specialist Appt within 5-7 Days Complete   Home Care Screening Complete   Medication Review (RN CM) Complete

## 2022-07-08 NOTE — Telephone Encounter (Signed)
Medication for ''augmentin'' could be called in by md hunsucker, as she can't get ahold of anyone at the hospital..  Pharmacy: [facility] [address].

## 2022-07-10 LAB — BODY FLUID CULTURE W GRAM STAIN: Culture: NO GROWTH

## 2022-07-11 ENCOUNTER — Other Ambulatory Visit: Payer: Self-pay | Admitting: Internal Medicine

## 2022-07-11 ENCOUNTER — Telehealth: Payer: Self-pay

## 2022-07-11 DIAGNOSIS — I482 Chronic atrial fibrillation, unspecified: Secondary | ICD-10-CM

## 2022-07-11 NOTE — Telephone Encounter (Signed)
Patient's wife called stating that she wanted to see about increasing his pain medication. She had left a voicemail on clinical intake line. When I called her back she stated that patient will be going to [employer].   Message routed to Dr. Veleta Miners

## 2022-07-11 NOTE — Telephone Encounter (Signed)
Handled in telephone encounter from 07/08/22 by Judson Roch. Nothing further needed at this time.

## 2022-07-11 NOTE — Telephone Encounter (Signed)
Prescription refill request for Xarelto received.  Indication:afib Last office visit:6/23 Weight:69.6 kg Age:88 Scr:1.5 CrCl:31.58  ml/min  Prescription refilled

## 2022-07-12 ENCOUNTER — Non-Acute Institutional Stay (SKILLED_NURSING_FACILITY): Payer: BC Managed Care – PPO | Admitting: Family Medicine

## 2022-07-12 DIAGNOSIS — J9601 Acute respiratory failure with hypoxia: Secondary | ICD-10-CM | POA: Diagnosis not present

## 2022-07-12 DIAGNOSIS — I5033 Acute on chronic diastolic (congestive) heart failure: Secondary | ICD-10-CM | POA: Diagnosis not present

## 2022-07-12 DIAGNOSIS — I482 Chronic atrial fibrillation, unspecified: Secondary | ICD-10-CM

## 2022-07-12 DIAGNOSIS — Z7901 Long term (current) use of anticoagulants: Secondary | ICD-10-CM

## 2022-07-12 DIAGNOSIS — E43 Unspecified severe protein-calorie malnutrition: Secondary | ICD-10-CM

## 2022-07-12 DIAGNOSIS — J471 Bronchiectasis with (acute) exacerbation: Secondary | ICD-10-CM

## 2022-07-12 DIAGNOSIS — R1319 Other dysphagia: Secondary | ICD-10-CM | POA: Insufficient documentation

## 2022-07-12 NOTE — Progress Notes (Signed)
Provider:  Alain Honey, MD Location:      Place of Service:     PCP: Robert Dad, MD Patient Care Team: Robert Dad, MD as PCP - General (Internal Medicine) Robert Lance, MD as PCP - Cardiology (Cardiology) Robert Lance, MD (Cardiology) Robert Apo, MD as Consulting Physician (Ophthalmology) Robert Doom, MD as Consulting Physician (Pulmonary Disease)  Extended Emergency Contact Information Primary Emergency Contact: Robert [name] Address: [address]          [place], [place] [address] Robert [name] of [place] Phone: [phone] Mobile Phone: [phone] Relation: Spouse Secondary Emergency Contact: [facility] [name] [place] Phone: [phone] Relation: Daughter Preferred language: Cleophus Molt Interpreter needed? No  Code Status:  Goals of Care: Advanced Directive information    07/04/2022    4:52 PM  Advanced Directives  Does Patient Have a Medical Advance Directive? No  Does patient want to make changes to medical advance directive? No - Patient declined  Would patient like information on creating a medical advance directive? No - Patient declined      No chief complaint on file.   HPI: Patient is a 88 y.o. male seen today for admission to [facility] for being hospitalized from October 30 to July 08, 2022.  Discharge diagnosis was aspiration pneumonia complicated by effusion with thoracentesis and chest tube insertion.  There is a long history of bronchiectasis followed by pulmonary.  On the day he was admitted to the hospital he presented to the pulmonology office and found to have a O2 sat of 82% on room air.  CT scan showed bronchiectasis and left-sided consolidation and effusion.  Patient had been on antibiotic 2 weeks prior to admission.  He also has a history of dysphagia which is worsening in recent months with him coughing with liquids as well as solids. In the hospital he was treated with vancomycin and cefepime.   Blood cultures were negative as were the sputum and effusion cultures.  Chest tube was placed on 11 1 but removed on 11 2 with 800 mils of effusion drained.  Repeat chest x-rays showed stability with some layering of the effusion.  At the time of discharge patient required 2 L of oxygen with mobility.  He was discharged with oxygen, desiring to go home.  He was given 7 additional days of Augmentin to complete 10 days of therapy for complicated pneumonia.  Since he has been home for the past 3 to 4 days he continues to decline with increasing confusion, more shortness of breath, and weakness.  Since they live in independent living [facility] it was decided to admit him to skilled care for nursing care as well as therapy with involvement of hospice. Other medical issues include paroxysmal A-fib, chronic kidney disease stage IIIb, chronic diastolic heart failure, and hypothyroidism.  He also has a pacemaker for symptomatic bradycardia and there are some cognitive changes as well.  Past Medical History:  Diagnosis Date   Abnormal CT of spine 10/24/2018   Per [facility] new patient packet   Atrial fibrillation ([place])    Back pain    Per [facility] new patient packet   Bradycardia    Per [facility] new patient packet   Bronchiectasis [facility])    Per [facility] new patient packet   COPD, mild ([place])    Per [place] new patient packet   Hx of colonoscopy 11/14/2007   Per [facility] new patient packet   Hx of CT scan of chest 08/23/2018  Per [place] new patient packet   Hypothyroidism    Lung cancer [facility])    Per [facility] new patient packet   Neck pain    Per [facility] new patient packet   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 His smoking use included cigarettes. He has a 40.00 pack-year smoking history. He has never used smokeless tobacco. He reports that he does not currently use alcohol after a past usage of about 7.0 standard drinks of alcohol per week. He reports that he does not use drugs. Social History   Socioeconomic History   Marital status: Married    Spouse name: Not on file   Number of children: 2   Years of education: Not on file   Highest education level: Not on file  Occupational History   Occupation: Retired  Tobacco Use   Smoking status: Former    Packs/day: 1.00    Years: 40.00    Total pack years: 40.00    Types: Cigarettes    Quit date: 09/05/1988    Years since quitting: 33.8   Smokeless tobacco: Never  Vaping Use   Vaping Use: Never used  Substance and Sexual Activity   Alcohol use: Not Currently    Alcohol/week: 7.0 standard drinks of alcohol    Types: 7 Standard drinks or equivalent per week    Comment: 1 drink daily   Drug use: No   Sexual activity: Not on file  Other Topics Concern   Not on file  Social History Narrative   Diet      Do you drink/eat things with caffeine Yes      Marital Status Married What year were you married? 1957      Do you live in a house, apartment, assisted living, condo, trailer, etc.? Apartment toenhome      Is it one or more stories? One      How many persons live in your home? 2         Do you have any pets in your home?(please list) No      Highest level of education completed: Bachelors degree      Current or past profession: Psychologist, sport and exercise      Do you exercise?: Some    Type and how often: Various      Do you have a Living Will? No      Do you have a DNR form?         If not, would you like to discuss one?       Do you  have signed POA/HPOA forms? No      Do you have difficulty bathing or dressing yourself? No      Do you have difficulty preparing food or eating? No      Do you have difficulty managing medications? No      Do you have difficulty managing your finances? No  Do you have difficulty affording your medications? No                  Social Determinants of Health   Financial Resource Strain: Not on file  Food Insecurity: No Food Insecurity (07/04/2022)   Hunger Vital Sign    Worried About Running Out of Food in the Last Year: Never true    Ran Out of Food in the Last Year: Never true  Transportation Needs: No Transportation Needs (07/04/2022)   PRAPARE - Hydrologist (Medical): No    Lack of Transportation (Non-Medical): No  Physical Activity: Not on file  Stress: Not on file  Social Connections: Not on file  Intimate Partner Violence: Not At Risk (07/04/2022)   Humiliation, Afraid, Rape, and Kick questionnaire    Fear of Current or Ex-Partner: No    Emotionally Abused: No    Physically Abused: No    Sexually Abused: No    Functional Status Survey:    Family History  Problem Relation Age of Onset   Arrhythmia Father        Atrial fibrillation   Diabetes Brother        Insulin dependent   Breast cancer Sister    Breast cancer Sister     Health Maintenance  Topic Date Due   Medicare Annual Wellness (AWV)  Never done   TETANUS/TDAP  Never done   Zoster Vaccines- Shingrix (1 of 2) Never done   Pneumonia Vaccine 66+ Years old (2 - PPSV23 or PCV20) 05/06/2019   INFLUENZA VACCINE  Completed   COVID-19 Vaccine  Completed   HPV VACCINES  Aged Out    No Known Allergies  Outpatient Encounter Medications as of 07/12/2022  Medication Sig   albuterol (PROVENTIL) (2.5 MG/3ML) 0.083% nebulizer solution USE 1 VIAL IN NEBULIZER EVERY 6 HOURS AS NEEDED FOR WHEEZING FOR SHORTNESS OF BREATH (Patient taking differently: Take 2.5 mg by nebulization  every 6 (six) hours as needed for wheezing or shortness of breath.)   albuterol (VENTOLIN HFA) 108 (90 Base) MCG/ACT inhaler Inhale 2 puffs into the lungs every 6 (six) hours as needed for wheezing or shortness of breath.   amoxicillin-clavulanate (AUGMENTIN) 875-125 MG tablet Take 1 tablet by mouth 2 (two) times daily.   Cholecalciferol (VITAMIN D) 50 MCG (2000 UT) tablet Take 2,000 Units by mouth daily.   docusate sodium (COLACE) 100 MG capsule Take 100 mg by mouth 2 (two) times daily.   ferrous sulfate 325 (65 FE) MG EC tablet Take 325 mg by mouth daily.   furosemide (LASIX) 40 MG tablet Take 1 tablet (40 mg total) by mouth daily.   guaiFENesin (MUCINEX) 600 MG 12 hr tablet Take 2 tablets (1,200 mg total) by mouth 2 (two) times daily.   HYDROcodone-acetaminophen (NORCO) 10-325 MG tablet 3 (three) times daily as needed.   levothyroxine (SYNTHROID) 125 MCG tablet TAKE 1 TABLET BY MOUTH EVERY DAY (Patient taking differently: Take 125 mcg by mouth daily before breakfast.)   Multiple Vitamin (MULTIVITAMIN) tablet Take 1 tablet by mouth daily.   psyllium (REGULOID) 0.52 g capsule Take 0.52 g by mouth daily. Fiber   SENNA CO 1-2 tablets by Combination route every evening.   TRELEGY ELLIPTA 100-62.5-25 MCG/ACT AEPB Inhale 1 puff into the lungs daily.   vitamin C (ASCORBIC ACID) 500 MG tablet Take 500 mg by mouth daily.   XARELTO 15 MG TABS tablet TAKE 1 TABLET BY MOUTH EVERY DAY WITH SUPPER  No facility-administered encounter medications on file as of 07/12/2022.    Review of Systems  Constitutional:  Positive for activity change and unexpected weight change.  Respiratory:  Positive for cough and shortness of breath.   Cardiovascular:  Positive for chest pain.       Pain seem to be related to the effusion and was left-sided  Gastrointestinal:  Positive for constipation.  Musculoskeletal:  Positive for back pain.  Skin:  Positive for pallor.  Neurological:  Positive for weakness.   Hematological: Negative.   Psychiatric/Behavioral:  Positive for confusion and decreased concentration.     There were no vitals filed for this visit. There is no height or weight on file to calculate BMI. Physical Exam Vitals and nursing note reviewed.  Constitutional:      Appearance: Normal appearance.  HENT:     Mouth/Throat:     Mouth: Mucous membranes are moist.     Pharynx: Oropharynx is clear.  Cardiovascular:     Rate and Rhythm: Normal rate and regular rhythm.  Pulmonary:     Effort: Pulmonary effort is normal.     Breath sounds: Rhonchi present.     Comments: Decreased breath sounds as well as dullness to percussion left lower lobe Chest:     Chest wall: Tenderness present.  Abdominal:     General: Bowel sounds are normal.     Palpations: Abdomen is soft.  Musculoskeletal:        General: Normal range of motion.  Neurological:     General: No focal deficit present.     Mental Status: He is alert.     Comments: Oriented to self and place  Psychiatric:        Mood and Affect: Mood normal.        Behavior: Behavior normal.     Labs reviewed: Basic Metabolic Panel: Recent Labs    07/04/22 1406 07/05/22 0519 07/06/22 1219 07/07/22 0522  NA 137 140  --  141  K 3.8 3.9  --  3.8  CL 102 106  --  103  CO2 24 24  --  29  GLUCOSE 120* 139*  --  139*  BUN 37* 44*  --  48*  CREATININE 1.90* 1.92* 1.56* 1.59*  CALCIUM 9.0 9.0  --  9.2  MG  --   --   --  2.4  PHOS  --   --   --  3.5   Liver Function Tests: Recent Labs    06/09/22 0713 07/04/22 1406 07/05/22 0519  AST 24 27 34  ALT 14 17 19   ALKPHOS  --  63 60  BILITOT 1.0 1.5* 1.0  PROT 7.5 8.0 7.7  ALBUMIN  --  3.4* 3.2*   No results for input(s): "LIPASE", "AMYLASE" in the last 8760 hours. No results for input(s): "AMMONIA" in the last 8760 hours. CBC: Recent Labs    06/09/22 0713 07/04/22 1406 07/05/22 0519 07/07/22 0522  WBC 4.9 9.0 14.2* 10.3  NEUTROABS 2,362 7.9*  --  7.7  HGB 10.7*  11.3* 11.2* 12.0*  HCT 31.4* 35.0* 35.2* 37.5*  MCV 97.8 103.6* 104.5* 103.0*  PLT 119* 178 181 219   Cardiac Enzymes: No results for input(s): "CKTOTAL", "CKMB", "CKMBINDEX", "TROPONINI" in the last 8760 hours. BNP: Invalid input(s): "POCBNP" No results found for: "HGBA1C" Lab Results  Component Value Date   TSH 1.35 06/09/2022   Lab Results  Component Value Date   VITAMINB12 1,112 (H) 07/04/2022   Lab Results  Component Value Date   FOLATE 33.5 07/04/2022   No results found for: "IRON", "TIBC", "FERRITIN"  Imaging and Procedures obtained prior to SNF admission: DG ESOPHAGUS W SINGLE CM (SOL OR THIN BA)  Result Date: 07/05/2022 CLINICAL DATA:  Dysphagia EXAM: ESOPHOGRAM/BARIUM SWALLOW TECHNIQUE: Single contrast examination was performed using  thin barium. FLUOROSCOPY: Radiation Exposure Index (as provided by the fluoroscopic device): 3.6 mGy Kerma COMPARISON:  None Available. FINDINGS: Limited upright LPO single contrast esophagram. There is tortuosity of the upper thoracic esophagus. There is severe esophageal dysmotility. There is pooling of contrast in the proximal esophagus with significant delayed passage into the distal esophagus and stomach. No aspiration occurred during the exam. A 13 mm barium tablet was not given. Postoperative changes of the right lower neck and upper right ribs noted. IMPRESSION: Severe esophageal dysmotility. Electronically Signed   By: Maurine Simmering M.D.   On: 07/05/2022 15:34   DG CHEST PORT 1 VIEW  Result Date: 07/05/2022 CLINICAL DATA:  Provided history: Status post thoracentesis. EXAM: PORTABLE CHEST 1 VIEW COMPARISON:  Prior chest radiographs 07/04/2022 and earlier. Chest CT 07/04/2022. FINDINGS: Left chest dual lead pacer device. Cardiomegaly, unchanged. Aortic atherosclerosis. Persistent small left pleural effusion, decreased in size from the prior examination of 07/04/2022. Associated left basilar atelectasis and/or consolidation. Focus of  chronic consolidation within the right middle lobe, more fully characterized on yesterday's chest CT. No evidence of pneumothorax. No acute bony abnormality identified. IMPRESSION: No evidence of pneumothorax status post thoracentesis. Small left pleural effusion, decreased in size. Associated left basilar atelectasis and/or consolidation. Focus of chronic consolidation within the right middle lobe, more fully characterized on yesterday's chest CT. Please refer to this prior examination for further description. Cardiomegaly. Aortic Atherosclerosis (ICD10-I70.0). Electronically Signed   By: Kellie Simmering D.O.   On: 07/05/2022 11:21   ECHOCARDIOGRAM COMPLETE  Result Date: 07/05/2022    ECHOCARDIOGRAM REPORT   Patient Name:   Robert [name]. Date of Exam: 07/05/2022 Medical Rec #:  [id number]           Height:       72.0 in Accession #:    [id number]          Weight:       163.4 lb Date of Birth:  July 10, 1929           BSA:          1.955 m Patient Age:    74 years            BP:           120/70 mmHg Patient Gender: M                   HR:           66 bpm. Exam Location:  Inpatient Procedure: 2D Echo                                 MODIFIED REPORT: This report was modified by Rudean Haskell MD on 07/05/2022 due to comment                           on study quality/difficulty.  Indications:     CHF  History:         Patient has no prior history of Echocardiogram examinations.  CHF, COPD, Arrythmias:Atrial Fibrillation; Risk                  Factors:Dyslipidemia.  Sonographer:     Harvie Junior Referring Phys:  [id number] Jonnie Finner Diagnosing Phys: Rudean Haskell MD  Sonographer Comments: Technically difficult study due to poor echo windows. Image acquisition challenging due to respiratory motion and Image acquisition challenging due to COPD. IMPRESSIONS  1. Left ventricular ejection fraction, by estimation, is 50 to 55%. The left ventricle has low normal function. The left ventricle  has no regional wall motion abnormalities. Left ventricular diastolic parameters are indeterminate.  2. Right ventricular systolic function is normal. The right ventricular size is normal. A device lead not well visualized in this study is visualized. There is normal pulmonary artery systolic pressure.  3. Left atrial size was mildly dilated.  4. The mitral valve is normal in structure. No evidence of mitral valve regurgitation. No evidence of mitral stenosis.  5. The aortic valve was not well visualized. Aortic valve regurgitation is not visualized. No aortic stenosis is present.  6. Technically difficult study. Comparison(s): No prior Echocardiogram. FINDINGS  Left Ventricle: Left ventricular ejection fraction, by estimation, is 50 to 55%. The left ventricle has low normal function. The left ventricle has no regional wall motion abnormalities. The left ventricular internal cavity size was normal in size. There is no left ventricular hypertrophy. Left ventricular diastolic parameters are indeterminate. Right Ventricle: The right ventricular size is normal. No increase in right ventricular wall thickness. Right ventricular systolic function is normal. There is normal pulmonary artery systolic pressure. The tricuspid regurgitant velocity is 2.39 m/s, and  with an assumed right atrial pressure of 3 mmHg, the estimated right ventricular systolic pressure is 74.9 mmHg. Left Atrium: Left atrial size was mildly dilated. Right Atrium: Right atrial size was normal in size. Pericardium: There is no evidence of pericardial effusion. Mitral Valve: The mitral valve is normal in structure. No evidence of mitral valve regurgitation. No evidence of mitral valve stenosis. Tricuspid Valve: The tricuspid valve is normal in structure. Tricuspid valve regurgitation is not demonstrated. No evidence of tricuspid stenosis. Aortic Valve: The aortic valve was not well visualized. Aortic valve regurgitation is not visualized. No aortic  stenosis is present. Aortic valve mean gradient measures 4.0 mmHg. Aortic valve peak gradient measures 6.9 mmHg. Aortic valve area, by VTI measures 2.96 cm. Pulmonic Valve: The pulmonic valve was not well visualized. Pulmonic valve regurgitation is not visualized. Aorta: The aortic root and ascending aorta are structurally normal, with no evidence of dilitation. IAS/Shunts: No atrial level shunt detected by color flow Doppler. Additional Comments: A device lead not well visualized in this study is visualized.  LEFT VENTRICLE PLAX 2D LVIDd:         4.10 cm     Diastology LVIDs:         2.80 cm     LV e' medial:    6.53 cm/s LV PW:         0.80 cm     LV E/e' medial:  15.8 LV IVS:        0.80 cm     LV e' lateral:   7.40 cm/s LVOT diam:     2.10 cm     LV E/e' lateral: 13.9 LV SV:         63 LV SV Index:   32 LVOT Area:     3.46 cm  LV Volumes (MOD) LV vol d, MOD A2C: 68.5  ml LV vol d, MOD A4C: 94.8 ml LV vol s, MOD A2C: 28.0 ml LV vol s, MOD A4C: 44.7 ml LV SV MOD A2C:     40.5 ml LV SV MOD A4C:     94.8 ml LV SV MOD BP:      45.3 ml RIGHT VENTRICLE RV Basal diam:  3.30 cm RV Mid diam:    2.70 cm TAPSE (M-mode): 2.4 cm LEFT ATRIUM             Index        RIGHT ATRIUM           Index LA diam:        2.90 cm 1.48 cm/m   RA Area:     31.50 cm LA Vol (A2C):   74.2 ml 37.95 ml/m  RA Volume:   119.00 ml 60.87 ml/m LA Vol (A4C):   66.4 ml 33.96 ml/m LA Biplane Vol: 71.6 ml 36.62 ml/m  AORTIC VALVE                    PULMONIC VALVE AV Area (Vmax):    2.16 cm     PV Vmax:       0.87 m/s AV Area (Vmean):   2.41 cm     PV Peak grad:  3.0 mmHg AV Area (VTI):     2.96 cm AV Vmax:           131.00 cm/s AV Vmean:          89.400 cm/s AV VTI:            0.214 m AV Peak Grad:      6.9 mmHg AV Mean Grad:      4.0 mmHg LVOT Vmax:         81.60 cm/s LVOT Vmean:        62.200 cm/s LVOT VTI:          0.183 m LVOT/AV VTI ratio: 0.86  AORTA Ao Root diam: 3.40 cm Ao Asc diam:  2.70 cm MITRAL VALVE                TRICUSPID VALVE  MV Area (PHT): 4.60 cm     TR Peak grad:   22.8 mmHg MV Decel Time: 165 msec     TR Vmax:        239.00 cm/s MR Peak grad: 28.1 mmHg MR Vmax:      265.00 cm/s   SHUNTS MV E velocity: 103.00 cm/s  Systemic VTI:  0.18 m MV A velocity: 38.10 cm/s   Systemic Diam: 2.10 cm MV E/A ratio:  2.70 Rudean Haskell MD Electronically signed by Rudean Haskell MD Signature Date/Time: 07/05/2022/11:10:30 AM    Final (Updated)    US RENAL  Result Date: 07/04/2022 CLINICAL DATA:  64 year old with acute kidney injury. EXAM: RENAL / URINARY TRACT ULTRASOUND COMPLETE COMPARISON:  None Available. FINDINGS: Right Kidney: Renal measurements: 10.6 x 4.2 x 4.7 cm = volume: 107 mL. Normal parenchymal echogenicity. No hydronephrosis. No visualized stone or focal lesion. Left Kidney: Renal measurements: 10.3 x 4.9 x 5 cm = volume: 132 mL. Normal parenchymal echogenicity. No hydronephrosis. No visualized stone or focal lesion. Bladder: Appears normal for degree of bladder distention. Other: None. IMPRESSION: No obstructive uropathy. No acute findings or explanation for acute kidney injury. Electronically Signed   By: Keith Rake M.D.   On: 07/04/2022 21:10   CT Chest Wo Contrast  Result Date: 07/04/2022 CLINICAL DATA:  Pneumonia,  complication suspected, xray done EXAM: CT CHEST WITHOUT CONTRAST TECHNIQUE: Multidetector CT imaging of the chest was performed following the standard protocol without IV contrast. RADIATION DOSE REDUCTION: This exam was performed according to the departmental dose-optimization program which includes automated exposure control, adjustment of the mA and/or kV according to patient size and/or use of iterative reconstruction technique. COMPARISON:  Radiograph earlier today. PET CT 10/25/2021, chest CT 09/10/2021 FINDINGS: Cardiovascular: Left-sided pacemaker in place with lead tips projecting over the right atrium and ventricle. Multi chamber cardiomegaly. Prominent main pulmonary artery at 3.3  cm. Small amount of fluid in the superior pericardial recess without significant pericardial effusion. Aortic atherosclerosis and tortuosity. No periaortic stranding. Mediastinum/Nodes: 14 mm subcarinal node series 2, image 66, previously 20 mm. 15 mm low right infrahilar node, series 2, image 84, unchanged. Additional scattered small mediastinal lymph nodes that are not enlarged by size criteria in similar to prior exam. Assessment for hilar adenopathy is significantly limited in the absence of IV contrast. Patulous esophagus with retained debris. Lungs/Pleura: Moderate size left pleural effusion has increased in size from prior imaging. Small amount of fluid tracks in the inter lobar fissure. Near complete consolidation of the left lower lobe. Postsurgical volume loss in the right hemithorax. Chronic area of consolidation in the right middle lobe with air bronchograms, unchanged in appearance from prior imaging. Areas of ground-glass opacity in septal thickening in the right lower lobe, also chronic and similar in appearance to prior exam. Upper Abdomen: No acute upper abdominal findings. Musculoskeletal: Chronic C7 compression fracture. Chronic T7 compression fracture. Postsurgical change of the right upper hemithorax. Thoracic spondylosis with spurring. Pectus excavatum deformity. No focal bone lesions. The bones are diffusely under mineralized. IMPRESSION: 1. Moderate size left pleural effusion has increased in size from prior imaging. Near complete consolidation of the left lower lobe, favor pneumonia over compressive atelectasis. 2. Chronic area of consolidation in the right middle lobe with air bronchograms, unchanged in appearance from prior imaging. Scarring is favored. 3. Areas of ground-glass opacity and septal thickening in the right lower lobe are also chronic and similar in appearance to prior imaging. 4. Cardiomegaly. Prominent main pulmonary artery suggesting pulmonary arterial hypertension. 5.  Patulous esophagus with retained debris. Aortic Atherosclerosis (ICD10-I70.0). Electronically Signed   By: Keith Rake M.D.   On: 07/04/2022 15:36   DG Chest Port 1 View  Result Date: 07/04/2022 CLINICAL DATA:  Shortness of breath EXAM: PORTABLE CHEST 1 VIEW COMPARISON:  10/25/2021, 08/12/2021 FINDINGS: Left-sided implanted cardiac device remains in place. Cardiomegaly. Aortic atherosclerosis. Moderate-sized left-sided pleural effusion with associated left basilar opacity. No pneumothorax. Postsurgical changes of the upper right hemithorax. IMPRESSION: New moderate-sized left-sided pleural effusion with associated left basilar opacity. Electronically Signed   By: Davina Poke D.O.   On: 07/04/2022 13:11    Assessment/Plan 1. Acute on chronic diastolic CHF (congestive heart failure) ([place]) Compensated at this time  2. Acute respiratory failure with hypoxia ([facility]) Related to acute aspiration pneumonia.  We will attempt to deal with his dysphagia and aspiration issues here.  3. Chronic atrial fibrillation (HCC) Takes Xarelto.  Has pacemaker  4. Bronchiectasis with (acute) exacerbation ([place]) Patient is declining due to chronic bronchiectasis.  Dysphagia with aspiration superimposed at family request will consult with hospice.  Have added oxycodone to help with his chronic pain but this should also help with his breathing  5. Chronic anticoagulation History of A-fib  6. Protein-calorie malnutrition, severe Oral intake has been declining.  Weight has declined with  intake.  Would probably benefit from a dysphagia type diet  7. Dysphagia causing pulmonary aspiration with swallowing Per recommendations of ST    Family/ staff Communication:   Labs/tests ordered:  Lillette Boxer. Sabra Heck, [place] [address] [place], [place] Office [phone]

## 2022-07-15 ENCOUNTER — Encounter: Payer: Self-pay | Admitting: Nurse Practitioner

## 2022-07-15 ENCOUNTER — Non-Acute Institutional Stay (SKILLED_NURSING_FACILITY): Payer: BC Managed Care – PPO | Admitting: Nurse Practitioner

## 2022-07-15 DIAGNOSIS — J449 Chronic obstructive pulmonary disease, unspecified: Secondary | ICD-10-CM

## 2022-07-15 DIAGNOSIS — J9601 Acute respiratory failure with hypoxia: Secondary | ICD-10-CM

## 2022-07-15 NOTE — Progress Notes (Unsigned)
Location:  [facility] Room Number: no/06/A Place of Service:  SNF (31) Provider:  Garyn Waguespack X, NP Mahlon Gammon, MD  Patient Care Team: Mahlon Gammon, MD as PCP - General (Internal Medicine) Marinus Maw, MD as PCP - Cardiology (Cardiology) Marinus Maw, MD (Cardiology) Antony Contras, MD as Consulting Physician (Ophthalmology) Lupita Leash, MD as Consulting Physician (Pulmonary Disease)  Extended Emergency Contact Information Primary Emergency Contact: Quitman Livings Address: [address]          [place], [place] [address] [place] of [place] Home Phone: [phone] Mobile Phone: [phone] Relation: Spouse Secondary Emergency Contact: [facility] Phone: [phone] Relation: Daughter Preferred language: English Interpreter needed? No  Code Status:  DNR Goals of care: Advanced Directive information    07/15/2022   11:40 AM  Advanced Directives  Does Patient Have a Medical Advance Directive? Yes  Type of Estate agent of [place];Out of facility DNR (pink MOST or yellow form)  Does patient want to make changes to medical advance directive? No - Patient declined  Copy of Healthcare Power of Attorney in Chart? No - copy requested     Chief Complaint  Patient presents with   Acute Visit    Patient is here for end of life care    HPI:  Pt is a 88 y.o. male seen today for an acute visit for    Past Medical History:  Diagnosis Date   Abnormal CT of spine 10/24/2018   Per [facility] new patient packet   Atrial fibrillation (HCC)    Back pain    Per [facility] new patient packet   Bradycardia    Per [facility] new patient packet   Bronchiectasis [facility]    Per [facility] new patient packet   COPD, mild (HCC)    Per [facility] new patient packet   Hx of colonoscopy 11/14/2007   Per [facility] new patient packet   Hx of CT scan of chest 08/23/2018   Per [facility] new patient packet   Hypothyroidism    Lung cancer [facility])     Per [facility] new patient packet   Neck pain    Per [facility] new patient packet   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   Pneumonia 2015   Symptomatic bradycardia    s/p PPM 1995   Past Surgical History:  Procedure Laterality Date   CATARACT EXTRACTION, BILATERAL     COLONOSCOPY  11/14/2007   Per [facility] new patient packet   LUNG REMOVAL, PARTIAL     PACEMAKER INSERTION  1995   PPM GENERATOR CHANGEOUT N/A 07/23/2018   Procedure: PPM GENERATOR CHANGEOUT;  Surgeon: Marinus Maw, MD;  Location: [facility] INVASIVE CV LAB;  Service: Cardiovascular;  Laterality: N/A;    No Known Allergies  Outpatient Encounter Medications as of 07/15/2022  Medication Sig   albuterol (PROVENTIL) (2.5 MG/3ML) 0.083% nebulizer solution USE 1 VIAL IN NEBULIZER EVERY 6 HOURS AS NEEDED FOR WHEEZING FOR SHORTNESS OF BREATH (Patient taking differently: Take 2.5 mg by nebulization every 6 (six) hours as needed for wheezing or shortness of breath.)   albuterol (VENTOLIN HFA) 108 (90 Base) MCG/ACT inhaler Inhale 2 puffs into the lungs every 6 (six) hours as needed for wheezing or shortness of breath.   TRELEGY ELLIPTA 100-62.5-25 MCG/ACT AEPB Inhale 1 puff into the lungs daily.   No facility-administered encounter medications on file as of 07/15/2022.    Review of Systems  Immunization History  Administered Date(s) Administered   Fluad Quad(high Dose 65+) 06/21/2022   Influenza Split 06/06/2017   Influenza, High Dose Seasonal PF 07/06/2019, 06/17/2020, 06/21/2021   Influenza,inj,quad, With Preservative 06/05/2018   PFIZER(Purple Top)SARS-COV-2 Vaccination 09/29/2019, 10/21/2019, 08/13/2020   Pfizer Covid-19 Vaccine Bivalent Booster 76yrs & up 01/18/2021, 08/05/2021, 05/29/2022   Pneumococcal Conjugate-13 06/15/2016   Pneumococcal-Unspecified 03/11/2019   Pertinent  Health Maintenance Due  Topic Date Due   INFLUENZA VACCINE  Completed      07/06/2022   12:00 PM 07/06/2022    9:40 PM 07/07/2022   10:00  AM 07/07/2022    9:46 PM 07/08/2022    7:35 AM  Fall Risk  Patient Fall Risk Level High fall risk High fall risk High fall risk High fall risk High fall risk   Functional Status Survey:    Vitals:   07/15/22 1254  BP: 112/65  Pulse: 77  Resp: 20  Temp: (!) 97.2 F (36.2 C)  SpO2: 95%  Weight: 139 lb (63 kg)  Height: 6' (1.829 m)   Body mass index is 18.85 kg/m. Physical Exam  Labs reviewed: Recent Labs    07/04/22 1406 07/05/22 0519 07/06/22 1219 07/07/22 0522  NA 137 140  --  141  K 3.8 3.9  --  3.8  CL 102 106  --  103  CO2 24 24  --  29  GLUCOSE 120* 139*  --  139*  BUN 37* 44*  --  48*  CREATININE 1.90* 1.92* 1.56* 1.59*  CALCIUM 9.0 9.0  --  9.2  MG  --   --   --  2.4  PHOS  --   --   --  3.5   Recent Labs    06/09/22 0713 07/04/22 1406 07/05/22 0519  AST 24 27 34  ALT 14 17 19   ALKPHOS  --  63 60  BILITOT 1.0 1.5* 1.0  PROT 7.5 8.0 7.7  ALBUMIN  --  3.4* 3.2*   Recent Labs    06/09/22 0713 07/04/22 1406 07/05/22 0519 07/07/22 0522  WBC 4.9 9.0 14.2* 10.3  NEUTROABS 2,362 7.9*  --  7.7  HGB 10.7* 11.3* 11.2* 12.0*  HCT 31.4* 35.0* 35.2* 37.5*  MCV 97.8 103.6* 104.5* 103.0*  PLT 119* 178 181 219   Lab Results  Component Value Date   TSH 1.35 06/09/2022   No results found for: "HGBA1C" Lab Results  Component Value Date   CHOL 144 08/11/2020   HDL 59 08/11/2020   LDLCALC 69 08/11/2020   TRIG 78 08/11/2020   CHOLHDL 2.4 08/11/2020    Significant Diagnostic Results in last 30 days:  DG Chest 1 View  Result Date: 07/07/2022 CLINICAL DATA:  Status post chest tube removal EXAM: PORTABLE CHEST 1 VIEW COMPARISON:  Film from earlier in the same day. FINDINGS: Left-sided pigtail catheter has been removed in the interval. Cardiac shadow remains enlarged. Pacing device is unchanged. Small left effusion and residual left basilar atelectasis are seen. Postsurgical changes are noted in the right apex stable from the prior exam. No recurrent  left-sided pneumothorax is seen. IMPRESSION: Persistent left basilar atelectasis and effusion. No pneumothorax is noted following chest tube removal. Electronically Signed   By: Alcide Clever M.D.   On: 07/07/2022 11:50   DG Chest 1 View  Result Date: 07/07/2022 CLINICAL DATA:  Chest tube EXAM: CHEST  1 VIEW COMPARISON:  Chest 07/06/2022 FINDINGS: Pigtail chest tube in the left lung base unchanged. Small left basilar pneumothorax has resolved. No  residual pneumothorax identified. Left lower lobe atelectasis and small effusion unchanged Cardiac enlargement.  Dual lead pacemaker unchanged. Postsurgical changes right upper lobe with resection of upper ribs. Lung appears to be protruding through the defect into the right lower neck unchanged. Right lung is clear. IMPRESSION: 1. Resolution of left pneumothorax. Left chest tube remains in place. 2. Left lower lobe atelectasis and small effusion unchanged. Electronically Signed   By: Marlan Palau M.D.   On: 07/07/2022 08:30   DG Chest 1 View  Result Date: 07/06/2022 CLINICAL DATA:  Left chest tube placement EXAM: CHEST  1 VIEW COMPARISON:  Chest x-ray 07/04/2022 FINDINGS: Very small left hydropneumothorax present inferiorly. New left pleural drainage catheter is noted inferiorly. Left pleural effusion has nearly completely resolved. There is left basilar atelectasis. Postsurgical changes in the right lung apex appear unchanged. Cardiac silhouette is enlarged, unchanged. Left-sided pacemaker is stable in position. No acute fractures. IMPRESSION: Small inferior left hydropneumothorax following left pleural drainage catheter placement. Left pleural effusion has nearly completely resolved. Electronically Signed   By: Darliss Cheney M.D.   On: 07/06/2022 18:11   DG ESOPHAGUS W SINGLE CM (SOL OR THIN BA)  Result Date: 07/05/2022 CLINICAL DATA:  Dysphagia EXAM: ESOPHOGRAM/BARIUM SWALLOW TECHNIQUE: Single contrast examination was performed using  thin barium.  FLUOROSCOPY: Radiation Exposure Index (as provided by the fluoroscopic device): 3.6 mGy Kerma COMPARISON:  None Available. FINDINGS: Limited upright LPO single contrast esophagram. There is tortuosity of the upper thoracic esophagus. There is severe esophageal dysmotility. There is pooling of contrast in the proximal esophagus with significant delayed passage into the distal esophagus and stomach. No aspiration occurred during the exam. A 13 mm barium tablet was not given. Postoperative changes of the right lower neck and upper right ribs noted. IMPRESSION: Severe esophageal dysmotility. Electronically Signed   By: Caprice Renshaw M.D.   On: 07/05/2022 15:34   DG CHEST PORT 1 VIEW  Result Date: 07/05/2022 CLINICAL DATA:  Provided history: Status post thoracentesis. EXAM: PORTABLE CHEST 1 VIEW COMPARISON:  Prior chest radiographs 07/04/2022 and earlier. Chest CT 07/04/2022. FINDINGS: Left chest dual lead pacer device. Cardiomegaly, unchanged. Aortic atherosclerosis. Persistent small left pleural effusion, decreased in size from the prior examination of 07/04/2022. Associated left basilar atelectasis and/or consolidation. Focus of chronic consolidation within the right middle lobe, more fully characterized on yesterday's chest CT. No evidence of pneumothorax. No acute bony abnormality identified. IMPRESSION: No evidence of pneumothorax status post thoracentesis. Small left pleural effusion, decreased in size. Associated left basilar atelectasis and/or consolidation. Focus of chronic consolidation within the right middle lobe, more fully characterized on yesterday's chest CT. Please refer to this prior examination for further description. Cardiomegaly. Aortic Atherosclerosis (ICD10-I70.0). Electronically Signed   By: Jackey Loge D.O.   On: 07/05/2022 11:21   ECHOCARDIOGRAM COMPLETE  Result Date: 07/05/2022    ECHOCARDIOGRAM REPORT   Patient Name:   Robert [name]. Date of Exam: 07/05/2022 Medical Rec #:   [id number]           Height:       72.0 in Accession #:    [id number]          Weight:       163.4 lb Date of Birth:  [birth year]           BSA:          1.955 m Patient Age:    89 years            BP:  120/70 mmHg Patient Gender: M                   HR:           66 bpm. Exam Location:  Inpatient Procedure: 2D Echo                                 MODIFIED REPORT: This report was modified by Riley Lam MD on 07/05/2022 due to comment                           on study quality/difficulty.  Indications:     CHF  History:         Patient has no prior history of Echocardiogram examinations.                  CHF, COPD, Arrythmias:Atrial Fibrillation; Risk                  Factors:Dyslipidemia.  Sonographer:     Cathie Hoops Referring Phys:  [id number] Teddy Spike Diagnosing Phys: Riley Lam MD  Sonographer Comments: Technically difficult study due to poor echo windows. Image acquisition challenging due to respiratory motion and Image acquisition challenging due to COPD. IMPRESSIONS  1. Left ventricular ejection fraction, by estimation, is 50 to 55%. The left ventricle has low normal function. The left ventricle has no regional wall motion abnormalities. Left ventricular diastolic parameters are indeterminate.  2. Right ventricular systolic function is normal. The right ventricular size is normal. A device lead not well visualized in this study is visualized. There is normal pulmonary artery systolic pressure.  3. Left atrial size was mildly dilated.  4. The mitral valve is normal in structure. No evidence of mitral valve regurgitation. No evidence of mitral stenosis.  5. The aortic valve was not well visualized. Aortic valve regurgitation is not visualized. No aortic stenosis is present.  6. Technically difficult study. Comparison(s): No prior Echocardiogram. FINDINGS  Left Ventricle: Left ventricular ejection fraction, by estimation, is 50 to 55%. The left ventricle has low normal function. The  left ventricle has no regional wall motion abnormalities. The left ventricular internal cavity size was normal in size. There is no left ventricular hypertrophy. Left ventricular diastolic parameters are indeterminate. Right Ventricle: The right ventricular size is normal. No increase in right ventricular wall thickness. Right ventricular systolic function is normal. There is normal pulmonary artery systolic pressure. The tricuspid regurgitant velocity is 2.39 m/s, and  with an assumed right atrial pressure of 3 mmHg, the estimated right ventricular systolic pressure is 25.8 mmHg. Left Atrium: Left atrial size was mildly dilated. Right Atrium: Right atrial size was normal in size. Pericardium: There is no evidence of pericardial effusion. Mitral Valve: The mitral valve is normal in structure. No evidence of mitral valve regurgitation. No evidence of mitral valve stenosis. Tricuspid Valve: The tricuspid valve is normal in structure. Tricuspid valve regurgitation is not demonstrated. No evidence of tricuspid stenosis. Aortic Valve: The aortic valve was not well visualized. Aortic valve regurgitation is not visualized. No aortic stenosis is present. Aortic valve mean gradient measures 4.0 mmHg. Aortic valve peak gradient measures 6.9 mmHg. Aortic valve area, by VTI measures 2.96 cm. Pulmonic Valve: The pulmonic valve was not well visualized. Pulmonic valve regurgitation is not visualized. Aorta: The aortic root and ascending aorta are structurally normal, with no evidence of dilitation. IAS/Shunts:  No atrial level shunt detected by color flow Doppler. Additional Comments: A device lead not well visualized in this study is visualized.  LEFT VENTRICLE PLAX 2D LVIDd:         4.10 cm     Diastology LVIDs:         2.80 cm     LV e' medial:    6.53 cm/s LV PW:         0.80 cm     LV E/e' medial:  15.8 LV IVS:        0.80 cm     LV e' lateral:   7.40 cm/s LVOT diam:     2.10 cm     LV E/e' lateral: 13.9 LV SV:         63 LV  SV Index:   32 LVOT Area:     3.46 cm  LV Volumes (MOD) LV vol d, MOD A2C: 68.5 ml LV vol d, MOD A4C: 94.8 ml LV vol s, MOD A2C: 28.0 ml LV vol s, MOD A4C: 44.7 ml LV SV MOD A2C:     40.5 ml LV SV MOD A4C:     94.8 ml LV SV MOD BP:      45.3 ml RIGHT VENTRICLE RV Basal diam:  3.30 cm RV Mid diam:    2.70 cm TAPSE (M-mode): 2.4 cm LEFT ATRIUM             Index        RIGHT ATRIUM           Index LA diam:        2.90 cm 1.48 cm/m   RA Area:     31.50 cm LA Vol (A2C):   74.2 ml 37.95 ml/m  RA Volume:   119.00 ml 60.87 ml/m LA Vol (A4C):   66.4 ml 33.96 ml/m LA Biplane Vol: 71.6 ml 36.62 ml/m  AORTIC VALVE                    PULMONIC VALVE AV Area (Vmax):    2.16 cm     PV Vmax:       0.87 m/s AV Area (Vmean):   2.41 cm     PV Peak grad:  3.0 mmHg AV Area (VTI):     2.96 cm AV Vmax:           131.00 cm/s AV Vmean:          89.400 cm/s AV VTI:            0.214 m AV Peak Grad:      6.9 mmHg AV Mean Grad:      4.0 mmHg LVOT Vmax:         81.60 cm/s LVOT Vmean:        62.200 cm/s LVOT VTI:          0.183 m LVOT/AV VTI ratio: 0.86  AORTA Ao Root diam: 3.40 cm Ao Asc diam:  2.70 cm MITRAL VALVE                TRICUSPID VALVE MV Area (PHT): 4.60 cm     TR Peak grad:   22.8 mmHg MV Decel Time: 165 msec     TR Vmax:        239.00 cm/s MR Peak grad: 28.1 mmHg MR Vmax:      265.00 cm/s   SHUNTS MV E velocity: 103.00 cm/s  Systemic VTI:  0.18 m MV A velocity: 38.10  cm/s   Systemic Diam: 2.10 cm MV E/A ratio:  2.70 Riley Lam MD Electronically signed by Riley Lam MD Signature Date/Time: 07/05/2022/11:10:30 AM    Final (Updated)    US RENAL  Result Date: 07/04/2022 CLINICAL DATA:  61 year old with acute kidney injury. EXAM: RENAL / URINARY TRACT ULTRASOUND COMPLETE COMPARISON:  None Available. FINDINGS: Right Kidney: Renal measurements: 10.6 x 4.2 x 4.7 cm = volume: 107 mL. Normal parenchymal echogenicity. No hydronephrosis. No visualized stone or focal lesion. Left Kidney: Renal measurements:  10.3 x 4.9 x 5 cm = volume: 132 mL. Normal parenchymal echogenicity. No hydronephrosis. No visualized stone or focal lesion. Bladder: Appears normal for degree of bladder distention. Other: None. IMPRESSION: No obstructive uropathy. No acute findings or explanation for acute kidney injury. Electronically Signed   By: Narda Rutherford M.D.   On: 07/04/2022 21:10   CT Chest Wo Contrast  Result Date: 07/04/2022 CLINICAL DATA:  Pneumonia, complication suspected, xray done EXAM: CT CHEST WITHOUT CONTRAST TECHNIQUE: Multidetector CT imaging of the chest was performed following the standard protocol without IV contrast. RADIATION DOSE REDUCTION: This exam was performed according to the departmental dose-optimization program which includes automated exposure control, adjustment of the mA and/or kV according to patient size and/or use of iterative reconstruction technique. COMPARISON:  Radiograph earlier today. PET CT 10/25/2021, chest CT 09/10/2021 FINDINGS: Cardiovascular: Left-sided pacemaker in place with lead tips projecting over the right atrium and ventricle. Multi chamber cardiomegaly. Prominent main pulmonary artery at 3.3 cm. Small amount of fluid in the superior pericardial recess without significant pericardial effusion. Aortic atherosclerosis and tortuosity. No periaortic stranding. Mediastinum/Nodes: 14 mm subcarinal node series 2, image 66, previously 20 mm. 15 mm low right infrahilar node, series 2, image 84, unchanged. Additional scattered small mediastinal lymph nodes that are not enlarged by size criteria in similar to prior exam. Assessment for hilar adenopathy is significantly limited in the absence of IV contrast. Patulous esophagus with retained debris. Lungs/Pleura: Moderate size left pleural effusion has increased in size from prior imaging. Small amount of fluid tracks in the inter lobar fissure. Near complete consolidation of the left lower lobe. Postsurgical volume loss in the right  hemithorax. Chronic area of consolidation in the right middle lobe with air bronchograms, unchanged in appearance from prior imaging. Areas of ground-glass opacity in septal thickening in the right lower lobe, also chronic and similar in appearance to prior exam. Upper Abdomen: No acute upper abdominal findings. Musculoskeletal: Chronic C7 compression fracture. Chronic T7 compression fracture. Postsurgical change of the right upper hemithorax. Thoracic spondylosis with spurring. Pectus excavatum deformity. No focal bone lesions. The bones are diffusely under mineralized. IMPRESSION: 1. Moderate size left pleural effusion has increased in size from prior imaging. Near complete consolidation of the left lower lobe, favor pneumonia over compressive atelectasis. 2. Chronic area of consolidation in the right middle lobe with air bronchograms, unchanged in appearance from prior imaging. Scarring is favored. 3. Areas of ground-glass opacity and septal thickening in the right lower lobe are also chronic and similar in appearance to prior imaging. 4. Cardiomegaly. Prominent main pulmonary artery suggesting pulmonary arterial hypertension. 5. Patulous esophagus with retained debris. Aortic Atherosclerosis (ICD10-I70.0). Electronically Signed   By: Narda Rutherford M.D.   On: 07/04/2022 15:36   DG Chest Port 1 View  Result Date: 07/04/2022 CLINICAL DATA:  Shortness of breath EXAM: PORTABLE CHEST 1 VIEW COMPARISON:  10/25/2021, 08/12/2021 FINDINGS: Left-sided implanted cardiac device remains in place. Cardiomegaly. Aortic atherosclerosis. Moderate-sized left-sided pleural effusion  with associated left basilar opacity. No pneumothorax. Postsurgical changes of the upper right hemithorax. IMPRESSION: New moderate-sized left-sided pleural effusion with associated left basilar opacity. Electronically Signed   By: Duanne Guess D.O.   On: 07/04/2022 13:11    Assessment/Plan There are no diagnoses linked to this  encounter.   Family/ staff Communication: ***  Labs/tests ordered:  ***

## 2022-07-15 NOTE — Progress Notes (Signed)
Location:  [place] Room Number: NO/06/A Place of Service:  SNF (31) Provider:  Jahmeek Shirk X, NP [name], MD  Patient Care Team: [name], MD as PCP - General (Internal Medicine) Evans Lance, MD as PCP - Cardiology (Cardiology) Evans Lance, MD (Cardiology) Katy Apo, MD as Consulting Physician (Ophthalmology) Juanito Doom, MD as Consulting Physician (Pulmonary Disease)  Extended Emergency Contact Information Primary Emergency Contact: Francis Dowse Address: [address]          [place], [place] [address] Johnnette Litter of [place] Phone: [phone] Mobile Phone: [phone] Relation: Spouse Secondary Emergency Contact: [facility] Phone: [phone] Relation: Daughter Preferred language: English Interpreter needed? No  Code Status:  DNR Goals of care: Advanced Directive information    07/15/2022   11:40 AM  Advanced Directives  Does Patient Have a Medical Advance Directive? Yes  Type of Paramedic of [place];Out of facility DNR (pink MOST or yellow form)  Does patient want to make changes to medical advance directive? No - Patient declined  Copy of [place] in Chart? No - copy requested     Chief Complaint  Patient presents with   Acute Visit    Patient is being seen for end of life care    HPI:  Pt is a 88 y.o. male seen today for an acute visit for end of life care, pain and restlessness are not well controlled, no longer able to take food or meds by mouth.     Respiratory failure with hypoxia, O2 via [place] for comfort purpose  Bronchiectasis/COPE, chronic  Dysphagia, causing pulmonary aspiration with swallowing  Past Medical History:  Diagnosis Date   Abnormal CT of spine 10/24/2018   Per [place] new patient packet   Atrial fibrillation ([place])    Back pain    Per [place] new patient packet   Bradycardia    Per [place] new patient packet    Bronchiectasis [facility])    Per [place] new patient packet   COPD, mild ([place])    Per [place] new patient packet   Hx of colonoscopy 11/14/2007   Per [place] new patient packet   Hx of CT scan of chest 08/23/2018   Per [place] new patient packet   Hypothyroidism    Lung cancer [facility])    Per [place] new patient packet   Neck pain    Per [place] new patient packet   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[facility])    s/p XRT and surgical removal 1990   Pneumonia 2015   Symptomatic bradycardia    s/p PPM 1995   Past Surgical History:  Procedure Laterality Date   CATARACT EXTRACTION, BILATERAL     COLONOSCOPY  11/14/2007   Per [place] new patient packet   LUNG REMOVAL, PARTIAL     PACEMAKER [place] N/A 07/23/2018   Procedure: PPM GENERATOR CHANGEOUT;  Surgeon: Evans Lance, MD;  Location: [place] [facility] LAB;  Service: Cardiovascular;  Laterality: N/A;    No Known Allergies  Outpatient Encounter Medications as of 07/15/2022  Medication Sig   albuterol (PROVENTIL) (2.5 MG/3ML) 0.083% nebulizer solution USE 1 VIAL IN NEBULIZER EVERY 6 HOURS AS NEEDED FOR WHEEZING FOR SHORTNESS OF BREATH (Patient taking differently: Take 2.5 mg by nebulization every 6 (six) hours as needed for wheezing or shortness of breath.)   albuterol (VENTOLIN HFA) 108 (90 Base) MCG/ACT inhaler Inhale 2 puffs into the lungs every 6 (  six) hours as needed for wheezing or shortness of breath.   TRELEGY ELLIPTA 100-62.5-25 MCG/ACT AEPB Inhale 1 puff into the lungs daily.   [DISCONTINUED] amoxicillin-clavulanate (AUGMENTIN) 875-125 MG tablet Take 1 tablet by mouth 2 (two) times daily.   [DISCONTINUED] Cholecalciferol (VITAMIN D) 50 MCG (2000 UT) tablet Take 2,000 Units by mouth daily.   [DISCONTINUED] docusate sodium (COLACE) 100 MG capsule Take 100 mg by mouth 2 (two) times daily.   [DISCONTINUED] ferrous sulfate 325 (65 FE) MG EC tablet Take 325 mg by mouth daily.   [DISCONTINUED] furosemide (LASIX) 40 MG tablet Take 1 tablet (40 mg total)  by mouth daily.   [DISCONTINUED] guaiFENesin (MUCINEX) 600 MG 12 hr tablet Take 2 tablets (1,200 mg total) by mouth 2 (two) times daily.   [DISCONTINUED] HYDROcodone-acetaminophen (NORCO) 10-325 MG tablet 3 (three) times daily as needed.   [DISCONTINUED] levothyroxine (SYNTHROID) 125 MCG tablet TAKE 1 TABLET BY MOUTH EVERY DAY (Patient taking differently: Take 125 mcg by mouth daily before breakfast.)   [DISCONTINUED] Multiple Vitamin (MULTIVITAMIN) tablet Take 1 tablet by mouth daily.   [DISCONTINUED] psyllium (REGULOID) 0.52 g capsule Take 0.52 g by mouth daily. Fiber   [DISCONTINUED] SENNA CO 1-2 tablets by Combination route every evening.   [DISCONTINUED] vitamin C (ASCORBIC ACID) 500 MG tablet Take 500 mg by mouth daily.   [DISCONTINUED] XARELTO 15 MG TABS tablet TAKE 1 TABLET BY MOUTH EVERY DAY WITH SUPPER   No facility-administered encounter medications on file as of 07/15/2022.    Review of Systems  Unable to perform ROS: Patient nonverbal    Immunization History  Administered Date(s) Administered   Fluad Quad(high Dose 65+) 06/21/2022   Influenza Split 06/06/2017   Influenza, High Dose Seasonal PF 07/06/2019, 06/17/2020, 06/21/2021   Influenza,inj,quad, With Preservative 06/05/2018   PFIZER(Purple Top)SARS-COV-2 Vaccination 09/29/2019, 10/21/2019, 08/13/2020   Pfizer Covid-19 Vaccine Bivalent Booster 34yrs & up 01/18/2021, 08/05/2021, 05/29/2022   Pneumococcal Conjugate-13 06/15/2016   Pneumococcal-Unspecified 03/11/2019   Pertinent  Health Maintenance Due  Topic Date Due   INFLUENZA VACCINE  Completed      07/06/2022   12:00 PM 07/06/2022    9:40 PM 07/07/2022   10:00 AM 07/07/2022    9:46 PM 07/08/2022    7:35 AM  Fall Risk  Patient Fall Risk Level High fall risk High fall risk High fall risk High fall risk High fall risk   Functional Status Survey:    Vitals:   07/15/22 1138  BP: 112/65  Pulse: 77  Resp: 20  Temp: (!) 97.2 F (36.2 C)  SpO2: (!) 20%   Weight: 139 lb (63 kg)  Height: 6' (1.829 m)   Body mass index is 18.85 kg/m. Physical Exam Constitutional:      Comments: Grimace facial looks, non verbal.   HENT:     Head: Normocephalic and atraumatic.     Nose: Nose normal.     Mouth/Throat:     Mouth: Mucous membranes are dry.  Eyes:     Pupils: Pupils are equal, round, and reactive to light.  Cardiovascular:     Rate and Rhythm: Normal rate. Rhythm irregular.  Pulmonary:     Breath sounds: No wheezing.     Comments: O2 via [place], labored breathing, decreased air entry, chest wall pain when touched.  Chest:     Chest wall: Tenderness present.  Abdominal:     Palpations: Abdomen is soft.     Tenderness: There is no abdominal tenderness.  Musculoskeletal:  Cervical back: Normal range of motion and neck supple.     Right lower leg: No edema.     Left lower leg: No edema.  Skin:    General: Skin is warm and dry.  Neurological:     Mental Status: He is alert.  Psychiatric:     Comments: Grimaces when touched      Labs reviewed: Recent Labs    07/04/22 1406 07/05/22 0519 07/06/22 1219 07/07/22 0522  NA 137 140  --  141  K 3.8 3.9  --  3.8  CL 102 106  --  103  CO2 24 24  --  29  GLUCOSE 120* 139*  --  139*  BUN 37* 44*  --  48*  CREATININE 1.90* 1.92* 1.56* 1.59*  CALCIUM 9.0 9.0  --  9.2  MG  --   --   --  2.4  PHOS  --   --   --  3.5   Recent Labs    06/09/22 0713 07/04/22 1406 07/05/22 0519  AST 24 27 34  ALT 14 17 19   ALKPHOS  --  63 60  BILITOT 1.0 1.5* 1.0  PROT 7.5 8.0 7.7  ALBUMIN  --  3.4* 3.2*   Recent Labs    06/09/22 0713 07/04/22 1406 07/05/22 0519 07/07/22 0522  WBC 4.9 9.0 14.2* 10.3  NEUTROABS 2,362 7.9*  --  7.7  HGB 10.7* 11.3* 11.2* 12.0*  HCT 31.4* 35.0* 35.2* 37.5*  MCV 97.8 103.6* 104.5* 103.0*  PLT 119* 178 181 219   Lab Results  Component Value Date   TSH 1.35 06/09/2022   No results found for: "HGBA1C" Lab Results  Component Value Date   CHOL 144  08/11/2020   HDL 59 08/11/2020   LDLCALC 69 08/11/2020   TRIG 78 08/11/2020   CHOLHDL 2.4 08/11/2020    Significant Diagnostic Results in last 30 days:  DG Chest 1 View  Result Date: 07/07/2022 CLINICAL DATA:  Status post chest tube removal EXAM: PORTABLE CHEST 1 VIEW COMPARISON:  Film from earlier in the same day. FINDINGS: Left-sided pigtail catheter has been removed in the interval. Cardiac shadow remains enlarged. Pacing device is unchanged. Small left effusion and residual left basilar atelectasis are seen. Postsurgical changes are noted in the right apex stable from the prior exam. No recurrent left-sided pneumothorax is seen. IMPRESSION: Persistent left basilar atelectasis and effusion. No pneumothorax is noted following chest tube removal. Electronically Signed   By: Inez Catalina M.D.   On: 07/07/2022 11:50   DG Chest 1 View  Result Date: 07/07/2022 CLINICAL DATA:  Chest tube EXAM: CHEST  1 VIEW COMPARISON:  Chest 07/06/2022 FINDINGS: Pigtail chest tube in the left lung base unchanged. Small left basilar pneumothorax has resolved. No residual pneumothorax identified. Left lower lobe atelectasis and small effusion unchanged Cardiac enlargement.  Dual lead pacemaker unchanged. Postsurgical changes right upper lobe with resection of upper ribs. Lung appears to be protruding through the defect into the right lower neck unchanged. Right lung is clear. IMPRESSION: 1. Resolution of left pneumothorax. Left chest tube remains in place. 2. Left lower lobe atelectasis and small effusion unchanged. Electronically Signed   By: Franchot Gallo M.D.   On: 07/07/2022 08:30   DG Chest 1 View  Result Date: 07/06/2022 CLINICAL DATA:  Left chest tube placement EXAM: CHEST  1 VIEW COMPARISON:  Chest x-ray 07/04/2022 FINDINGS: Very small left hydropneumothorax present inferiorly. New left pleural drainage catheter is noted inferiorly. Left pleural  effusion has nearly completely resolved. There is left basilar  atelectasis. Postsurgical changes in the right lung apex appear unchanged. Cardiac silhouette is enlarged, unchanged. Left-sided pacemaker is stable in position. No acute fractures. IMPRESSION: Small inferior left hydropneumothorax following left pleural drainage catheter placement. Left pleural effusion has nearly completely resolved. Electronically Signed   By: Ronney Asters M.D.   On: 07/06/2022 18:11   DG ESOPHAGUS W SINGLE CM (SOL OR THIN BA)  Result Date: 07/05/2022 CLINICAL DATA:  Dysphagia EXAM: ESOPHOGRAM/BARIUM SWALLOW TECHNIQUE: Single contrast examination was performed using  thin barium. FLUOROSCOPY: Radiation Exposure Index (as provided by the fluoroscopic device): 3.6 mGy Kerma COMPARISON:  None Available. FINDINGS: Limited upright LPO single contrast esophagram. There is tortuosity of the upper thoracic esophagus. There is severe esophageal dysmotility. There is pooling of contrast in the proximal esophagus with significant delayed passage into the distal esophagus and stomach. No aspiration occurred during the exam. A 13 mm barium tablet was not given. Postoperative changes of the right lower neck and upper right ribs noted. IMPRESSION: Severe esophageal dysmotility. Electronically Signed   By: Maurine Simmering M.D.   On: 07/05/2022 15:34   DG CHEST PORT 1 VIEW  Result Date: 07/05/2022 CLINICAL DATA:  Provided history: Status post thoracentesis. EXAM: PORTABLE CHEST 1 VIEW COMPARISON:  Prior chest radiographs 07/04/2022 and earlier. Chest CT 07/04/2022. FINDINGS: Left chest dual lead pacer device. Cardiomegaly, unchanged. Aortic atherosclerosis. Persistent small left pleural effusion, decreased in size from the prior examination of 07/04/2022. Associated left basilar atelectasis and/or consolidation. Focus of chronic consolidation within the right middle lobe, more fully characterized on yesterday's chest CT. No evidence of pneumothorax. No acute bony abnormality identified. IMPRESSION: No  evidence of pneumothorax status post thoracentesis. Small left pleural effusion, decreased in size. Associated left basilar atelectasis and/or consolidation. Focus of chronic consolidation within the right middle lobe, more fully characterized on yesterday's chest CT. Please refer to this prior examination for further description. Cardiomegaly. Aortic Atherosclerosis (ICD10-I70.0). Electronically Signed   By: Kellie Simmering D.O.   On: 07/05/2022 11:21   ECHOCARDIOGRAM COMPLETE  Result Date: 07/05/2022    ECHOCARDIOGRAM REPORT   Patient Name:   Lisandro Meggett. Date of Exam: 07/05/2022 Medical Rec #:  [id number]           Height:       72.0 in Accession #:    [id number]          Weight:       163.4 lb Date of Birth:  [birth year]           BSA:          1.955 m Patient Age:    34 years            BP:           120/70 mmHg Patient Gender: M                   HR:           66 bpm. Exam Location:  Inpatient Procedure: 2D Echo                                 MODIFIED REPORT: This report was modified by Rudean Haskell MD on 07/05/2022 due to comment  on study quality/difficulty.  Indications:     CHF  History:         Patient has no prior history of Echocardiogram examinations.                  CHF, COPD, Arrythmias:Atrial Fibrillation; Risk                  Factors:Dyslipidemia.  Sonographer:     Harvie Junior Referring Phys:  [id number] Jonnie Finner Diagnosing Phys: Rudean Haskell MD  Sonographer Comments: Technically difficult study due to poor echo windows. Image acquisition challenging due to respiratory motion and Image acquisition challenging due to COPD. IMPRESSIONS  1. Left ventricular ejection fraction, by estimation, is 50 to 55%. The left ventricle has low normal function. The left ventricle has no regional wall motion abnormalities. Left ventricular diastolic parameters are indeterminate.  2. Right ventricular systolic function is normal. The right ventricular size is  normal. A device lead not well visualized in this study is visualized. There is normal pulmonary artery systolic pressure.  3. Left atrial size was mildly dilated.  4. The mitral valve is normal in structure. No evidence of mitral valve regurgitation. No evidence of mitral stenosis.  5. The aortic valve was not well visualized. Aortic valve regurgitation is not visualized. No aortic stenosis is present.  6. Technically difficult study. Comparison(s): No prior Echocardiogram. FINDINGS  Left Ventricle: Left ventricular ejection fraction, by estimation, is 50 to 55%. The left ventricle has low normal function. The left ventricle has no regional wall motion abnormalities. The left ventricular internal cavity size was normal in size. There is no left ventricular hypertrophy. Left ventricular diastolic parameters are indeterminate. Right Ventricle: The right ventricular size is normal. No increase in right ventricular wall thickness. Right ventricular systolic function is normal. There is normal pulmonary artery systolic pressure. The tricuspid regurgitant velocity is 2.39 m/s, and  with an assumed right atrial pressure of 3 mmHg, the estimated right ventricular systolic pressure is 97.9 mmHg. Left Atrium: Left atrial size was mildly dilated. Right Atrium: Right atrial size was normal in size. Pericardium: There is no evidence of pericardial effusion. Mitral Valve: The mitral valve is normal in structure. No evidence of mitral valve regurgitation. No evidence of mitral valve stenosis. Tricuspid Valve: The tricuspid valve is normal in structure. Tricuspid valve regurgitation is not demonstrated. No evidence of tricuspid stenosis. Aortic Valve: The aortic valve was not well visualized. Aortic valve regurgitation is not visualized. No aortic stenosis is present. Aortic valve mean gradient measures 4.0 mmHg. Aortic valve peak gradient measures 6.9 mmHg. Aortic valve area, by VTI measures 2.96 cm. Pulmonic Valve: The pulmonic  valve was not well visualized. Pulmonic valve regurgitation is not visualized. Aorta: The aortic root and ascending aorta are structurally normal, with no evidence of dilitation. IAS/Shunts: No atrial level shunt detected by color flow Doppler. Additional Comments: A device lead not well visualized in this study is visualized.  LEFT VENTRICLE PLAX 2D LVIDd:         4.10 cm     Diastology LVIDs:         2.80 cm     LV e' medial:    6.53 cm/s LV PW:         0.80 cm     LV E/e' medial:  15.8 LV IVS:        0.80 cm     LV e' lateral:   7.40 cm/s LVOT diam:  2.10 cm     LV E/e' lateral: 13.9 LV SV:         63 LV SV Index:   32 LVOT Area:     3.46 cm  LV Volumes (MOD) LV vol d, MOD A2C: 68.5 ml LV vol d, MOD A4C: 94.8 ml LV vol s, MOD A2C: 28.0 ml LV vol s, MOD A4C: 44.7 ml LV SV MOD A2C:     40.5 ml LV SV MOD A4C:     94.8 ml LV SV MOD BP:      45.3 ml RIGHT VENTRICLE RV Basal diam:  3.30 cm RV Mid diam:    2.70 cm TAPSE (M-mode): 2.4 cm LEFT ATRIUM             Index        RIGHT ATRIUM           Index LA diam:        2.90 cm 1.48 cm/m   RA Area:     31.50 cm LA Vol (A2C):   74.2 ml 37.95 ml/m  RA Volume:   119.00 ml 60.87 ml/m LA Vol (A4C):   66.4 ml 33.96 ml/m LA Biplane Vol: 71.6 ml 36.62 ml/m  AORTIC VALVE                    PULMONIC VALVE AV Area (Vmax):    2.16 cm     PV Vmax:       0.87 m/s AV Area (Vmean):   2.41 cm     PV Peak grad:  3.0 mmHg AV Area (VTI):     2.96 cm AV Vmax:           131.00 cm/s AV Vmean:          89.400 cm/s AV VTI:            0.214 m AV Peak Grad:      6.9 mmHg AV Mean Grad:      4.0 mmHg LVOT Vmax:         81.60 cm/s LVOT Vmean:        62.200 cm/s LVOT VTI:          0.183 m LVOT/AV VTI ratio: 0.86  AORTA Ao Root diam: 3.40 cm Ao Asc diam:  2.70 cm MITRAL VALVE                TRICUSPID VALVE MV Area (PHT): 4.60 cm     TR Peak grad:   22.8 mmHg MV Decel Time: 165 msec     TR Vmax:        239.00 cm/s MR Peak grad: 28.1 mmHg MR Vmax:      265.00 cm/s   SHUNTS MV E velocity:  103.00 cm/s  Systemic VTI:  0.18 m MV A velocity: 38.10 cm/s   Systemic Diam: 2.10 cm MV E/A ratio:  2.70 Rudean Haskell MD Electronically signed by Rudean Haskell MD Signature Date/Time: 07/05/2022/11:10:30 AM    Final (Updated)    US RENAL  Result Date: 07/04/2022 CLINICAL DATA:  30 year old with acute kidney injury. EXAM: RENAL / URINARY TRACT ULTRASOUND COMPLETE COMPARISON:  None Available. FINDINGS: Right Kidney: Renal measurements: 10.6 x 4.2 x 4.7 cm = volume: 107 mL. Normal parenchymal echogenicity. No hydronephrosis. No visualized stone or focal lesion. Left Kidney: Renal measurements: 10.3 x 4.9 x 5 cm = volume: 132 mL. Normal parenchymal echogenicity. No hydronephrosis. No visualized stone or focal lesion. Bladder: Appears normal for degree of  bladder distention. Other: None. IMPRESSION: No obstructive uropathy. No acute findings or explanation for acute kidney injury. Electronically Signed   By: Keith Rake M.D.   On: 07/04/2022 21:10   CT Chest Wo Contrast  Result Date: 07/04/2022 CLINICAL DATA:  Pneumonia, complication suspected, xray done EXAM: CT CHEST WITHOUT CONTRAST TECHNIQUE: Multidetector CT imaging of the chest was performed following the standard protocol without IV contrast. RADIATION DOSE REDUCTION: This exam was performed according to the departmental dose-optimization program which includes automated exposure control, adjustment of the mA and/or kV according to patient size and/or use of iterative reconstruction technique. COMPARISON:  Radiograph earlier today. PET CT 10/25/2021, chest CT 09/10/2021 FINDINGS: Cardiovascular: Left-sided pacemaker in place with lead tips projecting over the right atrium and ventricle. Multi chamber cardiomegaly. Prominent main pulmonary artery at 3.3 cm. Small amount of fluid in the superior pericardial recess without significant pericardial effusion. Aortic atherosclerosis and tortuosity. No periaortic stranding.  Mediastinum/Nodes: 14 mm subcarinal node series 2, image 66, previously 20 mm. 15 mm low right infrahilar node, series 2, image 84, unchanged. Additional scattered small mediastinal lymph nodes that are not enlarged by size criteria in similar to prior exam. Assessment for hilar adenopathy is significantly limited in the absence of IV contrast. Patulous esophagus with retained debris. Lungs/Pleura: Moderate size left pleural effusion has increased in size from prior imaging. Small amount of fluid tracks in the inter lobar fissure. Near complete consolidation of the left lower lobe. Postsurgical volume loss in the right hemithorax. Chronic area of consolidation in the right middle lobe with air bronchograms, unchanged in appearance from prior imaging. Areas of ground-glass opacity in septal thickening in the right lower lobe, also chronic and similar in appearance to prior exam. Upper Abdomen: No acute upper abdominal findings. Musculoskeletal: Chronic C7 compression fracture. Chronic T7 compression fracture. Postsurgical change of the right upper hemithorax. Thoracic spondylosis with spurring. Pectus excavatum deformity. No focal bone lesions. The bones are diffusely under mineralized. IMPRESSION: 1. Moderate size left pleural effusion has increased in size from prior imaging. Near complete consolidation of the left lower lobe, favor pneumonia over compressive atelectasis. 2. Chronic area of consolidation in the right middle lobe with air bronchograms, unchanged in appearance from prior imaging. Scarring is favored. 3. Areas of ground-glass opacity and septal thickening in the right lower lobe are also chronic and similar in appearance to prior imaging. 4. Cardiomegaly. Prominent main pulmonary artery suggesting pulmonary arterial hypertension. 5. Patulous esophagus with retained debris. Aortic Atherosclerosis (ICD10-I70.0). Electronically Signed   By: Keith Rake M.D.   On: 07/04/2022 15:36   DG Chest Port 1  View  Result Date: 07/04/2022 CLINICAL DATA:  Shortness of breath EXAM: PORTABLE CHEST 1 VIEW COMPARISON:  10/25/2021, 08/12/2021 FINDINGS: Left-sided implanted cardiac device remains in place. Cardiomegaly. Aortic atherosclerosis. Moderate-sized left-sided pleural effusion with associated left basilar opacity. No pneumothorax. Postsurgical changes of the upper right hemithorax. IMPRESSION: New moderate-sized left-sided pleural effusion with associated left basilar opacity. Electronically Signed   By: Davina Poke D.O.   On: 07/04/2022 13:11    Assessment/Plan Acute respiratory failure with hypoxia (HCC) O2 via [place] for comfort purpose, Morphine 5mg  q2hr and q1hr prn po/sl, Lorazepam 0.5mg  q2hr prn available to him.      Family/ staff Communication: plan of care reviewed with the patent, the patient's wife and son, Hospice nurse, charge nurse  Labs/tests ordered:  none  Time spend 35 minutes.

## 2022-07-15 NOTE — Progress Notes (Signed)
This encounter was created in error - please disregard.

## 2022-07-15 NOTE — Assessment & Plan Note (Signed)
O2 via [place] for comfort purpose, Morphine 5mg  q2hr and q1hr prn po/sl, Lorazepam 0.5mg  q2hr prn available to him.

## 2022-07-15 NOTE — Assessment & Plan Note (Signed)
Chronic, progressing in setting of dysphagia, causing pulmonary aspiration with swallowing

## 2022-07-18 ENCOUNTER — Telehealth: Payer: Self-pay

## 2022-07-18 ENCOUNTER — Ambulatory Visit: Payer: BC Managed Care – PPO | Admitting: Pulmonary Disease

## 2022-07-18 NOTE — Telephone Encounter (Signed)
Pt wife called stating the patient has passed away. I cancelled all upcoming [facility] appointments, marked patient deceased in [place] and took the patient out of [facility]. Return kit ordered.

## 2022-07-19 ENCOUNTER — Other Ambulatory Visit: Payer: Medicare Other

## 2022-07-23 IMAGING — CT CT T SPINE W/O CM
1 series · 12 of 14 positions shown, 15 images · non-contrast
Comparison: CT chest 08/23/2018

CLINICAL DATA: Thoracic region back pain over the last month.
History of lung cancer.

EXAM:
CT THORACIC SPINE WITHOUT CONTRAST
TECHNIQUE: Multidetector CT images of the thoracic were obtained using the
standard protocol without intravenous contrast.

[Series 3: t spine soft · axial · 0.43mm/px · z∈[-428,-131]mm · 12 of 118 slices shown, 15 images]
[im 10/118  soft-tissue]
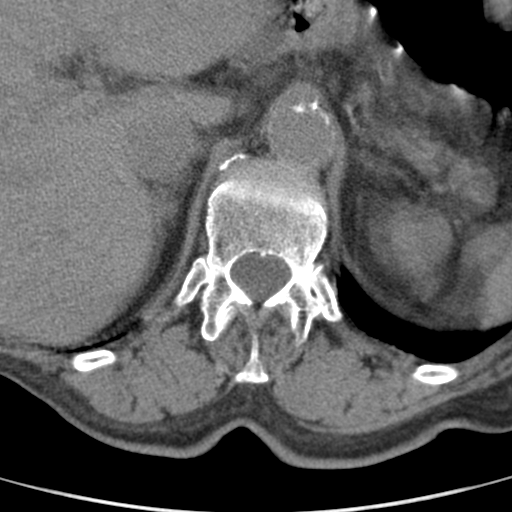
[im 10/118  bone]
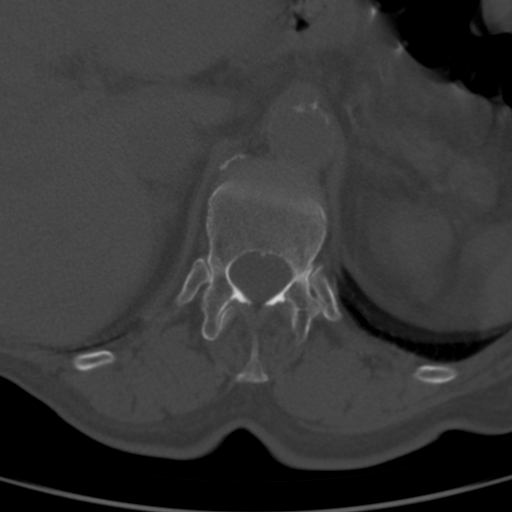
[im 19/118  bone]
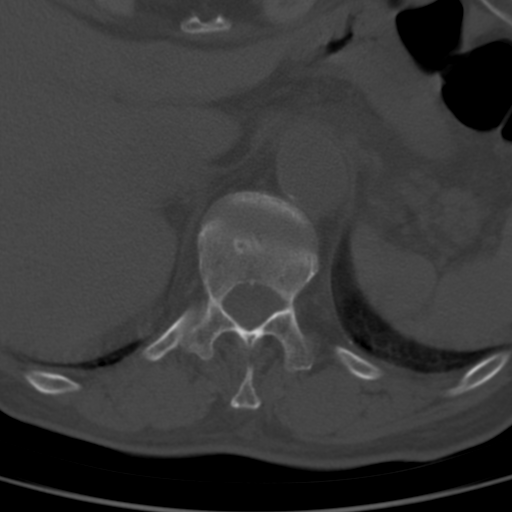
[im 28/118  bone]
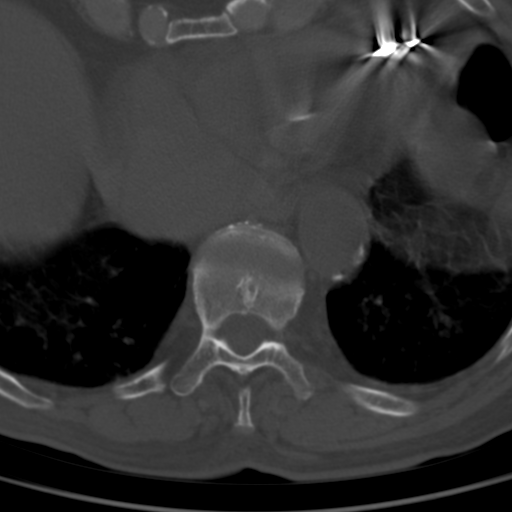
[im 37/118  bone]
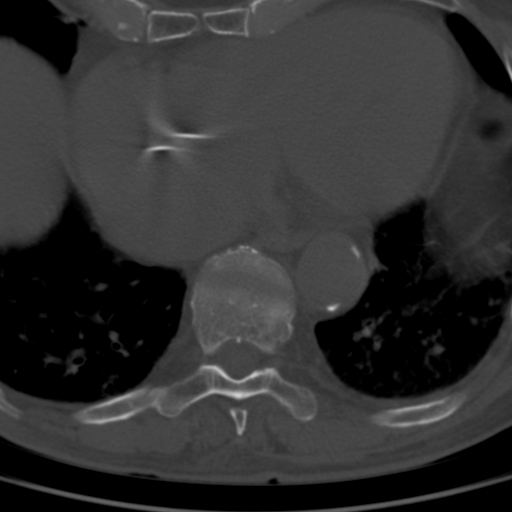
[im 46/118  soft-tissue]
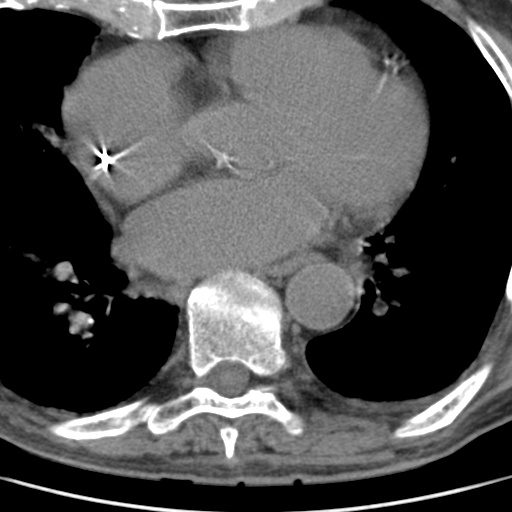
[im 46/118  bone]
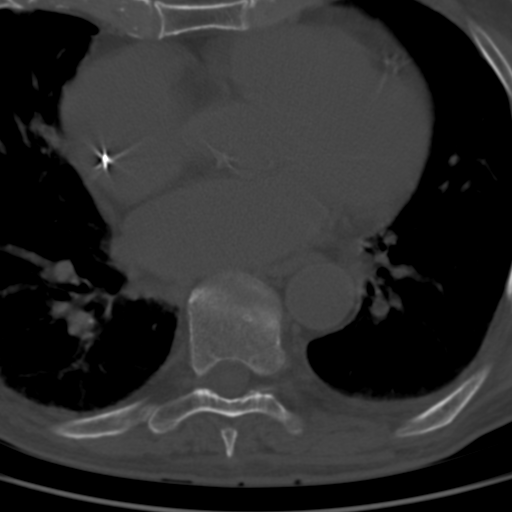
[im 55/118  bone]
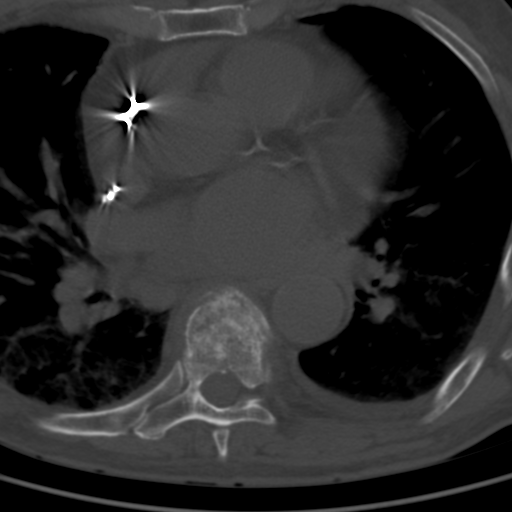
[im 64/118  bone]
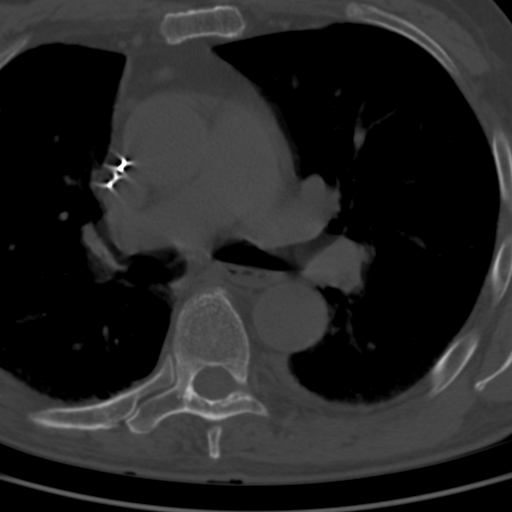
[im 73/118  bone]
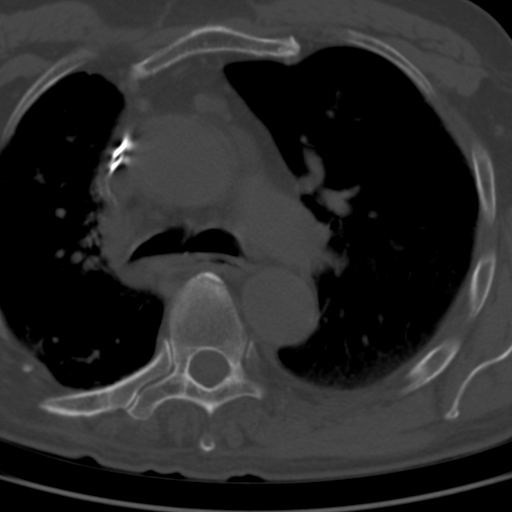
[im 82/118  soft-tissue]
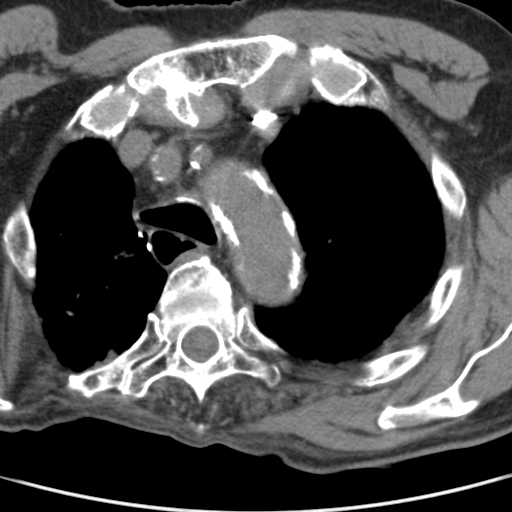
[im 82/118  bone]
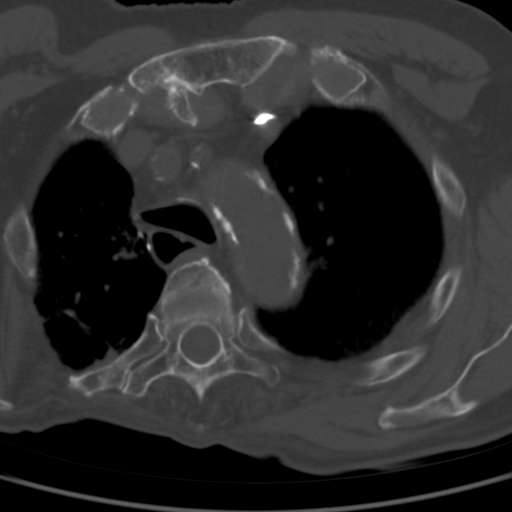
[im 91/118  bone]
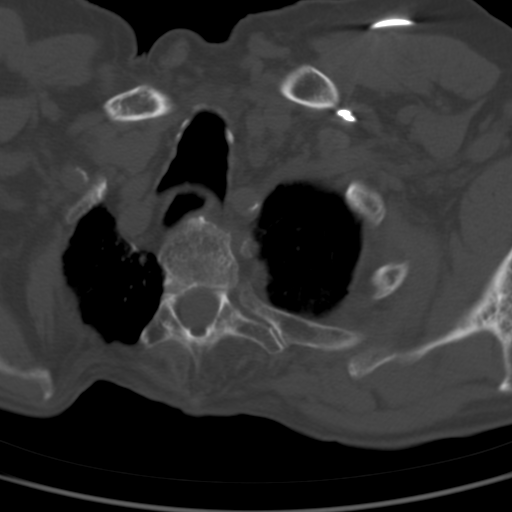
[im 100/118  bone]
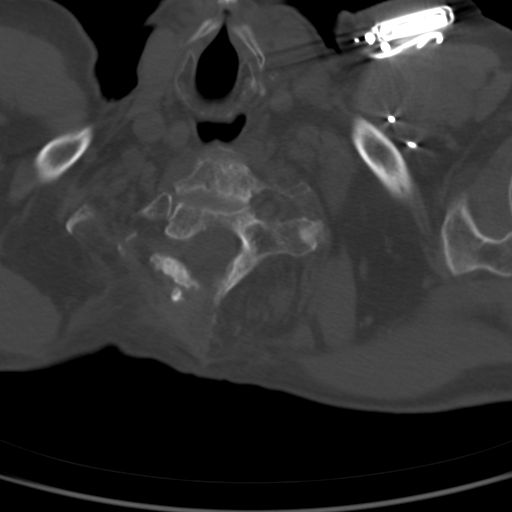
[im 109/118  bone]
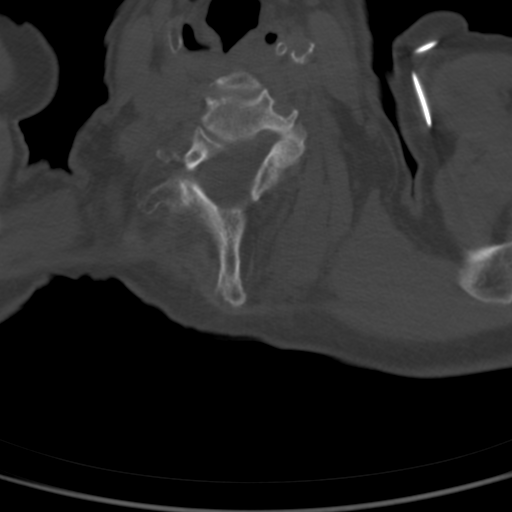

[12 of 14 positions shown; findings below may reference images not displayed]

FINDINGS: Alignment: No malalignment.

Vertebrae: Old compression deformity at C7 which is healed. Old
minor superior endplate fracture at T2 which is healed. Newly seen
compression fracture at T7 with loss of height 60%. Mild posterior
bowing of the posteroinferior margin of the vertebral body but no
apparent bony encroachment upon the neural structures. No finding to
suggest that this is a pathologic fracture, but that is not
absolutely excluded. No other regional fracture. No suspicious bone
lesion at any other level. There is some chronic deformity of the
posterior elements at T2, present on the study of 3127 and not
likely related to active disease.

Paraspinal and other soft tissues: Negative except for minimal
amount pleural fluid on the left.

Disc levels: No significant disc disease suspected. No apparent
compressive stenosis of the canal or foramina.
IMPRESSION: 1. Newly seen/recent compression fracture at T7 with loss of height
of 60%. Mild posterior bowing of the posteroinferior margin of the
vertebral body but no apparent bony encroachment upon the neural
structures. No finding to suggest that this is a pathologic
fracture, but that is not absolutely excluded.
2. Old compression fractures at C7 and T2. Chronic deformity of the
posterior elements at T2, present on the study of 3127 and not
likely related to active disease.

## 2022-07-27 ENCOUNTER — Encounter: Payer: Medicare Other | Admitting: Internal Medicine

## 2022-07-27 ENCOUNTER — Inpatient Hospital Stay: Payer: BC Managed Care – PPO | Admitting: Pulmonary Disease

## 2022-08-05 LAB — FUNGAL ORGANISM REFLEX

## 2022-08-05 LAB — FUNGUS CULTURE RESULT

## 2022-08-05 LAB — FUNGUS CULTURE WITH STAIN

## 2022-08-05 DEATH — deceased

## 2022-08-19 LAB — ACID FAST CULTURE WITH REFLEXED SENSITIVITIES (MYCOBACTERIA): Acid Fast Culture: NEGATIVE
